# Patient Record
Sex: Male | Born: 1940 | Race: Black or African American | Hispanic: No | Marital: Married | Smoking: Never smoker
Health system: Southern US, Community
[De-identification: ages and names within clinical notes are randomized; demographics above are authoritative.]

## PROBLEM LIST (undated history)

## (undated) DIAGNOSIS — I517 Cardiomegaly: Secondary | ICD-10-CM

## (undated) DIAGNOSIS — R011 Cardiac murmur, unspecified: Secondary | ICD-10-CM

## (undated) DIAGNOSIS — K219 Gastro-esophageal reflux disease without esophagitis: Secondary | ICD-10-CM

## (undated) DIAGNOSIS — M1990 Unspecified osteoarthritis, unspecified site: Secondary | ICD-10-CM

## (undated) DIAGNOSIS — I10 Essential (primary) hypertension: Secondary | ICD-10-CM

## (undated) DIAGNOSIS — I5189 Other ill-defined heart diseases: Secondary | ICD-10-CM

## (undated) DIAGNOSIS — I340 Nonrheumatic mitral (valve) insufficiency: Secondary | ICD-10-CM

## (undated) DIAGNOSIS — I371 Nonrheumatic pulmonary valve insufficiency: Secondary | ICD-10-CM

## (undated) DIAGNOSIS — M109 Gout, unspecified: Secondary | ICD-10-CM

## (undated) DIAGNOSIS — E785 Hyperlipidemia, unspecified: Secondary | ICD-10-CM

## (undated) DIAGNOSIS — R251 Tremor, unspecified: Secondary | ICD-10-CM

## (undated) HISTORY — DX: Essential (primary) hypertension: I10

## (undated) HISTORY — DX: Hyperlipidemia, unspecified: E78.5

## (undated) HISTORY — DX: Tremor, unspecified: R25.1

## (undated) HISTORY — DX: Nonrheumatic mitral (valve) insufficiency: I34.0

## (undated) HISTORY — DX: Cardiomegaly: I51.7

## (undated) HISTORY — PX: ANAL FISSURECTOMY: SUR608

## (undated) HISTORY — DX: Gout, unspecified: M10.9

## (undated) HISTORY — DX: Unspecified osteoarthritis, unspecified site: M19.90

## (undated) HISTORY — DX: Nonrheumatic pulmonary valve insufficiency: I37.1

## (undated) HISTORY — DX: Other ill-defined heart diseases: I51.89

## (undated) HISTORY — DX: Cardiac murmur, unspecified: R01.1

---

## 2012-01-08 ENCOUNTER — Ambulatory Visit: Payer: Self-pay | Admitting: Family Medicine

## 2012-01-08 LAB — CREATININE, SERUM
Creatinine: 1 mg/dL (ref 0.60–1.30)
EGFR (African American): >60
EGFR (Non-African Amer.): >60

## 2013-07-31 ENCOUNTER — Ambulatory Visit (INDEPENDENT_AMBULATORY_CARE_PROVIDER_SITE_OTHER): Payer: Medicare Other | Admitting: Podiatry

## 2013-07-31 ENCOUNTER — Encounter: Payer: Self-pay | Admitting: Podiatry

## 2013-07-31 VITALS — BP 182/89 | HR 69 | Resp 16 | Ht 72.0 in | Wt 225.0 lb

## 2013-07-31 DIAGNOSIS — M7660 Achilles tendinitis, unspecified leg: Secondary | ICD-10-CM

## 2013-07-31 NOTE — Progress Notes (Signed)
Mr. Cansler presents today as a 72 year old very active black male for followup of his tendo Achilles tendinitis. He states that where we do we should not take him off the Voltaren gel. He states that this helps considerably the only side effects he gets is some sweating. He states that he has not tried the elliptical machine yet but he is very active at the gym. Denies any trauma. States that the Achilles is almost 100% better.  Objective: Vital signs are stable he is alert and oriented x3. Pulses remain palpable left lower extremity. No warmth and no tenderness on palpation of the tendo Achilles left heel. This is much improved.  Assessment: Well-healing Achilles tendinitis left.  Plan: Discussed etiology pathology conservative versus surgical therapies. He will continue the use of his new balance tennis shoes. He will continue the use of his Voltaren gel. He will followup with [place] on an as-needed basis.

## 2013-08-02 ENCOUNTER — Encounter: Payer: Self-pay | Admitting: Podiatry

## 2014-06-11 ENCOUNTER — Other Ambulatory Visit: Payer: Self-pay | Admitting: *Deleted

## 2014-06-11 MED ORDER — DICLOFENAC SODIUM 1 % TD GEL: 4.0000 g | Freq: Four times a day (QID) | TRANSDERMAL | Status: DC

## 2014-06-11 NOTE — Telephone Encounter (Signed)
Per dr Milinda Pointer refill voltaren gel 1% one tube with 1 refill apply to affected area 4 times daily.

## 2015-03-04 ENCOUNTER — Other Ambulatory Visit: Payer: Self-pay | Admitting: Emergency Medicine

## 2015-03-04 ENCOUNTER — Telehealth: Payer: Self-pay | Admitting: Family Medicine

## 2015-03-04 DIAGNOSIS — M5430 Sciatica, unspecified side: Secondary | ICD-10-CM

## 2015-03-04 MED ORDER — GABAPENTIN 300 MG PO CAPS: 300.0000 mg | ORAL_CAPSULE | Freq: Three times a day (TID) | ORAL | Status: DC

## 2015-03-04 NOTE — Telephone Encounter (Signed)
Ok to rf gabapentin for 3 mo. 1 rf

## 2015-03-04 NOTE — Telephone Encounter (Signed)
RX sent to mail order pharmacy with 3 refills

## 2015-03-04 NOTE — Telephone Encounter (Signed)
Look in allscript patient has not had Gabapentin in 1 year. Do you want to refill or will he need follow up appointment

## 2015-03-04 NOTE — Telephone Encounter (Signed)
Pt is requesting a refill on gabapentin 300mg . He would like it to be sent into [facility].

## 2015-04-08 ENCOUNTER — Ambulatory Visit: Payer: Self-pay | Admitting: Family Medicine

## 2015-05-13 ENCOUNTER — Other Ambulatory Visit: Payer: Self-pay | Admitting: Family Medicine

## 2015-07-17 ENCOUNTER — Other Ambulatory Visit: Payer: Self-pay | Admitting: Family Medicine

## 2015-07-26 ENCOUNTER — Other Ambulatory Visit: Payer: Self-pay | Admitting: Family Medicine

## 2015-10-25 ENCOUNTER — Other Ambulatory Visit: Payer: Self-pay | Admitting: Family Medicine

## 2015-11-08 ENCOUNTER — Other Ambulatory Visit: Payer: Self-pay | Admitting: Family Medicine

## 2015-11-08 ENCOUNTER — Telehealth: Payer: Self-pay | Admitting: Family Medicine

## 2015-11-08 NOTE — Telephone Encounter (Signed)
NEEDS GAYBAPENTIN REFILLED. TOLD HEM HE MAY HAVE TO BE SEEN FIRST SINCE HE HAS NOT BEEN HERE SINCE 03/2015. PHARM IS [facility].

## 2015-11-11 MED ORDER — GABAPENTIN 300 MG PO CAPS: 300.0000 mg | ORAL_CAPSULE | Freq: Three times a day (TID) | ORAL | Status: DC

## 2015-11-11 NOTE — Telephone Encounter (Signed)
LFT MESS TO CALL ABOUT MEDICATION AND TO [facility] AN APPT WITH MORRISEY WHEN HE RETURNS

## 2015-11-11 NOTE — Telephone Encounter (Signed)
Pt coming in on April 3 for appt.

## 2015-11-11 NOTE — Telephone Encounter (Signed)
Refill has been sent to pharmacy. Please schedule follow up appointment for when [facility] returns

## 2015-11-11 NOTE — Telephone Encounter (Signed)
We can give one month supply until he can return for follow up

## 2015-12-20 ENCOUNTER — Other Ambulatory Visit: Payer: Self-pay | Admitting: Family Medicine

## 2015-12-20 MED ORDER — GABAPENTIN 300 MG PO CAPS: 300.0000 mg | ORAL_CAPSULE | Freq: Three times a day (TID) | ORAL | Status: DC

## 2015-12-20 NOTE — Telephone Encounter (Signed)
Dr Rutherford Nail Patient: requesting refill on Gabapentin 300mg  please send to [facility] [address]. Patient has only one day left.

## 2015-12-20 NOTE — Telephone Encounter (Signed)
Refill request was sent to Dr. Krichna Sowles for approval and submission.  

## 2015-12-23 ENCOUNTER — Other Ambulatory Visit: Payer: Self-pay | Admitting: Family Medicine

## 2015-12-23 ENCOUNTER — Ambulatory Visit: Payer: Self-pay | Admitting: Family Medicine

## 2015-12-23 ENCOUNTER — Other Ambulatory Visit: Payer: Self-pay

## 2015-12-23 MED ORDER — GABAPENTIN 300 MG PO CAPS: 300.0000 mg | ORAL_CAPSULE | Freq: Three times a day (TID) | ORAL | Status: DC

## 2015-12-25 ENCOUNTER — Ambulatory Visit (INDEPENDENT_AMBULATORY_CARE_PROVIDER_SITE_OTHER): Payer: Medicare Other | Admitting: Family Medicine

## 2015-12-25 ENCOUNTER — Encounter: Payer: Self-pay | Admitting: Family Medicine

## 2015-12-25 VITALS — BP 160/78 | HR 67 | Temp 98.2°F | Resp 18 | Ht 72.0 in | Wt 238.4 lb

## 2015-12-25 DIAGNOSIS — M5441 Lumbago with sciatica, right side: Secondary | ICD-10-CM | POA: Diagnosis not present

## 2015-12-25 DIAGNOSIS — G8929 Other chronic pain: Secondary | ICD-10-CM

## 2015-12-25 DIAGNOSIS — I10 Essential (primary) hypertension: Secondary | ICD-10-CM | POA: Diagnosis not present

## 2015-12-25 MED ORDER — GABAPENTIN 300 MG PO CAPS: 300.0000 mg | ORAL_CAPSULE | Freq: Three times a day (TID) | ORAL | Status: DC

## 2015-12-25 MED ORDER — LOSARTAN POTASSIUM 100 MG PO TABS: 100.0000 mg | ORAL_TABLET | Freq: Every day | ORAL | Status: DC

## 2015-12-25 MED ORDER — AMLODIPINE BESYLATE 2.5 MG PO TABS: 2.5000 mg | ORAL_TABLET | Freq: Every day | ORAL | Status: DC

## 2015-12-25 NOTE — Progress Notes (Signed)
Name: Jesus Dillon   MRN: [id number]    DOB: 20-Jun-1941   Date:12/25/2015       Progress Note  Subjective  Chief Complaint  Chief Complaint  Patient presents with  . Medication Refill  . Back Pain    Takes Gabapentin for sciatica and it has helped.     HPI  Low Back Pain: Pt. Presents for medication refill. He is on Gabapentin for chronic right low back pain with radiation down the right leg. He was started on Gabapentin 300 mg three times daily by Dr. Rutherford Nail, which usually helps relieve the pain.  Review his records including MRI of lumbar spine from 2010 reviewed and discussed with patient in detail.    Past Medical History  Diagnosis Date  . HBP (high blood pressure)   . Gout   . Tremors of nervous system     No past surgical history on file.  No family history on file.  Social History   Social History  . Marital Status: Married    Spouse Name: N/A  . Number of Children: N/A  . Years of Education: N/A   Occupational History  . Not on file.   Social History Main Topics  . Smoking status: Never Smoker   . Smokeless tobacco: Never Used  . Alcohol Use: No  . Drug Use: No  . Sexual Activity:    Partners: Female   Other Topics Concern  . Not on file   Social History Narrative     Current outpatient prescriptions:  .  allopurinol (ZYLOPRIM) 300 MG tablet, TAKE 1 TABLET DAILY, Disp: 90 tablet, Rfl: 1 .  atorvastatin (LIPITOR) 10 MG tablet, 10 mg. TAKE ONE TABLET EVERY OTHER DAY, Disp: , Rfl:  .  diclofenac sodium (VOLTAREN) 1 % GEL, Apply 4 g topically 4 (four) times daily., Disp: 1 Tube, Rfl: 11 .  gabapentin (NEURONTIN) 300 MG capsule, Take 1 capsule (300 mg total) by mouth 3 (three) times daily., Disp: 90 capsule, Rfl: 2 .  losartan (COZAAR) 100 MG tablet, Take 1 tablet (100 mg total) by mouth daily., Disp: 90 tablet, Rfl: 0 .  propranolol (INDERAL) 10 MG tablet, TAKE 1 TABLET DAILY, Disp: 90 tablet, Rfl: 0 .  amLODipine (NORVASC) 2.5 MG tablet, Take 1  tablet (2.5 mg total) by mouth daily., Disp: 30 tablet, Rfl: 0  Allergies  Allergen Reactions  . Penicillins Rash     Review of Systems  Eyes: Negative for blurred vision and double vision.  Cardiovascular: Negative for chest pain.  Musculoskeletal: Positive for back pain and joint pain (right knee pain).  Neurological: Positive for dizziness (occasional dizziness especially on standing up too fast.). Negative for headaches.     Objective  Filed Vitals:   12/25/15 1428 12/25/15 1451  BP: 174/78 160/78  Pulse: 67   Temp: 98.2 F (36.8 C)   TempSrc: Oral   Resp: 18   Height: 6' (1.829 m)   Weight: 238 lb 6.4 oz (108.138 kg)   SpO2: 98%     Physical Exam  Constitutional: He is oriented to person, place, and time and well-developed, well-nourished, and in no distress.  Cardiovascular: Normal rate and regular rhythm.   Pulmonary/Chest: Effort normal and breath sounds normal.  Musculoskeletal:       Lumbar back: He exhibits tenderness and pain.       Back:  Neurological: He is alert and oriented to person, place, and time.  Nursing note and vitals reviewed.  Assessment & Plan  1. Essential hypertension Blood pressure is persistently elevated, will add amlodipine to his regimen. Recheck BP in one month - amLODipine (NORVASC) 2.5 MG tablet; Take 1 tablet (2.5 mg total) by mouth daily.  Dispense: 30 tablet; Refill: 0  2. Chronic right-sided low back pain with right-sided sciatica Refill for Gabapentin  to be taken 3 times daily as needed. - gabapentin (NEURONTIN) 300 MG capsule; Take 1 capsule (300 mg total) by mouth 3 (three) times daily.  Dispense: 90 capsule; Refill: 2   Sabrena Gavitt Asad A. [facility] 12/25/2015 6:12 PM

## 2016-01-22 ENCOUNTER — Other Ambulatory Visit: Payer: Self-pay | Admitting: Family Medicine

## 2016-01-26 ENCOUNTER — Other Ambulatory Visit: Payer: Self-pay | Admitting: Family Medicine

## 2016-01-27 ENCOUNTER — Ambulatory Visit (INDEPENDENT_AMBULATORY_CARE_PROVIDER_SITE_OTHER): Payer: Medicare Other | Admitting: Family Medicine

## 2016-01-27 ENCOUNTER — Other Ambulatory Visit: Payer: Self-pay | Admitting: Family Medicine

## 2016-01-27 ENCOUNTER — Encounter: Payer: Self-pay | Admitting: Family Medicine

## 2016-01-27 VITALS — BP 150/84 | HR 65 | Temp 98.7°F | Resp 18 | Ht 72.0 in | Wt 237.2 lb

## 2016-01-27 DIAGNOSIS — I10 Essential (primary) hypertension: Secondary | ICD-10-CM

## 2016-01-27 MED ORDER — AMLODIPINE BESYLATE 2.5 MG PO TABS: 2.5000 mg | ORAL_TABLET | Freq: Every day | ORAL | Status: DC

## 2016-01-27 NOTE — Progress Notes (Signed)
Name: Jesus Dillon   MRN: [id number]    DOB: 1941-06-29   Date:01/27/2016       Progress Note  Subjective  Chief Complaint  Chief Complaint  Patient presents with  . Hypertension    1 month follow up    Hypertension This is a chronic problem. The problem is unchanged. The problem is uncontrolled. Pertinent negatives include no blurred vision, chest pain, headaches, palpitations or shortness of breath. Past treatments include calcium channel blockers and angiotensin blockers (Not taking Losartan 100 mg because he thought that  AMlodipine was a replacement for Losartan instead of add-on therapy.Marland Kitchen). There is no history of kidney disease, CAD/MI or CVA.    Past Medical History  Diagnosis Date  . HBP (high blood pressure)   . Gout   . Tremors of nervous system   . Hyperlipidemia     History reviewed. No pertinent past surgical history.  History reviewed. No pertinent family history.  Social History   Social History  . Marital Status: Married    Spouse Name: N/A  . Number of Children: N/A  . Years of Education: N/A   Occupational History  . Not on file.   Social History Main Topics  . Smoking status: Never Smoker   . Smokeless tobacco: Never Used  . Alcohol Use: No  . Drug Use: No  . Sexual Activity:    Partners: Female   Other Topics Concern  . Not on file   Social History Narrative     Current outpatient prescriptions:  .  allopurinol (ZYLOPRIM) 300 MG tablet, TAKE 1 TABLET DAILY, Disp: 90 tablet, Rfl: 0 .  amLODipine (NORVASC) 2.5 MG tablet, Take 1 tablet (2.5 mg total) by mouth daily., Disp: 30 tablet, Rfl: 0 .  atorvastatin (LIPITOR) 10 MG tablet, 10 mg. TAKE ONE TABLET EVERY OTHER DAY, Disp: , Rfl:  .  diclofenac sodium (VOLTAREN) 1 % GEL, Apply 4 g topically 4 (four) times daily., Disp: 1 Tube, Rfl: 11 .  gabapentin (NEURONTIN) 300 MG capsule, Take 1 capsule (300 mg total) by mouth 3 (three) times daily., Disp: 90 capsule, Rfl: 2 .  losartan (COZAAR) 100 MG  tablet, Take 1 tablet (100 mg total) by mouth daily., Disp: 90 tablet, Rfl: 0 .  propranolol (INDERAL) 10 MG tablet, TAKE 1 TABLET DAILY, Disp: 90 tablet, Rfl: 0  Allergies  Allergen Reactions  . Penicillins Rash    Review of Systems  Eyes: Negative for blurred vision.  Respiratory: Negative for shortness of breath.   Cardiovascular: Negative for chest pain and palpitations.  Neurological: Negative for headaches.     Objective  Filed Vitals:   01/27/16 1308  BP: 150/84  Pulse: 65  Temp: 98.7 F (37.1 C)  TempSrc: Oral  Resp: 18  Height: 6' (1.829 m)  Weight: 237 lb 3.2 oz (107.593 kg)  SpO2: 97%    Physical Exam  Constitutional: He is oriented to person, place, and time and well-developed, well-nourished, and in no distress.  HENT:  Head: Normocephalic and atraumatic.  Cardiovascular: Normal rate and regular rhythm.   Pulmonary/Chest: Effort normal and breath sounds normal.  Neurological: He is alert and oriented to person, place, and time.  Nursing note and vitals reviewed.     Assessment & Plan  1. Essential hypertension Blood pressure is improved but patient had a misunderstanding where in he thought amlodipine was a replacement for losartan and consequently stopped taking losartan. Advised to restart losartan 100 mg daily, continue on amlodipine and recheck  BP in one month. Verbalized acknowledgment. - amLODipine (NORVASC) 2.5 MG tablet; Take 1 tablet (2.5 mg total) by mouth daily.  Dispense: 30 tablet; Refill: 2   Korban Shearer Asad A. [facility] 01/27/2016 1:29 PM

## 2016-02-04 ENCOUNTER — Other Ambulatory Visit: Payer: Self-pay | Admitting: Family Medicine

## 2016-02-06 ENCOUNTER — Other Ambulatory Visit: Payer: Self-pay | Admitting: Family Medicine

## 2016-02-27 ENCOUNTER — Encounter: Payer: Self-pay | Admitting: Family Medicine

## 2016-02-27 ENCOUNTER — Ambulatory Visit (INDEPENDENT_AMBULATORY_CARE_PROVIDER_SITE_OTHER): Payer: Medicare Other | Admitting: Family Medicine

## 2016-02-27 VITALS — BP 137/81 | HR 67 | Temp 98.1°F | Resp 16 | Ht 72.0 in | Wt 234.7 lb

## 2016-02-27 DIAGNOSIS — R011 Cardiac murmur, unspecified: Secondary | ICD-10-CM | POA: Diagnosis not present

## 2016-02-27 DIAGNOSIS — R739 Hyperglycemia, unspecified: Secondary | ICD-10-CM | POA: Diagnosis not present

## 2016-02-27 DIAGNOSIS — E785 Hyperlipidemia, unspecified: Secondary | ICD-10-CM

## 2016-02-27 DIAGNOSIS — I10 Essential (primary) hypertension: Secondary | ICD-10-CM | POA: Diagnosis not present

## 2016-02-27 LAB — POCT GLYCOSYLATED HEMOGLOBIN (HGB A1C): HEMOGLOBIN A1C: 6.1

## 2016-02-27 LAB — GLUCOSE, POCT (MANUAL RESULT ENTRY): POC Glucose: 84 mg/dl (ref 70–99)

## 2016-02-27 MED ORDER — LOSARTAN POTASSIUM 100 MG PO TABS: 100.0000 mg | ORAL_TABLET | Freq: Every day | ORAL | Status: DC

## 2016-02-27 MED ORDER — AMLODIPINE BESYLATE 2.5 MG PO TABS: 2.5000 mg | ORAL_TABLET | Freq: Every day | ORAL | Status: DC

## 2016-02-27 NOTE — Progress Notes (Signed)
Name: Jesus Dillon   MRN: [id number]    DOB: 06-23-1941   Date:02/27/2016       Progress Note  Subjective  Chief Complaint  Chief Complaint  Patient presents with  . Follow-up    1 mo    Hypertension This is a chronic problem. The problem is controlled. Pertinent negatives include no blurred vision, chest pain, headaches, palpitations or shortness of breath. Past treatments include angiotensin blockers and calcium channel blockers. There is no history of kidney disease, CAD/MI or CVA.  Hyperlipidemia This is a chronic problem. The problem is uncontrolled. Recent lipid tests were reviewed and are high. Pertinent negatives include no chest pain, leg pain, myalgias or shortness of breath. Current antihyperlipidemic treatment includes statins (Taking Atorvastatin qOD.).     Past Medical History  Diagnosis Date  . HBP (high blood pressure)   . Gout   . Tremors of nervous system   . Hyperlipidemia     History reviewed. No pertinent past surgical history.  History reviewed. No pertinent family history.  Social History   Social History  . Marital Status: Married    Spouse Name: N/A  . Number of Children: N/A  . Years of Education: N/A   Occupational History  . Not on file.   Social History Main Topics  . Smoking status: Never Smoker   . Smokeless tobacco: Never Used  . Alcohol Use: No  . Drug Use: No  . Sexual Activity:    Partners: Female   Other Topics Concern  . Not on file   Social History Narrative     Current outpatient prescriptions:  .  allopurinol (ZYLOPRIM) 300 MG tablet, TAKE 1 TABLET DAILY, Disp: 90 tablet, Rfl: 0 .  amLODipine (NORVASC) 2.5 MG tablet, Take 1 tablet (2.5 mg total) by mouth daily., Disp: 30 tablet, Rfl: 2 .  atorvastatin (LIPITOR) 10 MG tablet, 10 mg. TAKE ONE TABLET EVERY OTHER DAY, Disp: , Rfl:  .  gabapentin (NEURONTIN) 300 MG capsule, TAKE ONE CAPSULE BY MOUTH THREE TIMES DAILY, Disp: 270 capsule, Rfl: 2 .  losartan (COZAAR) 100 MG  tablet, TAKE 1 TABLET DAILY, Disp: 90 tablet, Rfl: 0 .  propranolol (INDERAL) 10 MG tablet, TAKE 1 TABLET DAILY, Disp: 90 tablet, Rfl: 0 .  diclofenac sodium (VOLTAREN) 1 % GEL, Apply 4 g topically 4 (four) times daily. (Patient not taking: Reported on 02/27/2016), Disp: 1 Tube, Rfl: 11  Allergies  Allergen Reactions  . Penicillins Rash     Review of Systems  Eyes: Negative for blurred vision.  Respiratory: Negative for shortness of breath.   Cardiovascular: Negative for chest pain and palpitations.  Musculoskeletal: Negative for myalgias.  Neurological: Negative for headaches.    Objective  Filed Vitals:   02/27/16 1330  BP: 137/81  Pulse: 67  Temp: 98.1 F (36.7 C)  TempSrc: Oral  Resp: 16  Height: 6' (1.829 m)  Weight: 234 lb 11.2 oz (106.459 kg)  SpO2: 99%    Physical Exam  Constitutional: He is well-developed, well-nourished, and in no distress.  HENT:  Head: Normocephalic and atraumatic.  Cardiovascular: Normal rate, regular rhythm, S1 normal and S2 normal.   Murmur heard.  Systolic murmur is present with a grade of 2/6  Systolic murmur best heard at the Aortic area.   Pulmonary/Chest: Breath sounds normal. He has no decreased breath sounds.  Abdominal: Soft. Bowel sounds are normal. There is no tenderness.  Musculoskeletal:       Right ankle: He exhibits no swelling.  Left ankle: He exhibits no swelling.  Nursing note and vitals reviewed.     Assessment & Plan  1. Essential hypertension Blood Pressure stable and responsive to and hypertensive therapy - amLODipine (NORVASC) 2.5 MG tablet; Take 1 tablet (2.5 mg total) by mouth daily.  Dispense: 90 tablet; Refill: 1 - losartan (COZAAR) 100 MG tablet; Take 1 tablet (100 mg total) by mouth daily.  Dispense: 90 tablet; Refill: 1  2. Hyperlipidemia Obtain FLP, consider medication adjustment. - Lipid Profile - Comprehensive Metabolic Panel (CMET)  3. Hyperglycemia  - POCT HgB A1C - POCT Glucose  (CBG)  4. Cardiac murmur Patient has a known history of cardiac murmur, has been evaluated in the past by a cardiologist. Reassured   [place] 02/27/2016 1:37 PM

## 2016-03-06 ENCOUNTER — Telehealth: Payer: Self-pay

## 2016-03-06 DIAGNOSIS — E785 Hyperlipidemia, unspecified: Secondary | ICD-10-CM

## 2016-03-06 MED ORDER — ATORVASTATIN CALCIUM 10 MG PO TABS: 10.0000 mg | ORAL_TABLET | ORAL | Status: DC

## 2016-03-06 NOTE — Telephone Encounter (Signed)
Pt stated that he saw you recently and forgot to ask for a refill for his atorvastatin. [facility]

## 2016-03-06 NOTE — Telephone Encounter (Signed)
Prescription for atorvastatin has been sent to pharmacy. Please confirm if patient is taking medication every other day and also recommend that he should obtain fasting lipid panel as soon as possible. Thank you

## 2016-04-03 ENCOUNTER — Telehealth: Payer: Self-pay | Admitting: Family Medicine

## 2016-04-03 DIAGNOSIS — R251 Tremor, unspecified: Secondary | ICD-10-CM | POA: Insufficient documentation

## 2016-04-03 MED ORDER — PROPRANOLOL HCL 10 MG PO TABS: 10.0000 mg | ORAL_TABLET | Freq: Every day | ORAL | Status: DC

## 2016-04-03 NOTE — Telephone Encounter (Signed)
Pt needs refill on Propranolol to be sent to [facility].

## 2016-04-03 NOTE — Telephone Encounter (Signed)
Prescription sent to patient's pharmacy.

## 2016-04-03 NOTE — Telephone Encounter (Signed)
LMOM to inform pt °

## 2016-04-20 ENCOUNTER — Other Ambulatory Visit: Payer: Self-pay | Admitting: Family Medicine

## 2016-04-20 DIAGNOSIS — M5441 Lumbago with sciatica, right side: Principal | ICD-10-CM

## 2016-04-20 DIAGNOSIS — G8929 Other chronic pain: Secondary | ICD-10-CM

## 2016-04-22 ENCOUNTER — Other Ambulatory Visit: Payer: Self-pay | Admitting: Family Medicine

## 2016-04-29 NOTE — Telephone Encounter (Signed)
Called and spoke to wife and informed her pt needs an appt. She states she will inform him

## 2016-05-05 ENCOUNTER — Telehealth: Payer: Self-pay | Admitting: Family Medicine

## 2016-05-05 MED ORDER — ALLOPURINOL 300 MG PO TABS: 300.0000 mg | ORAL_TABLET | Freq: Every day | ORAL | 0 refills | Status: DC

## 2016-05-05 NOTE — Telephone Encounter (Signed)
THE PATIENT SAID THAT HE WAS HERE TODAY (05-05-16) AND HAD LABS DONE. WAS THOSE LABS FOR THE URIC ACID LEVEL?

## 2016-05-05 NOTE — Telephone Encounter (Signed)
No those labs were for lipid panel and comprehensive metabolic panel and did not include uric acid level. He will need a separate appointment

## 2016-05-05 NOTE — Telephone Encounter (Signed)
Prescription for allopurinol is sent to patient's pharmacy. He should schedule an appointment to check uric acid level

## 2016-05-06 LAB — COMPREHENSIVE METABOLIC PANEL
A/G RATIO: 1.3 (ref 1.2–2.2)
ALK PHOS: 52 IU/L (ref 39–117)
ALT: 24 IU/L (ref 0–44)
AST: 26 IU/L (ref 0–40)
Albumin: 4.5 g/dL (ref 3.5–4.8)
BILIRUBIN TOTAL: 0.9 mg/dL (ref 0.0–1.2)
BUN/Creatinine Ratio: 13 (ref 10–24)
BUN: 13 mg/dL (ref 8–27)
CHLORIDE: 101 mmol/L (ref 96–106)
CO2: 22 mmol/L (ref 18–29)
Calcium: 9.4 mg/dL (ref 8.6–10.2)
Creatinine, Ser: 1.04 mg/dL (ref 0.76–1.27)
GFR calc non Af Amer: 71 mL/min/{1.73_m2} (ref >59)
GFR, EST AFRICAN AMERICAN: 82 mL/min/{1.73_m2} (ref >59)
Globulin, Total: 3.4 g/dL (ref 1.5–4.5)
Glucose: 101 mg/dL — ABNORMAL HIGH (ref 65–99)
POTASSIUM: 4.3 mmol/L (ref 3.5–5.2)
Sodium: 138 mmol/L (ref 134–144)
Total Protein: 7.9 g/dL (ref 6.0–8.5)

## 2016-05-06 LAB — LIPID PANEL
CHOL/HDL RATIO: 3.3 ratio (ref 0.0–5.0)
Cholesterol, Total: 170 mg/dL (ref 100–199)
HDL: 52 mg/dL (ref >39)
LDL CALC: 109 mg/dL — AB (ref 0–99)
TRIGLYCERIDES: 47 mg/dL (ref 0–149)
VLDL Cholesterol Cal: 9 mg/dL (ref 5–40)

## 2016-05-06 NOTE — Telephone Encounter (Signed)
lft message with wife on 05-05-16 and left message on machine today 05-06-16 stating that needs appt for labs for uric acid level.

## 2016-05-14 ENCOUNTER — Telehealth: Payer: Self-pay | Admitting: Family Medicine

## 2016-05-14 DIAGNOSIS — E785 Hyperlipidemia, unspecified: Secondary | ICD-10-CM

## 2016-05-14 MED ORDER — ATORVASTATIN CALCIUM 10 MG PO TABS: 10.0000 mg | ORAL_TABLET | ORAL | 0 refills | Status: DC

## 2016-05-14 NOTE — Telephone Encounter (Signed)
Prescription for atorvastatin has been sent to patient's pharmacy

## 2016-05-17 ENCOUNTER — Other Ambulatory Visit: Payer: Self-pay | Admitting: Family Medicine

## 2016-05-17 DIAGNOSIS — E785 Hyperlipidemia, unspecified: Secondary | ICD-10-CM

## 2016-05-19 ENCOUNTER — Ambulatory Visit (INDEPENDENT_AMBULATORY_CARE_PROVIDER_SITE_OTHER): Payer: Medicare Other | Admitting: Family Medicine

## 2016-05-19 ENCOUNTER — Encounter: Payer: Self-pay | Admitting: Family Medicine

## 2016-05-19 VITALS — BP 128/75 | HR 68 | Temp 98.7°F | Resp 15 | Ht 72.0 in | Wt 241.0 lb

## 2016-05-19 DIAGNOSIS — I10 Essential (primary) hypertension: Secondary | ICD-10-CM

## 2016-05-19 DIAGNOSIS — E785 Hyperlipidemia, unspecified: Secondary | ICD-10-CM

## 2016-05-19 DIAGNOSIS — M1A4720 Other secondary chronic gout, left ankle and foot, without tophus (tophi): Secondary | ICD-10-CM | POA: Diagnosis not present

## 2016-05-19 DIAGNOSIS — M1A9XX0 Chronic gout, unspecified, without tophus (tophi): Secondary | ICD-10-CM | POA: Insufficient documentation

## 2016-05-19 MED ORDER — LOSARTAN POTASSIUM 100 MG PO TABS: 100.0000 mg | ORAL_TABLET | Freq: Every day | ORAL | 1 refills | Status: DC

## 2016-05-19 MED ORDER — ATORVASTATIN CALCIUM 20 MG PO TABS: 20.0000 mg | ORAL_TABLET | Freq: Every day | ORAL | 1 refills | Status: DC

## 2016-05-19 NOTE — Progress Notes (Signed)
Name: Jesus Dillon   MRN: [id number]    DOB: 12-11-1940   Date:05/19/2016       Progress Note  Subjective  Chief Complaint  Chief Complaint  Patient presents with  . Medication Refill  . Labs Only    Check uric acid levels    Hypertension  This is a chronic problem. The problem is unchanged. The problem is controlled. Pertinent negatives include no blurred vision, chest pain, headaches, palpitations or shortness of breath. Past treatments include calcium channel blockers and angiotensin blockers. There is no history of kidney disease, CAD/MI or CVA.  Hyperlipidemia  This is a chronic problem. The problem is uncontrolled. Recent lipid tests were reviewed and are high. Pertinent negatives include no chest pain, myalgias or shortness of breath. Current antihyperlipidemic treatment includes statins. The current treatment provides moderate improvement of lipids. There are no compliance problems.       Past Medical History:  Diagnosis Date  . Gout   . HBP (high blood pressure)   . Hyperlipidemia   . Tremors of nervous system     History reviewed. No pertinent surgical history.  History reviewed. No pertinent family history.  Social History   Social History  . Marital status: Married    Spouse name: N/A  . Number of children: N/A  . Years of education: N/A   Occupational History  . Not on file.   Social History Main Topics  . Smoking status: Never Smoker  . Smokeless tobacco: Never Used  . Alcohol use No  . Drug use: No  . Sexual activity: Yes    Partners: Female   Other Topics Concern  . Not on file   Social History Narrative  . No narrative on file     Current Outpatient Prescriptions:  .  allopurinol (ZYLOPRIM) 300 MG tablet, Take 1 tablet (300 mg total) by mouth daily., Disp: 90 tablet, Rfl: 0 .  amLODipine (NORVASC) 2.5 MG tablet, Take 1 tablet (2.5 mg total) by mouth daily., Disp: 90 tablet, Rfl: 1 .  atorvastatin (LIPITOR) 10 MG tablet, Take 1 tablet (10 mg  total) by mouth every other day. TAKE ONE TABLET EVERY OTHER DAY, Disp: 45 tablet, Rfl: 0 .  atorvastatin (LIPITOR) 10 MG tablet, Take 1 tablet (10 mg total) by mouth daily at 6 PM., Disp: 90 tablet, Rfl: 0 .  diclofenac sodium (VOLTAREN) 1 % GEL, Apply 4 g topically 4 (four) times daily., Disp: 1 Tube, Rfl: 11 .  gabapentin (NEURONTIN) 300 MG capsule, TAKE ONE CAPSULE BY MOUTH THREE TIMES DAILY, Disp: 270 capsule, Rfl: 0 .  losartan (COZAAR) 100 MG tablet, Take 1 tablet (100 mg total) by mouth daily., Disp: 90 tablet, Rfl: 1 .  propranolol (INDERAL) 10 MG tablet, Take 1 tablet (10 mg total) by mouth daily., Disp: 90 tablet, Rfl: 1  Allergies  Allergen Reactions  . Penicillins Rash     Review of Systems  Eyes: Negative for blurred vision.  Respiratory: Negative for shortness of breath.   Cardiovascular: Negative for chest pain and palpitations.  Musculoskeletal: Negative for myalgias.  Neurological: Negative for headaches.     Objective  Vitals:   05/19/16 1344  BP: 128/75  Pulse: 68  Resp: 15  Temp: 98.7 F (37.1 C)  TempSrc: Oral  SpO2: 96%  Weight: 241 lb (109.3 kg)  Height: 6' (1.829 m)    Physical Exam  Constitutional: He is oriented to person, place, and time and well-developed, well-nourished, and in no distress.  HENT:  Head: Normocephalic and atraumatic.  Cardiovascular: Normal rate, regular rhythm, S1 normal, S2 normal and normal heart sounds.   No murmur heard. Pulmonary/Chest: Effort normal and breath sounds normal. He has no decreased breath sounds. He has no wheezes.  Abdominal: Soft. Bowel sounds are normal. There is no tenderness. There is no CVA tenderness.  Musculoskeletal:       Right ankle: He exhibits no swelling.       Left ankle: He exhibits no swelling.  Neurological: He is alert and oriented to person, place, and time.  Psychiatric: Mood, memory, affect and judgment normal.  Nursing note and vitals reviewed.    Assessment & Plan  1.  Hyperlipidemia Increased atorvastatin from 10 mg to 20 mg at bedtime, FLP reviewed with elevated LDL. Dyspnea with dietary compliance. - atorvastatin (LIPITOR) 20 MG tablet; Take 1 tablet (20 mg total) by mouth daily at 6 PM.  Dispense: 90 tablet; Refill: 1  2. Essential hypertension  - losartan (COZAAR) 100 MG tablet; Take 1 tablet (100 mg total) by mouth daily.  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 [facility] 05/19/2016 1:51 PM

## 2016-05-20 LAB — URIC ACID: URIC ACID, SERUM: 4.1 mg/dL (ref 4.0–8.0)

## 2016-07-01 ENCOUNTER — Ambulatory Visit (INDEPENDENT_AMBULATORY_CARE_PROVIDER_SITE_OTHER): Payer: Medicare Other

## 2016-07-01 ENCOUNTER — Ambulatory Visit (INDEPENDENT_AMBULATORY_CARE_PROVIDER_SITE_OTHER): Payer: Medicare Other | Admitting: Podiatry

## 2016-07-01 DIAGNOSIS — M7661 Achilles tendinitis, right leg: Secondary | ICD-10-CM

## 2016-07-01 NOTE — Patient Instructions (Signed)

## 2016-07-02 NOTE — Progress Notes (Signed)
Mr. Targett presents today with chief complaint of pain to his left Achilles. He states this then a proximally 6 weeks ago and he stepped incorrectly and her pop in his left heel. He states that when he is done that before usually goes away quite quickly however it has not gone away at this point. He states that mornings are particularly better anytime after he's been sitting for a while back up he experiences pain. He states the pain really does not subside that it seems to increase with time. He has been utilizing diclofenac gel which is used in the past for similar causes. He also relates that he has some pain along his right knee the medial aspect of her previous fall.  Objective: Vital signs are stable he is alert and oriented 3 have reviewed his past medical history medications allergies surgeries and social history. Pulses are strong and palpable. Neurologic sensorium is intact. Degenerative flexors are intact. Muscle strength is 5 over 5 dorsiflexion plantar flexors and inverters everters MUSCULATURE is intact. Orthopedic evaluation demonstrate all joints distal to the ankle have a full range of motion without crepitation. He does have pain on palpation from the posterior superior part of the calcaneus on palpation of the Achilles to its final insertion on the posterior inferior aspect of the calcaneus. It appears to be intact I do not feel any voids or deficiencies in the tendon itself radiographic evaluation 3 views of the left foot does demonstrate a relatively large posterior calcaneal heel spur which appears to have been avulsed as there are also small fragments of bone within the Achilles itself whether this is new or old calcification is hard to tell because it is so small. There does appear to be a fluid collection between the bone and the Achilles as well. Cutaneous evaluation today does not demonstrate any type of open wounds.  Assessment: Insertional Achilles tendinitis with probable  interstitial tear and avulsion of the Achilles and heel spur.  Plan: I encouraged him to continue to utilize the diclofenac gel. Also to utilize ice. I did place him in a Cam Walker and a night splint and I will follow-up with him in 4 weeks. If he has not improved significantly in 4 weeks' time and MRI will be indicated. This would be for surgical consideration.

## 2016-07-29 ENCOUNTER — Other Ambulatory Visit: Payer: Self-pay | Admitting: Family Medicine

## 2016-08-03 ENCOUNTER — Encounter: Payer: Self-pay | Admitting: Podiatry

## 2016-08-03 ENCOUNTER — Ambulatory Visit (INDEPENDENT_AMBULATORY_CARE_PROVIDER_SITE_OTHER): Payer: Medicare Other | Admitting: Podiatry

## 2016-08-03 DIAGNOSIS — M7661 Achilles tendinitis, right leg: Secondary | ICD-10-CM | POA: Diagnosis not present

## 2016-08-04 NOTE — Progress Notes (Signed)
He presents today for follow-up of his Achilles tendinitis right. He states that it is approximately 90% improved.  Objective: Vital signs are stable he is alert and oriented 3. He still has tenderness on palpation to the posterior aspect of the Achilles as insertion site distally. This is not warm to the touch and is minimally tender in comparison to what it was.  Assessment: Well healing Achilles tendinitis. I'm going encourage that he continue to wear the boot for another couple of weeks.  Plan: Encouraged him to wear the boot for another couple weeks I will allow him to start getting back into some water exercise.

## 2016-08-19 ENCOUNTER — Ambulatory Visit: Payer: Medicare Other | Admitting: Family Medicine

## 2016-08-27 ENCOUNTER — Ambulatory Visit (INDEPENDENT_AMBULATORY_CARE_PROVIDER_SITE_OTHER): Payer: Medicare Other | Admitting: Family Medicine

## 2016-08-27 ENCOUNTER — Encounter: Payer: Self-pay | Admitting: Family Medicine

## 2016-08-27 VITALS — BP 125/67 | HR 67 | Temp 98.7°F | Resp 16 | Ht 72.0 in | Wt 240.5 lb

## 2016-08-27 DIAGNOSIS — M5441 Lumbago with sciatica, right side: Secondary | ICD-10-CM | POA: Diagnosis not present

## 2016-08-27 DIAGNOSIS — G8929 Other chronic pain: Secondary | ICD-10-CM

## 2016-08-27 DIAGNOSIS — E785 Hyperlipidemia, unspecified: Secondary | ICD-10-CM

## 2016-08-27 DIAGNOSIS — I10 Essential (primary) hypertension: Secondary | ICD-10-CM

## 2016-08-27 DIAGNOSIS — R222 Localized swelling, mass and lump, trunk: Secondary | ICD-10-CM

## 2016-08-27 MED ORDER — GABAPENTIN 300 MG PO CAPS: 300.0000 mg | ORAL_CAPSULE | Freq: Three times a day (TID) | ORAL | 0 refills | Status: DC

## 2016-08-27 MED ORDER — AMLODIPINE BESYLATE 2.5 MG PO TABS: 2.5000 mg | ORAL_TABLET | Freq: Every day | ORAL | 1 refills | Status: DC

## 2016-08-27 NOTE — Progress Notes (Signed)
Name: Jesus Dillon   MRN: [id number]    DOB: 29-Apr-1941   Date:08/27/2016       Progress Note  Subjective  Chief Complaint  Chief Complaint  Patient presents with  . Follow-up    6 mo  . Medication Refill    Hypertension  This is a chronic problem. The problem is unchanged. The problem is controlled. Pertinent negatives include no blurred vision, chest pain, headaches, palpitations or shortness of breath. Past treatments include calcium channel blockers and angiotensin blockers. There is no history of kidney disease, CAD/MI or CVA.  Hyperlipidemia  This is a chronic problem. The problem is uncontrolled. Recent lipid tests were reviewed and are high. Pertinent negatives include no chest pain, myalgias or shortness of breath. Current antihyperlipidemic treatment includes statins. The current treatment provides moderate improvement of lipids. There are no compliance problems.    Mass on the Right Upper Back: Pt. Is concerned about a mass on his right upper back, slowly came on in the last two months and then started getting bigger. Not painful to touch. He feels otherwise well but wants to have it checked.   Past Medical History:  Diagnosis Date  . Gout   . HBP (high blood pressure)   . Hyperlipidemia   . Tremors of nervous system     History reviewed. No pertinent surgical history.  History reviewed. No pertinent family history.  Social History   Social History  . Marital status: Married    Spouse name: N/A  . Number of children: N/A  . Years of education: N/A   Occupational History  . Not on file.   Social History Main Topics  . Smoking status: Never Smoker  . Smokeless tobacco: Never Used  . Alcohol use No  . Drug use: No  . Sexual activity: Yes    Partners: Female   Other Topics Concern  . Not on file   Social History Narrative  . No narrative on file     Current Outpatient Prescriptions:  .  allopurinol (ZYLOPRIM) 300 MG tablet, TAKE 1 TABLET(300 MG) BY  MOUTH DAILY, Disp: 90 tablet, Rfl: 0 .  amLODipine (NORVASC) 2.5 MG tablet, Take 1 tablet (2.5 mg total) by mouth daily., Disp: 90 tablet, Rfl: 1 .  atorvastatin (LIPITOR) 20 MG tablet, Take 1 tablet (20 mg total) by mouth daily at 6 PM., Disp: 90 tablet, Rfl: 1 .  diclofenac sodium (VOLTAREN) 1 % GEL, Apply 4 g topically 4 (four) times daily., Disp: 1 Tube, Rfl: 11 .  gabapentin (NEURONTIN) 300 MG capsule, TAKE ONE CAPSULE BY MOUTH THREE TIMES DAILY, Disp: 270 capsule, Rfl: 0 .  losartan (COZAAR) 100 MG tablet, Take 1 tablet (100 mg total) by mouth daily., Disp: 90 tablet, Rfl: 1 .  propranolol (INDERAL) 10 MG tablet, Take 1 tablet (10 mg total) by mouth daily., Disp: 90 tablet, Rfl: 1  Allergies  Allergen Reactions  . Penicillins Rash    Review of Systems  Eyes: Negative for blurred vision.  Respiratory: Negative for shortness of breath.   Cardiovascular: Negative for chest pain and palpitations.  Musculoskeletal: Negative for myalgias.  Neurological: Negative for headaches.    Objective  Vitals:   08/27/16 1502  BP: 125/67  Pulse: 67  Resp: 16  Temp: 98.7 F (37.1 C)  TempSrc: Oral  SpO2: 98%  Weight: 240 lb 8 oz (109.1 kg)  Height: 6' (1.829 m)    Physical Exam  Constitutional: He is oriented to person, place, and time  and well-developed, well-nourished, and in no distress.  HENT:  Head: Normocephalic and atraumatic.  Cardiovascular: Normal rate, regular rhythm and normal heart sounds.   No murmur heard. Pulmonary/Chest: Effort normal and breath sounds normal. He has no wheezes.  Abdominal: Soft. Bowel sounds are normal.  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  Neurological: He is alert and oriented to person, place, and time.  Nursing note and vitals reviewed.     Assessment & Plan  1. Chronic right-sided low back pain with right-sided sciatica Stable and  responsive to gabapentin, refills provided - gabapentin (NEURONTIN) 300 MG capsule; Take 1 capsule (300 mg total) by mouth 3 (three) times daily.  Dispense: 270 capsule; Refill: 0  2. Essential hypertension  - amLODipine (NORVASC) 2.5 MG tablet; Take 1 tablet (2.5 mg total) by mouth daily.  Dispense: 90 tablet; Refill: 1  3. Hyperlipidemia, unspecified hyperlipidemia type Obtain updated FLP - Lipid Profile  4. Mass on back Explained that the mass is likely lipoma, it is decreasing in size which is reassuring. However recommended that to completely evaluate the size and other features, we will order a CT scan of the right upper back, patient wants to hold off at this time. Explained that if he starts having any concerning symptoms related to the mass, should contact us for further workup. Verbalized agreement   Syed Asad [facility] 08/27/2016 3:19 PM

## 2016-09-01 ENCOUNTER — Other Ambulatory Visit: Payer: Self-pay | Admitting: Family Medicine

## 2016-09-01 DIAGNOSIS — I10 Essential (primary) hypertension: Secondary | ICD-10-CM

## 2016-09-02 ENCOUNTER — Encounter: Payer: Self-pay | Admitting: Podiatry

## 2016-09-02 ENCOUNTER — Ambulatory Visit (INDEPENDENT_AMBULATORY_CARE_PROVIDER_SITE_OTHER): Payer: Medicare Other | Admitting: Podiatry

## 2016-09-02 DIAGNOSIS — M7661 Achilles tendinitis, right leg: Secondary | ICD-10-CM | POA: Diagnosis not present

## 2016-09-02 NOTE — Progress Notes (Signed)
Mr. Healan presents today for follow-up of his Achilles tendinitis left foot. States that he is approximately 99% improved.  Objective: Vital signs are stable he's alert and oriented 3 mild tenderness on palpation of the Achilles tendon at its insertion site posterior posterior medial aspect of the left foot.  Assessment: Pain in limb secondary to Achilles tendinitis left.  Plan: Follow up with me on an as-needed basis.

## 2016-09-07 ENCOUNTER — Telehealth: Payer: Self-pay | Admitting: Family Medicine

## 2016-09-07 NOTE — Telephone Encounter (Signed)
Spoke with patient and he will call back to schedule the annual wellness exam

## 2016-09-07 NOTE — Telephone Encounter (Signed)
Called Pt to schedule AWV with [facility] - knb °

## 2016-10-06 ENCOUNTER — Telehealth: Payer: Self-pay | Admitting: Family Medicine

## 2016-10-06 DIAGNOSIS — R251 Tremor, unspecified: Secondary | ICD-10-CM

## 2016-10-06 NOTE — Telephone Encounter (Signed)
Requesting refill on propranolol, asking that you please send to [facility]. Only have 3-4 pills left. Asking for 90 day supply

## 2016-10-07 MED ORDER — PROPRANOLOL HCL 10 MG PO TABS: 10.0000 mg | ORAL_TABLET | Freq: Every day | ORAL | 1 refills | Status: DC

## 2016-10-07 NOTE — Telephone Encounter (Signed)
Prescription sent to pharmacy.

## 2016-10-09 NOTE — Telephone Encounter (Signed)
Left voice message prescription has been sent to pharmacy

## 2016-10-27 ENCOUNTER — Other Ambulatory Visit: Payer: Self-pay | Admitting: Family Medicine

## 2016-10-29 ENCOUNTER — Other Ambulatory Visit: Payer: Self-pay | Admitting: Family Medicine

## 2016-10-29 DIAGNOSIS — E785 Hyperlipidemia, unspecified: Secondary | ICD-10-CM

## 2016-11-03 ENCOUNTER — Telehealth: Payer: Self-pay | Admitting: Family Medicine

## 2016-11-03 NOTE — Telephone Encounter (Signed)
Pt called back and is declining to get Wellness done with [facility]. Pt wants Dr Manuella Ghazi to do his Wellness.

## 2016-11-03 NOTE — Telephone Encounter (Signed)
Called Pt to schedule AWV with [facility] - knb °

## 2016-11-23 NOTE — Telephone Encounter (Signed)
Pt receive a missed call from you and would like for you to return call to discuss this further. It is okay to speak to wife Levander Campion [phone] if your not able to reach him on his cell [phone]. Try home number first.

## 2016-11-25 ENCOUNTER — Ambulatory Visit: Payer: Medicare Other | Admitting: Family Medicine

## 2016-12-07 ENCOUNTER — Ambulatory Visit (INDEPENDENT_AMBULATORY_CARE_PROVIDER_SITE_OTHER): Payer: Medicare Other | Admitting: Family Medicine

## 2016-12-07 ENCOUNTER — Encounter: Payer: Self-pay | Admitting: Family Medicine

## 2016-12-07 VITALS — BP 127/67 | HR 76 | Temp 98.4°F | Resp 16 | Ht 72.0 in | Wt 243.9 lb

## 2016-12-07 DIAGNOSIS — E7800 Pure hypercholesterolemia, unspecified: Secondary | ICD-10-CM

## 2016-12-07 DIAGNOSIS — M1A4710 Other secondary chronic gout, right ankle and foot, without tophus (tophi): Secondary | ICD-10-CM

## 2016-12-07 DIAGNOSIS — R251 Tremor, unspecified: Secondary | ICD-10-CM

## 2016-12-07 DIAGNOSIS — I10 Essential (primary) hypertension: Secondary | ICD-10-CM

## 2016-12-07 MED ORDER — LOSARTAN POTASSIUM 100 MG PO TABS: 100.0000 mg | ORAL_TABLET | Freq: Every day | ORAL | 1 refills | Status: DC

## 2016-12-07 MED ORDER — ATORVASTATIN CALCIUM 20 MG PO TABS: ORAL_TABLET | ORAL | 1 refills | Status: DC

## 2016-12-07 MED ORDER — ALLOPURINOL 300 MG PO TABS: ORAL_TABLET | ORAL | 0 refills | Status: DC

## 2016-12-07 MED ORDER — AMLODIPINE BESYLATE 2.5 MG PO TABS: 2.5000 mg | ORAL_TABLET | Freq: Every day | ORAL | 1 refills | Status: DC

## 2016-12-07 MED ORDER — PROPRANOLOL HCL 10 MG PO TABS: 10.0000 mg | ORAL_TABLET | Freq: Every day | ORAL | 1 refills | Status: DC

## 2016-12-07 NOTE — Progress Notes (Signed)
Name: Jesus Dillon   MRN: [id number]    DOB: 03/20/1941   Date:12/07/2016       Progress Note  Subjective  Chief Complaint  Chief Complaint  Patient presents with  . Follow-up    3 mo  . Medication Refill    Hypertension  This is a chronic problem. The problem is unchanged. The problem is controlled. Pertinent negatives include no blurred vision, chest pain, headaches, palpitations or shortness of breath. Past treatments include calcium channel blockers and angiotensin blockers. There is no history of kidney disease, CAD/MI or CVA.  Hyperlipidemia  This is a chronic problem. The problem is uncontrolled. Recent lipid tests were reviewed and are high. Pertinent negatives include no chest pain, myalgias or shortness of breath. Current antihyperlipidemic treatment includes statins. The current treatment provides moderate improvement of lipids. There are no compliance problems.    Gout: Patient has chronic gout, first affected the right great toe many years ago, since then he was started on Allopurinol and has had no further attacks, he is wondering if he can come off of Allopurinol.  Last uric acid levels were obtained in August 2017, was normal at 4.1 mg/dL.  Patient has benign tremor of the right hand, mainly with drawing or detailed work, no tremor with routine ADLs, he takes Propranolol 10 mg daily which helps with his symptoms.    Past Medical History:  Diagnosis Date  . Gout   . HBP (high blood pressure)   . Hyperlipidemia   . Tremors of nervous system     History reviewed. No pertinent surgical history.  History reviewed. No pertinent family history.  Social History   Social History  . Marital status: Married    Spouse name: N/A  . Number of children: N/A  . Years of education: N/A   Occupational History  . Not on file.   Social History Main Topics  . Smoking status: Never Smoker  . Smokeless tobacco: Never Used  . Alcohol use No  . Drug use: No  . Sexual activity:  Yes    Partners: Female   Other Topics Concern  . Not on file   Social History Narrative  . No narrative on file     Current Outpatient Prescriptions:  .  allopurinol (ZYLOPRIM) 300 MG tablet, TAKE 1 TABLET(300 MG) BY MOUTH DAILY, Disp: 90 tablet, Rfl: 0 .  amLODipine (NORVASC) 2.5 MG tablet, TAKE 1 TABLET DAILY, Disp: 90 tablet, Rfl: 1 .  atorvastatin (LIPITOR) 20 MG tablet, TAKE 1 TABLET DAILY AT 6 P.M., Disp: 90 tablet, Rfl: 1 .  diclofenac sodium (VOLTAREN) 1 % GEL, Apply 4 g topically 4 (four) times daily., Disp: 1 Tube, Rfl: 11 .  gabapentin (NEURONTIN) 300 MG capsule, Take 1 capsule (300 mg total) by mouth 3 (three) times daily., Disp: 270 capsule, Rfl: 0 .  losartan (COZAAR) 100 MG tablet, Take 1 tablet (100 mg total) by mouth daily., Disp: 90 tablet, Rfl: 1 .  propranolol (INDERAL) 10 MG tablet, Take 1 tablet (10 mg total) by mouth daily., Disp: 90 tablet, Rfl: 1  Allergies  Allergen Reactions  . Penicillins Rash     Review of Systems  Eyes: Negative for blurred vision.  Respiratory: Negative for shortness of breath.   Cardiovascular: Negative for chest pain and palpitations.  Musculoskeletal: Negative for myalgias.  Neurological: Negative for headaches.    Objective  Vitals:   12/07/16 0932  BP: 127/67  Pulse: 76  Resp: 16  Temp: 98.4 F (36.9  C)  TempSrc: Oral  SpO2: 98%  Weight: 243 lb 14.4 oz (110.6 kg)  Height: 6' (1.829 m)    Physical Exam  Constitutional: He is oriented to person, place, and time and well-developed, well-nourished, and in no distress.  HENT:  Head: Normocephalic and atraumatic.  Cardiovascular: Normal rate, regular rhythm, S1 normal, S2 normal and normal heart sounds.   No murmur heard. Pulmonary/Chest: Effort normal and breath sounds normal. He has no decreased breath sounds. He has no wheezes.  Abdominal: Soft. Bowel sounds are normal. There is no tenderness. There is no CVA tenderness.  Musculoskeletal:       Right ankle:  He exhibits no swelling.       Left ankle: He exhibits no swelling.  Neurological: He is alert and oriented to person, place, and time.  Psychiatric: Mood, memory, affect and judgment normal.  Nursing note and vitals reviewed.     Assessment & Plan  1. Essential hypertension Stable on present antihypertensive therapy - amLODipine (NORVASC) 2.5 MG tablet; Take 1 tablet (2.5 mg total) by mouth daily.  Dispense: 90 tablet; Refill: 1 - losartan (COZAAR) 100 MG tablet; Take 1 tablet (100 mg total) by mouth daily.  Dispense: 90 tablet; Refill: 1  2. Pure hypercholesterolemia  - atorvastatin (LIPITOR) 20 MG tablet; TAKE 1 TABLET DAILY AT 6 P.M.  Dispense: 90 tablet; Refill: 1 - Lipid panel - COMPLETE METABOLIC PANEL WITH GFR  3. 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 Tremors of nervous system  - propranolol (INDERAL) 10 MG tablet; Take 1 tablet (10 mg total) by mouth daily.  Dispense: 90 tablet; Refill: 1  Nechuma Boven Asad [facility] 12/07/2016 9:48 AM

## 2016-12-10 ENCOUNTER — Telehealth: Payer: Self-pay | Admitting: Family Medicine

## 2016-12-10 NOTE — Telephone Encounter (Signed)
Was seen the other day and states gabapentin was left off the list. Please send to [facility]. Asking that you send a emergency supply (maybe 3 days worth) to [place] that he only have 3 pills left and doubt that the ones he has at home would last until he receive the mail order

## 2016-12-11 ENCOUNTER — Other Ambulatory Visit: Payer: Self-pay | Admitting: Emergency Medicine

## 2016-12-11 DIAGNOSIS — G8929 Other chronic pain: Secondary | ICD-10-CM

## 2016-12-11 DIAGNOSIS — M5441 Lumbago with sciatica, right side: Principal | ICD-10-CM

## 2016-12-11 MED ORDER — GABAPENTIN 300 MG PO CAPS: 300.0000 mg | ORAL_CAPSULE | Freq: Three times a day (TID) | ORAL | 0 refills | Status: DC

## 2016-12-11 NOTE — Telephone Encounter (Signed)
#  15 sent to [facility] and a #90 day supply sent to [facility]

## 2017-01-20 ENCOUNTER — Encounter: Payer: Medicare Other | Admitting: Family Medicine

## 2017-02-16 ENCOUNTER — Other Ambulatory Visit: Payer: Self-pay | Admitting: Family Medicine

## 2017-02-16 ENCOUNTER — Ambulatory Visit (INDEPENDENT_AMBULATORY_CARE_PROVIDER_SITE_OTHER): Payer: Medicare Other | Admitting: Family Medicine

## 2017-02-16 ENCOUNTER — Encounter: Payer: Self-pay | Admitting: Family Medicine

## 2017-02-16 VITALS — BP 128/69 | HR 69 | Temp 98.3°F | Resp 16 | Ht 72.0 in | Wt 243.4 lb

## 2017-02-16 DIAGNOSIS — Z125 Encounter for screening for malignant neoplasm of prostate: Secondary | ICD-10-CM | POA: Diagnosis not present

## 2017-02-16 DIAGNOSIS — Z0000 Encounter for general adult medical examination without abnormal findings: Secondary | ICD-10-CM | POA: Diagnosis not present

## 2017-02-16 DIAGNOSIS — Z1211 Encounter for screening for malignant neoplasm of colon: Secondary | ICD-10-CM

## 2017-02-16 NOTE — Progress Notes (Signed)
Name: Jesus Dillon   MRN: [id number]    DOB: 1941/09/16   Date:02/16/2017       Progress Note  Subjective  Chief Complaint  Chief Complaint  Patient presents with  . Annual Exam    CPE    HPI  Pt. Presents for Complete Physical Exam.  He is due for colon cancer screening, last screening was 5 years ago, was not told when to come back.  He is due for prostate cancer screening.    Past Medical History:  Diagnosis Date  . Gout   . HBP (high blood pressure)   . Hyperlipidemia   . Tremors of nervous system     History reviewed. No pertinent surgical history.  History reviewed. No pertinent family history.  Social History   Social History  . Marital status: Married    Spouse name: N/A  . Number of children: N/A  . Years of education: N/A   Occupational History  . Not on file.   Social History Main Topics  . Smoking status: Never Smoker  . Smokeless tobacco: Never Used  . Alcohol use No  . Drug use: No  . Sexual activity: Yes    Partners: Female   Other Topics Concern  . Not on file   Social History Narrative  . No narrative on file     Current Outpatient Prescriptions:  .  allopurinol (ZYLOPRIM) 300 MG tablet, TAKE 1 TABLET(300 MG) BY MOUTH DAILY, Disp: 90 tablet, Rfl: 0 .  amLODipine (NORVASC) 2.5 MG tablet, Take 1 tablet (2.5 mg total) by mouth daily., Disp: 90 tablet, Rfl: 1 .  atorvastatin (LIPITOR) 20 MG tablet, TAKE 1 TABLET DAILY AT 6 P.M., Disp: 90 tablet, Rfl: 1 .  diclofenac sodium (VOLTAREN) 1 % GEL, Apply 4 g topically 4 (four) times daily., Disp: 1 Tube, Rfl: 11 .  gabapentin (NEURONTIN) 300 MG capsule, Take 1 capsule (300 mg total) by mouth 3 (three) times daily., Disp: 270 capsule, Rfl: 0 .  losartan (COZAAR) 100 MG tablet, Take 1 tablet (100 mg total) by mouth daily., Disp: 90 tablet, Rfl: 1 .  propranolol (INDERAL) 10 MG tablet, Take 1 tablet (10 mg total) by mouth daily., Disp: 90 tablet, Rfl: 1  Allergies  Allergen Reactions  .  Penicillins Rash     Review of Systems  Constitutional: Negative for chills, fever and malaise/fatigue.  HENT: Positive for sinus pain (sinus congestion). Negative for congestion and sore throat.   Eyes: Negative for blurred vision (scheduled for eye exam, distance vision is blurred) and double vision.  Respiratory: Negative for cough, sputum production and shortness of breath.   Cardiovascular: Negative for chest pain and leg swelling.  Gastrointestinal: Negative for abdominal pain, blood in stool, nausea and vomiting.  Genitourinary: Negative for hematuria.  Musculoskeletal: Positive for back pain (sciatica). Negative for neck pain.  Neurological: Negative for dizziness and headaches.  Psychiatric/Behavioral: Negative for depression. The patient is not nervous/anxious and does not have insomnia.       Objective  Vitals:   02/16/17 1104  BP: 128/69  Pulse: 69  Resp: 16  Temp: 98.3 F (36.8 C)  TempSrc: Oral  SpO2: 98%  Weight: 243 lb 6.4 oz (110.4 kg)  Height: 6' (1.829 m)    Physical Exam  Constitutional: He is oriented to person, place, and time and well-developed, well-nourished, and in no distress.  HENT:  Head: Normocephalic and atraumatic.  Right Ear: External ear normal.  Left Ear: External ear normal.  Eyes:  Pupils are equal, round, and reactive to light.  Cardiovascular: Normal rate and regular rhythm.   Pulmonary/Chest: Effort normal and breath sounds normal. He has no wheezes.  Genitourinary: Prostate normal. Prostate is not enlarged and not tender.  Musculoskeletal: He exhibits no edema.  Neurological: He is alert and oriented to person, place, and time.  Psychiatric: Mood, memory, affect and judgment normal.  Nursing note and vitals reviewed.     Assessment & Plan  1. Annual physical exam Obtain age-appropriate laboratory screening - TSH - VITAMIN D 25 Hydroxy (Vit-D Deficiency, Fractures) - CBC with Differential/Platelet  2. Screening for  prostate cancer  - PSA  3. Screening for colon cancer - Cologuard   Nareg Breighner Asad [facility] 02/16/2017 11:18 AM

## 2017-02-17 LAB — CBC WITH DIFFERENTIAL/PLATELET
BASOS: 1 %
Basophils Absolute: 0 10*3/uL (ref 0.0–0.2)
EOS (ABSOLUTE): 0.1 10*3/uL (ref 0.0–0.4)
EOS: 2 %
HEMATOCRIT: 42.2 % (ref 37.5–51.0)
Hemoglobin: 14.5 g/dL (ref 13.0–17.7)
Immature Grans (Abs): 0 10*3/uL (ref 0.0–0.1)
Immature Granulocytes: 0 %
LYMPHS ABS: 1.8 10*3/uL (ref 0.7–3.1)
Lymphs: 55 %
MCH: 29.1 pg (ref 26.6–33.0)
MCHC: 34.4 g/dL (ref 31.5–35.7)
MCV: 85 fL (ref 79–97)
MONOS ABS: 0.2 10*3/uL (ref 0.1–0.9)
Monocytes: 7 %
Neutrophils Absolute: 1.1 10*3/uL — ABNORMAL LOW (ref 1.4–7.0)
Neutrophils: 35 %
Platelets: 145 10*3/uL — ABNORMAL LOW (ref 150–379)
RBC: 4.99 x10E6/uL (ref 4.14–5.80)
RDW: 15.3 % (ref 12.3–15.4)
WBC: 3.3 10*3/uL — AB (ref 3.4–10.8)

## 2017-02-17 LAB — VITAMIN D 25 HYDROXY (VIT D DEFICIENCY, FRACTURES): Vit D, 25-Hydroxy: 9.5 ng/mL — ABNORMAL LOW (ref 30.0–100.0)

## 2017-02-17 LAB — TSH: TSH: 3.57 u[IU]/mL (ref 0.450–4.500)

## 2017-02-17 LAB — PSA: Prostate Specific Ag, Serum: 1.9 ng/mL (ref 0.0–4.0)

## 2017-02-19 ENCOUNTER — Other Ambulatory Visit: Payer: Self-pay | Admitting: Family Medicine

## 2017-02-19 ENCOUNTER — Telehealth: Payer: Self-pay

## 2017-02-19 MED ORDER — VITAMIN D (ERGOCALCIFEROL) 1.25 MG (50000 UNIT) PO CAPS: 50000.0000 [IU] | ORAL_CAPSULE | ORAL | 0 refills | Status: DC

## 2017-02-19 NOTE — Telephone Encounter (Signed)
Patient has been notified of lab results and a prescription for vitamin D3 50,000 units take 1 capsule once a week x12 weeks has been sent to [facility] per Dr. Manuella Ghazi, patient has been notified and verbalized understanding

## 2017-02-23 ENCOUNTER — Other Ambulatory Visit: Payer: Self-pay | Admitting: Family Medicine

## 2017-02-23 DIAGNOSIS — G8929 Other chronic pain: Secondary | ICD-10-CM

## 2017-02-23 DIAGNOSIS — M5441 Lumbago with sciatica, right side: Principal | ICD-10-CM

## 2017-04-04 ENCOUNTER — Other Ambulatory Visit: Payer: Self-pay | Admitting: Family Medicine

## 2017-04-04 DIAGNOSIS — M1A4710 Other secondary chronic gout, right ankle and foot, without tophus (tophi): Secondary | ICD-10-CM

## 2017-05-19 ENCOUNTER — Ambulatory Visit: Payer: Medicare Other | Admitting: Family Medicine

## 2017-06-08 ENCOUNTER — Ambulatory Visit: Payer: Medicare Other | Admitting: Family Medicine

## 2017-06-11 ENCOUNTER — Other Ambulatory Visit: Payer: Self-pay | Admitting: Family Medicine

## 2017-06-11 DIAGNOSIS — R251 Tremor, unspecified: Secondary | ICD-10-CM

## 2017-06-15 ENCOUNTER — Other Ambulatory Visit: Payer: Self-pay | Admitting: Family Medicine

## 2017-06-15 DIAGNOSIS — I10 Essential (primary) hypertension: Secondary | ICD-10-CM

## 2017-06-15 MED ORDER — LOSARTAN POTASSIUM 100 MG PO TABS: 100.0000 mg | ORAL_TABLET | Freq: Every day | ORAL | 1 refills | Status: DC

## 2017-06-15 NOTE — Telephone Encounter (Signed)
Medication has been refilled and sent to [facility] 

## 2017-06-18 ENCOUNTER — Ambulatory Visit (INDEPENDENT_AMBULATORY_CARE_PROVIDER_SITE_OTHER): Payer: Medicare Other | Admitting: Family Medicine

## 2017-06-18 ENCOUNTER — Encounter: Payer: Self-pay | Admitting: Family Medicine

## 2017-06-18 VITALS — BP 182/80 | HR 69 | Temp 98.4°F | Resp 16 | Ht 72.0 in | Wt 241.6 lb

## 2017-06-18 DIAGNOSIS — Z23 Encounter for immunization: Secondary | ICD-10-CM

## 2017-06-18 DIAGNOSIS — E7800 Pure hypercholesterolemia, unspecified: Secondary | ICD-10-CM

## 2017-06-18 DIAGNOSIS — R251 Tremor, unspecified: Secondary | ICD-10-CM

## 2017-06-18 DIAGNOSIS — I10 Essential (primary) hypertension: Secondary | ICD-10-CM

## 2017-06-18 DIAGNOSIS — M1A4710 Other secondary chronic gout, right ankle and foot, without tophus (tophi): Secondary | ICD-10-CM

## 2017-06-18 MED ORDER — LOSARTAN POTASSIUM 100 MG PO TABS: 100.0000 mg | ORAL_TABLET | Freq: Every day | ORAL | 1 refills | Status: DC

## 2017-06-18 MED ORDER — ATORVASTATIN CALCIUM 20 MG PO TABS: ORAL_TABLET | ORAL | 1 refills | Status: DC

## 2017-06-18 MED ORDER — AMLODIPINE BESYLATE 5 MG PO TABS: 5.0000 mg | ORAL_TABLET | Freq: Every day | ORAL | 0 refills | Status: DC

## 2017-06-18 MED ORDER — PROPRANOLOL HCL 10 MG PO TABS: 10.0000 mg | ORAL_TABLET | Freq: Every day | ORAL | 1 refills | Status: DC

## 2017-06-18 MED ORDER — ALLOPURINOL 300 MG PO TABS: 300.0000 mg | ORAL_TABLET | Freq: Every day | ORAL | 0 refills | Status: DC

## 2017-06-18 NOTE — Progress Notes (Signed)
Name: Jesus Dillon   MRN: [id number]    DOB: [birth year]   Date:06/18/2017       Progress Note  Subjective  Chief Complaint  Chief Complaint  Patient presents with  . Medication Refill    3 month F/U  . Hypertension    Denies any symptoms  . Hyperlipidemia    Hypertension  This is a chronic problem. The problem is unchanged. The problem is controlled. Pertinent negatives include no blurred vision, chest pain, headaches, palpitations or shortness of breath. Past treatments include calcium channel blockers and angiotensin blockers. There is no history of kidney disease, CAD/MI or CVA.  Hyperlipidemia  This is a chronic problem. The problem is uncontrolled. Recent lipid tests were reviewed and are high. Pertinent negatives include no chest pain, myalgias or shortness of breath. Current antihyperlipidemic treatment includes statins.   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  Gout: Patient has chronic gout, first affected the right great toe many years ago, since then he was started on Allopurinol and has had no further attacks, last uric acid levels were obtained in August 2017, was normal at 4.1 mg/dL.    Past Medical History:  Diagnosis Date  . Gout   . HBP (high blood pressure)   . Hyperlipidemia   . Tremors of nervous system     No past surgical history on file.  No family history on file.  Social History   Social History  . Marital status: Married    Spouse name: N/A  . Number of children: N/A  . Years of education: N/A   Occupational History  . Not on file.   Social History Main Topics  . Smoking status: Never Smoker  . Smokeless tobacco: Never Used  . Alcohol use No  . Drug use: No  . Sexual activity: Yes    Partners: Female   Other Topics Concern  . Not on file   Social History Narrative  . No  narrative on file     Current Outpatient Prescriptions:  .  allopurinol (ZYLOPRIM) 300 MG tablet, TAKE 1 TABLET DAILY, Disp: 90 tablet, Rfl: 0 .  amLODipine (NORVASC) 2.5 MG tablet, Take 1 tablet (2.5 mg total) by mouth daily., Disp: 90 tablet, Rfl: 1 .  atorvastatin (LIPITOR) 20 MG tablet, TAKE 1 TABLET DAILY AT 6 P.M., Disp: 90 tablet, Rfl: 1 .  diclofenac sodium (VOLTAREN) 1 % GEL, Apply 4 g topically 4 (four) times daily., Disp: 1 Tube, Rfl: 11 .  gabapentin (NEURONTIN) 300 MG capsule, TAKE 1 CAPSULE THREE TIMES A DAY, Disp: 270 capsule, Rfl: 0 .  losartan (COZAAR) 100 MG tablet, Take 1 tablet (100 mg total) by mouth daily., Disp: 90 tablet, Rfl: 1 .  propranolol (INDERAL) 10 MG tablet, Take 1 tablet (10 mg total) by mouth daily., Disp: 90 tablet, Rfl: 1 .  Vitamin D, Ergocalciferol, (DRISDOL) 50000 units CAPS capsule, TAKE 1 CAPSULE BY MOUTH 1 TIME A WEEK FOR 12 WEEKS, Disp: 13 capsule, Rfl: 0  Allergies  Allergen Reactions  . Penicillins Rash     Review of Systems  Eyes: Negative for blurred vision.  Respiratory: Negative for shortness of breath.   Cardiovascular: Negative for chest pain and palpitations.  Musculoskeletal: Negative for myalgias.  Neurological: Negative for headaches.    Objective  Vitals:   06/18/17 1003  BP: Marland Kitchen)  162/94  Pulse: 69  Resp: 16  Temp: 98.4 F (36.9 C)  TempSrc: Oral  SpO2: 97%  Weight: 241 lb 9.6 oz (109.6 kg)  Height: 6' (1.829 m)    Physical Exam  Constitutional: He is oriented to person, place, and time and well-developed, well-nourished, and in no distress.  HENT:  Head: Normocephalic and atraumatic.  Cardiovascular: Normal rate, regular rhythm, S1 normal, S2 normal and normal heart sounds.   No murmur heard. Pulmonary/Chest: Effort normal and breath sounds normal. He has no decreased breath sounds. He has no wheezes.  Abdominal: Soft. Bowel sounds are normal. There is no tenderness. There is no CVA tenderness.   Musculoskeletal:       Right ankle: He exhibits no swelling.       Left ankle: He exhibits no swelling.  Neurological: He is alert and oriented to person, place, and time.  Psychiatric: Mood, memory, affect and judgment normal.  Nursing note and vitals reviewed.    Assessment & Plan  1. Needs flu shot  - Flu vaccine HIGH DOSE PF (Fluzone High dose)  2. Essential hypertension Elevated blood pressure on repeat check, we'll increase amlodipine to 5 mg, reassess in 3 months - amLODipine (NORVASC) 5 MG tablet; Take 1 tablet (5 mg total) by mouth daily.  Dispense: 90 tablet; Refill: 0 - losartan (COZAAR) 100 MG tablet; Take 1 tablet (100 mg total) by mouth daily.  Dispense: 90 tablet; Refill: 1  3. 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  Dispense: 90 tablet; Refill: 0 - Uric acid  4. Pure hypercholesterolemia  - atorvastatin (LIPITOR) 20 MG tablet; TAKE 1 TABLET DAILY AT 6 P.M.  Dispense: 90 tablet; Refill: 1  5. Tremor of right hand  - propranolol (INDERAL) 10 MG tablet; Take 1 tablet (10 mg total) by mouth daily.  Dispense: 90 tablet; Refill: 1   Jesus Dillon [facility] 06/18/2017 10:15 AM

## 2017-06-22 ENCOUNTER — Telehealth: Payer: Self-pay | Admitting: Family Medicine

## 2017-06-22 ENCOUNTER — Other Ambulatory Visit: Payer: Self-pay | Admitting: Family Medicine

## 2017-06-22 DIAGNOSIS — G8929 Other chronic pain: Secondary | ICD-10-CM

## 2017-06-22 DIAGNOSIS — M5441 Lumbago with sciatica, right side: Principal | ICD-10-CM

## 2017-06-22 NOTE — Telephone Encounter (Signed)
PT NEEDS REFILLS ON ALL HIS MEDICATIONS FOR A 90 DAY SUPPLY. PT SAID THAT HE WAS HERE ON AUG. 28 2018 AND NONE OF HIS MEDS WERE SENT IN.  HE WILL SOON BE RUNNING OUT. PLEASE SEND IN REFILL ON ALL MEDS TO [facility].

## 2017-06-23 NOTE — Telephone Encounter (Signed)
Patient was seen on September 28th and following medications were sent to his pharmacy; Amlodipine 5 mg Losartan 100 mg  allopurinol 300 mg  Atorvastatin 20 mg Propranolol10 mg  he should check with his pharmacy and if they needs to be sent, please do so.

## 2017-06-24 ENCOUNTER — Other Ambulatory Visit: Payer: Self-pay

## 2017-06-24 NOTE — Telephone Encounter (Signed)
Please send to your nurse .

## 2017-06-24 NOTE — Telephone Encounter (Signed)
Pt pt's wife he was missing rx for gabapentin. RX sent in.

## 2017-06-24 NOTE — Telephone Encounter (Signed)
Encounter created in error

## 2017-08-24 ENCOUNTER — Other Ambulatory Visit: Payer: Self-pay | Admitting: Family Medicine

## 2017-08-24 DIAGNOSIS — I10 Essential (primary) hypertension: Secondary | ICD-10-CM

## 2017-09-01 ENCOUNTER — Other Ambulatory Visit: Payer: Self-pay | Admitting: Family Medicine

## 2017-09-01 DIAGNOSIS — G8929 Other chronic pain: Secondary | ICD-10-CM

## 2017-09-01 DIAGNOSIS — M5441 Lumbago with sciatica, right side: Principal | ICD-10-CM

## 2017-09-09 ENCOUNTER — Other Ambulatory Visit: Payer: Self-pay | Admitting: Family Medicine

## 2017-09-09 DIAGNOSIS — M1A4710 Other secondary chronic gout, right ankle and foot, without tophus (tophi): Secondary | ICD-10-CM

## 2017-09-17 ENCOUNTER — Other Ambulatory Visit: Payer: Self-pay

## 2017-09-17 ENCOUNTER — Ambulatory Visit: Payer: Medicare Other

## 2017-09-17 ENCOUNTER — Ambulatory Visit: Payer: Medicare Other | Admitting: Family Medicine

## 2017-09-17 DIAGNOSIS — I10 Essential (primary) hypertension: Secondary | ICD-10-CM

## 2017-09-17 MED ORDER — AMLODIPINE BESYLATE 5 MG PO TABS: 5.0000 mg | ORAL_TABLET | Freq: Every day | ORAL | 0 refills | Status: DC

## 2017-09-20 ENCOUNTER — Ambulatory Visit: Payer: Medicare Other | Admitting: Family Medicine

## 2017-10-04 ENCOUNTER — Ambulatory Visit: Payer: Medicare Other | Admitting: Family Medicine

## 2017-10-11 ENCOUNTER — Ambulatory Visit: Payer: Medicare Other | Admitting: Family Medicine

## 2017-10-14 ENCOUNTER — Ambulatory Visit: Payer: Medicare Other | Admitting: Family Medicine

## 2017-10-21 ENCOUNTER — Other Ambulatory Visit: Payer: Self-pay | Admitting: Family Medicine

## 2017-10-21 ENCOUNTER — Ambulatory Visit: Payer: Medicare Other | Admitting: Family Medicine

## 2017-10-21 ENCOUNTER — Encounter: Payer: Self-pay | Admitting: Family Medicine

## 2017-10-21 VITALS — BP 146/62 | HR 72 | Temp 98.2°F | Resp 14 | Wt 244.3 lb

## 2017-10-21 DIAGNOSIS — Z1211 Encounter for screening for malignant neoplasm of colon: Secondary | ICD-10-CM | POA: Diagnosis not present

## 2017-10-21 DIAGNOSIS — R0789 Other chest pain: Secondary | ICD-10-CM

## 2017-10-21 DIAGNOSIS — I10 Essential (primary) hypertension: Secondary | ICD-10-CM

## 2017-10-21 DIAGNOSIS — E559 Vitamin D deficiency, unspecified: Secondary | ICD-10-CM

## 2017-10-21 DIAGNOSIS — R251 Tremor, unspecified: Secondary | ICD-10-CM | POA: Diagnosis not present

## 2017-10-21 DIAGNOSIS — M1A4710 Other secondary chronic gout, right ankle and foot, without tophus (tophi): Secondary | ICD-10-CM | POA: Diagnosis not present

## 2017-10-21 DIAGNOSIS — E7800 Pure hypercholesterolemia, unspecified: Secondary | ICD-10-CM | POA: Diagnosis not present

## 2017-10-21 MED ORDER — PROPRANOLOL HCL 10 MG PO TABS: 10.0000 mg | ORAL_TABLET | Freq: Every day | ORAL | 1 refills | Status: DC

## 2017-10-21 MED ORDER — ATORVASTATIN CALCIUM 20 MG PO TABS: ORAL_TABLET | ORAL | 1 refills | Status: DC

## 2017-10-21 MED ORDER — LOSARTAN POTASSIUM 100 MG PO TABS: 100.0000 mg | ORAL_TABLET | Freq: Every day | ORAL | 1 refills | Status: DC

## 2017-10-21 NOTE — Progress Notes (Signed)
Name: Jesus Dillon   MRN: [id number]    DOB: 1941/08/22   Date:10/21/2017       Progress Note  Subjective  Chief Complaint  Chief Complaint  Patient presents with  . Gout  . Hypertension    Pt checks his Bp but not daily  . Hyperlipidemia  . Medication Refill    Hypertension  This is a chronic problem. The problem is unchanged. The problem is controlled. Associated symptoms include chest pain (occasional chest tightness at times, no typical anginal symptoms.) and headaches. Pertinent negatives include no blurred vision, palpitations or shortness of breath. Past treatments include calcium channel blockers and angiotensin blockers. There is no history of kidney disease, CAD/MI or CVA.  Hyperlipidemia  This is a chronic problem. The problem is uncontrolled. Recent lipid tests were reviewed and are high. Exacerbating diseases include obesity. Associated symptoms include chest pain (occasional chest tightness at times, no typical anginal symptoms.). Pertinent negatives include no leg pain, myalgias or shortness of breath. Current antihyperlipidemic treatment includes statins. The current treatment provides moderate improvement of lipids. There are no compliance problems.      Past Medical History:  Diagnosis Date  . Gout   . HBP (high blood pressure)   . Hyperlipidemia   . Tremors of nervous system     No past surgical history on file.  Family History  Problem Relation Age of Onset  . Hypertension Mother   . Dementia Mother   . Diabetes Brother   . Hypertension Brother   . Blindness Maternal Grandmother     Social History   Socioeconomic History  . Marital status: Married    Spouse name: Not on file  . Number of children: Not on file  . Years of education: Not on file  . Highest education level: Not on file  Social Needs  . Financial resource strain: Not on file  . Food insecurity - worry: Not on file  . Food insecurity - inability: Not on file  . Transportation needs -  medical: Not on file  . Transportation needs - non-medical: Not on file  Occupational History  . Not on file  Tobacco Use  . Smoking status: Never Smoker  . Smokeless tobacco: Never Used  Substance and Sexual Activity  . Alcohol use: No    Alcohol/week: 0.0 oz  . Drug use: No  . Sexual activity: Yes    Partners: Female  Other Topics Concern  . Not on file  Social History Narrative  . Not on file     Current Outpatient Medications:  .  allopurinol (ZYLOPRIM) 300 MG tablet, TAKE 1 TABLET DAILY, Disp: 90 tablet, Rfl: 0 .  amLODipine (NORVASC) 5 MG tablet, Take 1 tablet (5 mg total) by mouth daily., Disp: 90 tablet, Rfl: 0 .  atorvastatin (LIPITOR) 20 MG tablet, TAKE 1 TABLET DAILY AT 6 P.M., Disp: 90 tablet, Rfl: 1 .  gabapentin (NEURONTIN) 300 MG capsule, TAKE 1 CAPSULE THREE TIMES A DAY, Disp: 270 capsule, Rfl: 0 .  losartan (COZAAR) 100 MG tablet, Take 1 tablet (100 mg total) by mouth daily., Disp: 90 tablet, Rfl: 1 .  propranolol (INDERAL) 10 MG tablet, Take 1 tablet (10 mg total) by mouth daily., Disp: 90 tablet, Rfl: 1 .  diclofenac sodium (VOLTAREN) 1 % GEL, Apply 4 g topically 4 (four) times daily. (Patient not taking: Reported on 10/21/2017), Disp: 1 Tube, Rfl: 11 .  Vitamin D, Ergocalciferol, (DRISDOL) 50000 units CAPS capsule, TAKE 1 CAPSULE BY MOUTH 1  TIME A WEEK FOR 12 WEEKS (Patient not taking: Reported on 10/21/2017), Disp: 13 capsule, Rfl: 0  Allergies  Allergen Reactions  . Penicillins Rash     Review of Systems  Eyes: Negative for blurred vision.  Respiratory: Negative for shortness of breath.   Cardiovascular: Positive for chest pain (occasional chest tightness at times, no typical anginal symptoms.). Negative for palpitations.  Musculoskeletal: Negative for myalgias.  Neurological: Positive for headaches.    Objective  Vitals:   10/21/17 1536  BP: (!) 146/62  Pulse: 72  Resp: 14  Temp: 98.2 F (36.8 C)  TempSrc: Oral  SpO2: 99%  Weight: 244 lb 4.8  oz (110.8 kg)    Physical Exam  Constitutional: He is oriented to person, place, and time and well-developed, well-nourished, and in no distress.  HENT:  Head: Normocephalic and atraumatic.  Cardiovascular: Normal rate, regular rhythm, S1 normal and S2 normal.  Murmur heard.  Systolic murmur is present with a grade of 2/6. Pulmonary/Chest: Effort normal and breath sounds normal. He has no decreased breath sounds. He has no wheezes.  Abdominal: Soft. Bowel sounds are normal. There is no tenderness. There is no CVA tenderness.  Musculoskeletal:       Right ankle: He exhibits no swelling.       Left ankle: He exhibits no swelling.  Neurological: He is alert and oriented to person, place, and time.  Psychiatric: Mood, memory, affect and judgment normal.  Nursing note and vitals reviewed.    Assessment & Plan  1. Essential hypertension BP stable on present antihypertensive treatment - losartan (COZAAR) 100 MG tablet; Take 1 tablet (100 mg total) by mouth daily.  Dispense: 90 tablet; Refill: 1  2. Tremor of right hand  - propranolol (INDERAL) 10 MG tablet; Take 1 tablet (10 mg total) by mouth daily.  Dispense: 90 tablet; Refill: 1  3. Pure hypercholesterolemia  - Lipid panel - COMPLETE METABOLIC PANEL WITH GFR - atorvastatin (LIPITOR) 20 MG tablet; TAKE 1 TABLET DAILY AT 6 P.M.  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 Feeling of chest tightness Will refer to cardiology for possible stress test - Ambulatory referral to Cardiology  6. Vitamin D deficiency  - VITAMIN D 25 Hydroxy (Vit-D Deficiency, Fractures)  7. Screening for colon cancer Pt. could not obtain Cologuard because of history of blood in stool from an anal fissure, will order referral to gastroenterology for screening colonoscopy - Ambulatory referral to Gastroenterology   Dossie Der [facility] 10/21/2017 3:55 PM

## 2017-10-22 LAB — COMPREHENSIVE METABOLIC PANEL
A/G RATIO: 1.5 (ref 1.2–2.2)
ALT: 26 IU/L (ref 0–44)
AST: 28 IU/L (ref 0–40)
Albumin: 4.7 g/dL (ref 3.5–4.8)
Alkaline Phosphatase: 61 IU/L (ref 39–117)
BUN/Creatinine Ratio: 11 (ref 10–24)
BUN: 11 mg/dL (ref 8–27)
Bilirubin Total: 0.7 mg/dL (ref 0.0–1.2)
CALCIUM: 9.3 mg/dL (ref 8.6–10.2)
CO2: 22 mmol/L (ref 20–29)
Chloride: 104 mmol/L (ref 96–106)
Creatinine, Ser: 1.02 mg/dL (ref 0.76–1.27)
GFR, EST AFRICAN AMERICAN: 83 mL/min/{1.73_m2} (ref >59)
GFR, EST NON AFRICAN AMERICAN: 72 mL/min/{1.73_m2} (ref >59)
GLOBULIN, TOTAL: 3.2 g/dL (ref 1.5–4.5)
Glucose: 96 mg/dL (ref 65–99)
POTASSIUM: 4.6 mmol/L (ref 3.5–5.2)
SODIUM: 141 mmol/L (ref 134–144)
TOTAL PROTEIN: 7.9 g/dL (ref 6.0–8.5)

## 2017-10-22 LAB — LIPID PANEL W/O CHOL/HDL RATIO
Cholesterol, Total: 154 mg/dL (ref 100–199)
HDL: 46 mg/dL (ref >39)
LDL Calculated: 97 mg/dL (ref 0–99)
Triglycerides: 53 mg/dL (ref 0–149)
VLDL Cholesterol Cal: 11 mg/dL (ref 5–40)

## 2017-10-22 LAB — URIC ACID: URIC ACID: 3.9 mg/dL (ref 3.7–8.6)

## 2017-10-22 LAB — SPECIMEN STATUS REPORT

## 2017-10-29 ENCOUNTER — Other Ambulatory Visit: Payer: Self-pay

## 2017-10-29 ENCOUNTER — Telehealth: Payer: Self-pay

## 2017-10-29 DIAGNOSIS — Z1211 Encounter for screening for malignant neoplasm of colon: Secondary | ICD-10-CM

## 2017-10-29 NOTE — Telephone Encounter (Signed)
Gastroenterology Pre-Procedure Review  Request Date: 11/22/17 Requesting Physician: Dr. Marius Ditch  PATIENT REVIEW QUESTIONS: The patient responded to the following health history questions as indicated:    1. Are you having any GI issues? no 2. Do you have a personal history of Polyps? no 3. Do you have a family history of Colon Cancer or Polyps? no 4. Diabetes Mellitus? no 5. Joint replacements in the past 12 months?no 6. Major health problems in the past 3 months?no 7. Any artificial heart valves, MVP, or defibrillator?no    MEDICATIONS & ALLERGIES:    Patient reports the following regarding taking any anticoagulation/antiplatelet therapy:   Plavix, Coumadin, Eliquis, Xarelto, Lovenox, Pradaxa, Brilinta, or Effient? no Aspirin? no  Patient confirms/reports the following medications:  Current Outpatient Medications  Medication Sig Dispense Refill  . allopurinol (ZYLOPRIM) 300 MG tablet TAKE 1 TABLET DAILY 90 tablet 0  . amLODipine (NORVASC) 5 MG tablet Take 1 tablet (5 mg total) by mouth daily. 90 tablet 0  . atorvastatin (LIPITOR) 20 MG tablet TAKE 1 TABLET DAILY AT 6 P.M. 90 tablet 1  . diclofenac sodium (VOLTAREN) 1 % GEL Apply 4 g topically 4 (four) times daily. (Patient not taking: Reported on 10/21/2017) 1 Tube 11  . gabapentin (NEURONTIN) 300 MG capsule TAKE 1 CAPSULE THREE TIMES A DAY 270 capsule 0  . losartan (COZAAR) 100 MG tablet Take 1 tablet (100 mg total) by mouth daily. 90 tablet 1  . propranolol (INDERAL) 10 MG tablet Take 1 tablet (10 mg total) by mouth daily. 90 tablet 1  . Vitamin D, Ergocalciferol, (DRISDOL) 50000 units CAPS capsule TAKE 1 CAPSULE BY MOUTH 1 TIME A WEEK FOR 12 WEEKS (Patient not taking: Reported on 10/21/2017) 13 capsule 0   No current facility-administered medications for this visit.     Patient confirms/reports the following allergies:  Allergies  Allergen Reactions  . Penicillins Rash    No orders of the defined types were placed in this  encounter.   AUTHORIZATION INFORMATION Primary Insurance: 1D#: Group #:  Secondary Insurance: 1D#: Group #:  SCHEDULE INFORMATION: Date: 11/22/17 Time: Location: [facility]

## 2017-11-18 ENCOUNTER — Telehealth: Payer: Self-pay | Admitting: Gastroenterology

## 2017-11-18 NOTE — Telephone Encounter (Signed)
Pt left vm to reschedule his procedure for 11-22-17 please call pt

## 2017-11-19 ENCOUNTER — Encounter: Payer: Self-pay | Admitting: Emergency Medicine

## 2017-11-19 NOTE — Telephone Encounter (Signed)
Patients wife confirmed pt has canceled his colonoscopy and will call back at a later date to schedule.

## 2017-11-22 ENCOUNTER — Ambulatory Visit: Admission: RE | Admit: 2017-11-22 | Payer: Medicare Other | Source: Ambulatory Visit | Admitting: Gastroenterology

## 2017-11-22 ENCOUNTER — Encounter: Admission: RE | Payer: Self-pay | Source: Ambulatory Visit

## 2017-11-22 SURGERY — COLONOSCOPY WITH PROPOFOL
Anesthesia: General

## 2017-11-29 ENCOUNTER — Other Ambulatory Visit: Payer: Self-pay

## 2017-11-29 DIAGNOSIS — I10 Essential (primary) hypertension: Secondary | ICD-10-CM

## 2017-11-29 MED ORDER — AMLODIPINE BESYLATE 5 MG PO TABS: 5.0000 mg | ORAL_TABLET | Freq: Every day | ORAL | 0 refills | Status: DC

## 2017-11-29 NOTE — Telephone Encounter (Signed)
Rx approved

## 2017-12-01 ENCOUNTER — Other Ambulatory Visit: Payer: Self-pay

## 2017-12-01 ENCOUNTER — Ambulatory Visit: Payer: Medicare Other | Admitting: Family Medicine

## 2017-12-01 DIAGNOSIS — M5441 Lumbago with sciatica, right side: Secondary | ICD-10-CM

## 2017-12-01 DIAGNOSIS — G8929 Other chronic pain: Secondary | ICD-10-CM

## 2017-12-01 DIAGNOSIS — M1A4710 Other secondary chronic gout, right ankle and foot, without tophus (tophi): Secondary | ICD-10-CM

## 2017-12-01 MED ORDER — GABAPENTIN 300 MG PO CAPS: 300.0000 mg | ORAL_CAPSULE | Freq: Three times a day (TID) | ORAL | 0 refills | Status: DC

## 2017-12-01 MED ORDER — ALLOPURINOL 300 MG PO TABS: 300.0000 mg | ORAL_TABLET | Freq: Every day | ORAL | 0 refills | Status: DC

## 2017-12-07 ENCOUNTER — Ambulatory Visit: Payer: Self-pay | Admitting: Cardiovascular Disease

## 2017-12-20 ENCOUNTER — Ambulatory Visit (INDEPENDENT_AMBULATORY_CARE_PROVIDER_SITE_OTHER): Payer: Medicare Other | Admitting: Family Medicine

## 2017-12-20 ENCOUNTER — Encounter: Payer: Self-pay | Admitting: Physician Assistant

## 2017-12-20 ENCOUNTER — Encounter: Payer: Self-pay | Admitting: Family Medicine

## 2017-12-20 VITALS — BP 140/64 | HR 74 | Temp 97.9°F | Resp 14 | Ht 71.5 in | Wt 246.7 lb

## 2017-12-20 DIAGNOSIS — M48061 Spinal stenosis, lumbar region without neurogenic claudication: Secondary | ICD-10-CM | POA: Insufficient documentation

## 2017-12-20 DIAGNOSIS — R9431 Abnormal electrocardiogram [ECG] [EKG]: Secondary | ICD-10-CM | POA: Diagnosis not present

## 2017-12-20 DIAGNOSIS — E7800 Pure hypercholesterolemia, unspecified: Secondary | ICD-10-CM | POA: Diagnosis not present

## 2017-12-20 DIAGNOSIS — M47816 Spondylosis without myelopathy or radiculopathy, lumbar region: Secondary | ICD-10-CM

## 2017-12-20 DIAGNOSIS — R079 Chest pain, unspecified: Secondary | ICD-10-CM | POA: Diagnosis not present

## 2017-12-20 DIAGNOSIS — M48062 Spinal stenosis, lumbar region with neurogenic claudication: Secondary | ICD-10-CM

## 2017-12-20 DIAGNOSIS — I10 Essential (primary) hypertension: Secondary | ICD-10-CM | POA: Diagnosis not present

## 2017-12-20 DIAGNOSIS — R251 Tremor, unspecified: Secondary | ICD-10-CM | POA: Diagnosis not present

## 2017-12-20 MED ORDER — PROPRANOLOL HCL 10 MG PO TABS: 10.0000 mg | ORAL_TABLET | Freq: Three times a day (TID) | ORAL | 1 refills | Status: DC

## 2017-12-20 MED ORDER — ATORVASTATIN CALCIUM 20 MG PO TABS: 20.0000 mg | ORAL_TABLET | Freq: Every day | ORAL | 1 refills | Status: DC

## 2017-12-20 MED ORDER — TELMISARTAN 80 MG PO TABS: 80.0000 mg | ORAL_TABLET | Freq: Every day | ORAL | 0 refills | Status: DC

## 2017-12-20 MED ORDER — ALLOPURINOL 100 MG PO TABS: 200.0000 mg | ORAL_TABLET | Freq: Every day | ORAL | 1 refills | Status: DC

## 2017-12-20 MED ORDER — ASPIRIN EC 81 MG PO TBEC: 81.0000 mg | DELAYED_RELEASE_TABLET | Freq: Every day | ORAL | Status: DC

## 2017-12-20 NOTE — Assessment & Plan Note (Signed)
Reviewed copy from 2010

## 2017-12-20 NOTE — Progress Notes (Signed)
Discussed EKG with PCP. No prior for comparison in Epic or [facility] Every[facility]. Patient previously scheduled for stress test by different PCP, never completed. Patient has had intermittent chest pain, possibly after short acting propranolol wanes as he was taking short-acting once daily. Has history of obesity, HTN, and HLD. EKG at PCP office today showed sinus bradycardia, 58 bpm, 1st degree AV block, anterolateral TWI. Patient is completely asymptomatic at PCP office. He has been titrated to tid dosing of his propranolol and given strict ER precautions. He will be seen in our office this week for further evaluation and has been advised to go to the ED if needed.

## 2017-12-20 NOTE — Progress Notes (Signed)
BP 140/64   Pulse 74   Temp 97.9 F (36.6 C) (Oral)   Resp 14   Ht 5' 11.5" (1.816 m)   Wt 246 lb 11.2 oz (111.9 kg)   SpO2 99%   BMI 33.93 kg/m    Subjective:    [name], male    DOB: Feb 10, 1941, 77 y.o.   MRN: [id number]  HPI: Jesus Dillon is a 77 y.o. male  Chief Complaint  Patient presents with  . Follow-up  . Referral    cologuard questions    HPI Patient is new to me; previous provider left our practice  He would like to start with his medicine Losartan has been recalled Inherited likely, as mother had HTN Wife cooks, does like the taste of salt on certain foods; will try the DASH guidelines Feels chest tightness on occasion and that's why doctor was going to do the stress test; always had that issue, when talking and has to calm himself down; he did not actually have the stress test; closed everything down and decided to come and start fresh; he talked to the lady at the heart doctor, treadmill stress test; used to run a lot, cannot run because of arthritis, knee meniscus He has not had en EKG; can ride on his elliptical for an hour and be fine; it might just hit him when walking thrugh the store He was trying to open a garage door a few weeks ago, has a tightness on the left distal biceps; can bring that on  Back issues; reviewed 2010 lumbar imaging; to be copied and scanned; lumbar spine MRI 02/08/2009, diagnoses include 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 Jerilynn Mages put him on gabapentin; mornings would just stop him and he'd just to wait and stand; once he took the gabapentin, it helped  High cholesterol; on statin; no muscle aches; tries to limit fatty meats; trying to do better; has elliptical at home; has always taken exercise  for granted; puts weight on and has to work harder to keep it down Lab Results  Component Value Date   CHOL 154 10/21/2017   HDL 46 10/21/2017   LDLCALC 97 10/21/2017   TRIG 53 10/21/2017   CHOLHDL 3.3 05/05/2016   Vitamin D deficiency; took the 50k vit D capsules; then took 1000 iu OTC   He could hear his pulse in his ears; doesn't bother him, going on for a month  Obesity; gained 10-15 pounds over last couple of years; not able to exercise, used to run a lot and can't do the things he used to do; does eat some processed foods  Gout; he is on allopurinol for this; staying on it for a while; no flares; last flare was 2005 after airplane flight  Benign essential tremor; on 10 mg propranolol daily, on that for years; takes it in the morning  Depression screen [facility] 2/9 12/20/2017 10/21/2017 06/18/2017 02/16/2017 12/07/2016  Decreased Interest 0 0 0 0 0  Down, Depressed, Hopeless 0 0 0 0 0  PHQ - 2 Score 0 0 0 0 0    Relevant past medical, surgical, family and social history reviewed Past Medical History:  Diagnosis Date  . Gout   . HBP (high blood pressure)   . Heart murmur   . Hyperlipidemia   .  Tremors of nervous system    History reviewed. No pertinent surgical history. Family History  Problem Relation Age of Onset  . Hypertension Mother   . Dementia Mother   . Angina Mother   . Diabetes Brother   . Hypertension Brother   . Blindness Maternal Grandmother   nothing known about father  Social History   Tobacco Use  . Smoking status: Never Smoker  . Smokeless tobacco: Never Used  Substance Use Topics  . Alcohol use: No    Alcohol/week: 0.0 oz  . Drug use: No    Interim medical history since last visit reviewed. Allergies and medications reviewed  Review of Systems Per HPI unless specifically indicated above     Objective:    BP 140/64   Pulse 74   Temp 97.9 F (36.6 C) (Oral)   Resp 14   Ht 5' 11.5" (1.816 m)   Wt 246 lb 11.2 oz (111.9 kg)   SpO2 99%    BMI 33.93 kg/m   Wt Readings from Last 3 Encounters:  12/22/17 245 lb 8 oz (111.4 kg)  12/20/17 246 lb 11.2 oz (111.9 kg)  10/21/17 244 lb 4.8 oz (110.8 kg)    Physical Exam  Constitutional: He appears well-developed and well-nourished. No distress.  HENT:  Head: Normocephalic and atraumatic.  Eyes: EOM are normal. No scleral icterus.  Neck: No thyromegaly present.  Cardiovascular: Normal rate and regular rhythm.  Pulmonary/Chest: Effort normal and breath sounds normal.  Abdominal: Soft. Bowel sounds are normal. He exhibits no distension.  Musculoskeletal: He exhibits no edema.  Neurological: Coordination normal.  Skin: Skin is warm and dry. No pallor.  Psychiatric: He has a normal mood and affect. His behavior is normal. Judgment and thought content normal.    Results for orders placed or performed in visit on 10/21/17  Comprehensive metabolic panel  Result Value Ref Range   Glucose 96 65 - 99 mg/dL   BUN 11 8 - 27 mg/dL   Creatinine, Ser 1.02 0.76 - 1.27 mg/dL   GFR calc non Af Amer 72 >59 mL/min/1.73   GFR calc Af Amer 83 >59 mL/min/1.73   BUN/Creatinine Ratio 11 10 - 24   Sodium 141 134 - 144 mmol/L   Potassium 4.6 3.5 - 5.2 mmol/L   Chloride 104 96 - 106 mmol/L   CO2 22 20 - 29 mmol/L   Calcium 9.3 8.6 - 10.2 mg/dL   Total Protein 7.9 6.0 - 8.5 g/dL   Albumin 4.7 3.5 - 4.8 g/dL   Globulin, Total 3.2 1.5 - 4.5 g/dL   Albumin/Globulin Ratio 1.5 1.2 - 2.2   Bilirubin Total 0.7 0.0 - 1.2 mg/dL   Alkaline Phosphatase 61 39 - 117 IU/L   AST 28 0 - 40 IU/L   ALT 26 0 - 44 IU/L  Lipid Panel w/o Chol/HDL Ratio  Result Value Ref Range   Cholesterol, Total 154 100 - 199 mg/dL   Triglycerides 53 0 - 149 mg/dL   HDL 46 >39 mg/dL   VLDL Cholesterol Cal 11 5 - 40 mg/dL   LDL Calculated 97 0 - 99 mg/dL  Uric acid  Result Value Ref Range   Uric Acid 3.9 3.7 - 8.6 mg/dL  Specimen status report  Result Value Ref Range   specimen status report Comment       Assessment &  Plan:   Problem List Items Addressed This Visit      Cardiovascular and Mediastinum   HBP (high blood pressure)  Inherited; try DASH guidelines; switch ARB and return 7-10 days later      Relevant Medications   telmisartan (MICARDIS) 80 MG tablet   aspirin EC 81 MG tablet   propranolol (INDERAL) 10 MG tablet     Musculoskeletal and Integument   Facet arthritis of lumbar region    Reviewed copy from 2010      Relevant Medications   aspirin EC 81 MG tablet   allopurinol (ZYLOPRIM) 100 MG tablet     Other   Hyperlipidemia   Relevant Medications   telmisartan (MICARDIS) 80 MG tablet   aspirin EC 81 MG tablet   propranolol (INDERAL) 10 MG tablet   Lumbar canal stenosis    Imaging done in 2010      Abnormal EKG    Refer to cardiologist      Relevant Orders   Ambulatory referral to Cardiology    Other Visit Diagnoses    Chest pain, unspecified type    -  Primary   discussed risk of MI, reasons to call 911; increase beta-blocker frequency, in case he is having rebound as it wears off; high priority referral to cards   Relevant Orders   EKG 12-Lead (Completed)   Ambulatory referral to Cardiology   Tremor of right hand       likely benign essential tremor; adjust inderal   Relevant Medications   propranolol (INDERAL) 10 MG tablet       Follow up plan: Return in about 2 weeks (around 01/03/2018) for blood pressure recheck with CMA.  An after-visit summary was printed and given to the patient at [facility].  Please see the patient instructions which may contain other information and recommendations beyond what is mentioned above in the assessment and plan.  Meds ordered this encounter  Medications  . telmisartan (MICARDIS) 80 MG tablet    Sig: Take 1 tablet (80 mg total) by mouth daily. (this replaces losartan); for blood pressure    Dispense:  90 tablet    Refill:  0  . aspirin EC 81 MG tablet    Sig: Take 1 tablet (81 mg total) by mouth daily. No NSAIDs for at  least one hour  . allopurinol (ZYLOPRIM) 100 MG tablet    Sig: Take 2 tablets (200 mg total) by mouth daily.    Dispense:  180 tablet    Refill:  1    We're decreasing the dose  . DISCONTD: atorvastatin (LIPITOR) 20 MG tablet    Sig: Take 1 tablet (20 mg total) by mouth at bedtime. TAKE 1 TABLET DAILY AT 6 P.M.    Dispense:  90 tablet    Refill:  1  . propranolol (INDERAL) 10 MG tablet    Sig: Take 1 tablet (10 mg total) by mouth 3 (three) times daily.    Dispense:  270 tablet    Refill:  1    Orders Placed This Encounter  Procedures  . Ambulatory referral to Cardiology  . EKG 12-Lead

## 2017-12-20 NOTE — Assessment & Plan Note (Signed)
Inherited; try DASH guidelines; switch ARB and return 7-10 days later

## 2017-12-20 NOTE — Assessment & Plan Note (Signed)
Imaging done in 2010

## 2017-12-20 NOTE — Assessment & Plan Note (Signed)
Refer to cardiologist. °

## 2017-12-20 NOTE — Patient Instructions (Addendum)
When the new medicine Micardis arrives, use that in place of losartan Return 7-10 days later here at the office to see [facility] for blood pressure check and labs (non-fasting) Decrease the allopurinol from 300 mg daiy to 200 mg daily Take the inderal (propranolol) three times a day or about every 8 hours  Try to follow the DASH guidelines (DASH stands for Dietary Approaches to Stop Hypertension). Try to limit the sodium in your diet to no more than 1,500mg  of sodium per day. Certainly try to not exceed 2,000 mg per day at the very most. Do not add salt when cooking or at the table.  Check the sodium amount on labels when shopping, and choose items lower in sodium when given a choice. Avoid or limit foods that already contain a lot of sodium. Eat a diet rich in fruits and vegetables and whole grains, and try to lose weight if overweight or obese  Start the baby coated aspirin; take one today, then start every morning Take at least one hour prior to any aleve or ibuprofen or other NSAID   DASH Eating Plan DASH stands for "Dietary Approaches to Stop Hypertension." The DASH eating plan is a healthy eating plan that has been shown to reduce high blood pressure (hypertension). It may also reduce your risk for type 2 diabetes, heart disease, and stroke. The DASH eating plan may also help with weight loss. What are tips for following this plan? General guidelines  Avoid eating more than 2,300 mg (milligrams) of salt (sodium) a day. If you have hypertension, you may need to reduce your sodium intake to 1,500 mg a day.  Limit alcohol intake to no more than 1 drink a day for nonpregnant women and 2 drinks a day for men. One drink equals 12 oz of beer, 5 oz of wine, or 1 oz of hard liquor.  Work with your health care provider to maintain a healthy body weight or to lose weight. Ask what an ideal weight is for you.  Get at least 30 minutes of exercise that causes your heart to beat faster (aerobic exercise)  most days of the week. Activities may include walking, swimming, or biking.  Work with your health care provider or diet and nutrition specialist (dietitian) to adjust your eating plan to your individual calorie needs. Reading food labels  Check food labels for the amount of sodium per serving. Choose foods with less than 5 percent of the Daily Value of sodium. Generally, foods with less than 300 mg of sodium per serving fit into this eating plan.  To find whole grains, look for the word "whole" as the first word in the ingredient list. Shopping  Buy products labeled as "low-sodium" or "no salt added."  Buy fresh foods. Avoid canned foods and premade or frozen meals. Cooking  Avoid adding salt when cooking. Use salt-free seasonings or herbs instead of table salt or sea salt. Check with your health care provider or pharmacist before using salt substitutes.  Do not fry foods. Cook foods using healthy methods such as baking, boiling, grilling, and broiling instead.  Cook with heart-healthy oils, such as olive, canola, soybean, or sunflower oil. Meal planning   Eat a balanced diet that includes: ? 5 or more servings of fruits and vegetables each day. At each meal, try to fill half of your plate with fruits and vegetables. ? Up to 6-8 servings of whole grains each day. ? Less than 6 oz of lean meat, poultry, or fish  each day. A 3-oz serving of meat is about the same size as a deck of cards. One egg equals 1 oz. ? 2 servings of low-fat dairy each day. ? A serving of nuts, seeds, or beans 5 times each week. ? Heart-healthy fats. Healthy fats called Omega-3 fatty acids are found in foods such as flaxseeds and coldwater fish, like sardines, salmon, and mackerel.  Limit how much you eat of the following: ? Canned or prepackaged foods. ? Food that is high in trans fat, such as fried foods. ? Food that is high in saturated fat, such as fatty meat. ? Sweets, desserts, sugary drinks, and other  foods with added sugar. ? Full-fat dairy products.  Do not salt foods before eating.  Try to eat at least 2 vegetarian meals each week.  Eat more home-cooked food and less restaurant, buffet, and fast food.  When eating at a restaurant, ask that your food be prepared with less salt or no salt, if possible. What foods are recommended? The items listed may not be a complete list. Talk with your dietitian about what dietary choices are best for you. Grains Whole-grain or whole-wheat bread. Whole-grain or whole-wheat pasta. Brown rice. [place]. Bulgur. Whole-grain and low-sodium cereals. Pita bread. Low-fat, low-sodium crackers. Whole-wheat flour tortillas. Vegetables Fresh or frozen vegetables (raw, steamed, roasted, or grilled). Low-sodium or reduced-sodium tomato and vegetable juice. Low-sodium or reduced-sodium tomato sauce and tomato paste. Low-sodium or reduced-sodium canned vegetables. Fruits All fresh, dried, or frozen fruit. Canned fruit in natural juice (without added sugar). Meat and other protein foods Skinless chicken or [place]. Ground chicken or [place]. Pork with fat trimmed off. Fish and seafood. Egg whites. Dried beans, peas, or lentils. Unsalted nuts, nut butters, and seeds. Unsalted canned beans. Lean cuts of beef with fat trimmed off. Low-sodium, lean deli meat. Dairy Low-fat (1%) or fat-free (skim) milk. Fat-free, low-fat, or reduced-fat cheeses. Nonfat, low-sodium ricotta or cottage cheese. Low-fat or nonfat yogurt. Low-fat, low-sodium cheese. Fats and oils Soft margarine without trans fats. Vegetable oil. Low-fat, reduced-fat, or light mayonnaise and salad dressings (reduced-sodium). Canola, safflower, olive, soybean, and sunflower oils. Avocado. Seasoning and other foods Herbs. Spices. Seasoning mixes without salt. Unsalted popcorn and pretzels. Fat-free sweets. What foods are not recommended? The items listed may not be a complete list. Talk with your dietitian  about what dietary choices are best for you. Grains Baked goods made with fat, such as croissants, muffins, or some breads. Dry pasta or rice meal packs. Vegetables Creamed or fried vegetables. Vegetables in a cheese sauce. Regular canned vegetables (not low-sodium or reduced-sodium). Regular canned tomato sauce and paste (not low-sodium or reduced-sodium). Regular tomato and vegetable juice (not low-sodium or reduced-sodium). Angie Fava. Olives. Fruits Canned fruit in a light or heavy syrup. Fried fruit. Fruit in cream or butter sauce. Meat and other protein foods Fatty cuts of meat. Ribs. Fried meat. Berniece Salines. Sausage. Bologna and other processed lunch meats. Salami. Fatback. Hotdogs. Bratwurst. Salted nuts and seeds. Canned beans with added salt. Canned or smoked fish. Whole eggs or egg yolks. Chicken or [place] with skin. Dairy Whole or 2% milk, cream, and half-and-half. Whole or full-fat cream cheese. Whole-fat or sweetened yogurt. Full-fat cheese. Nondairy creamers. Whipped toppings. Processed cheese and cheese spreads. Fats and oils Butter. Stick margarine. Lard. Shortening. Ghee. Bacon fat. Tropical oils, such as coconut, palm kernel, or palm oil. Seasoning and other foods Salted popcorn and pretzels. Onion salt, garlic salt, seasoned salt, table salt, and sea salt. Worcestershire  sauce. Tartar sauce. Barbecue sauce. Teriyaki sauce. Soy sauce, including reduced-sodium. Steak sauce. Canned and packaged gravies. Fish sauce. Oyster sauce. Cocktail sauce. Horseradish that you find on the shelf. Ketchup. Mustard. Meat flavorings and tenderizers. Bouillon cubes. Hot sauce and Tabasco sauce. Premade or packaged marinades. Premade or packaged taco seasonings. Relishes. Regular salad dressings. Where to find more information:  National Heart, Lung, and [place]: https://wilson-eaton.com/  American Heart Association: www.heart.org Summary  The DASH eating plan is a healthy eating plan that has been shown  to reduce high blood pressure (hypertension). It may also reduce your risk for type 2 diabetes, heart disease, and stroke.  With the DASH eating plan, you should limit salt (sodium) intake to 2,300 mg a day. If you have hypertension, you may need to reduce your sodium intake to 1,500 mg a day.  When on the DASH eating plan, aim to eat more fresh fruits and vegetables, whole grains, lean proteins, low-fat dairy, and heart-healthy fats.  Work with your health care provider or diet and nutrition specialist (dietitian) to adjust your eating plan to your individual calorie needs. This information is not intended to replace advice given to you by your health care provider. Make sure you discuss any questions you have with your health care provider. Document Released: 08/27/2011 Document Revised: 08/31/2016 Document Reviewed: 08/31/2016 Elsevier Interactive Patient Education  2018 [employer].  Coronary Artery Disease, Male Coronary artery disease (CAD) is a condition in which the arteries that lead to the heart (coronary arteries) become narrow or blocked. The narrowing or blockage can lead to decreased blood flow to the heart. Prolonged reduced blood flow can cause a heart attack (myocardial infarction or MI). This condition may also be called coronary heart disease. Because CAD is the leading cause of death in men, it is important to understand what causes this condition and how it is treated. What are the causes? CAD is most often caused by atherosclerosis. This is the buildup of fat and cholesterol (plaque) on the inside of the arteries. Over time, the plaque may narrow or block the artery, reducing blood flow to the heart. Plaque can also become weak and break off within a coronary artery and cause a sudden blockage. Other less common causes of CAD include:  An embolism or blood clot in a coronary artery.  A tearing of the artery (spontaneous coronary artery dissection).  An  aneurysm.  Inflammation (vasculitis) in the artery wall.  What increases the risk? The following factors may make you more likely to develop this condition:  Age. Men over age 66 are at a greater risk of CAD.  Family history of CAD.  Gender. Men often develop CAD earlier in life than women.  High blood pressure (hypertension).  Diabetes.  High cholesterol levels.  Tobacco use.  Excessive alcohol use.  Lack of exercise.  A diet high in saturated and trans fats, such as fried food and processed meat.  Other possible risk factors include:  High stress levels.  Depression.  Obesity.  Sleep apnea.  What are the signs or symptoms? Many people do not have any symptoms during the early stages of CAD. As the condition progresses, symptoms may include:  Chest pain (angina). The pain can: ? Feel like a crushing or squeezing, or a tightness, pressure, fullness, or heaviness in the chest. ? Last more than a few minutes or can stop and recur. The pain tends to get worse with exercise or stress and to fade with rest.  Pain in the arms, neck, jaw, or back.  Unexplained heartburn or indigestion.  Shortness of breath.  Nausea or vomiting.  Sudden light-headedness.  Sudden cold sweats.  Fluttering or fast heartbeat (palpitations).  How is this diagnosed? This condition is diagnosed based on:  Your family and medical history.  A physical exam.  Tests, including: ? A test to check the electrical signals in your heart (electrocardiogram). ? Exercise stress test. This looks for signs of blockage when the heart is stressed with exercise, such as running on a treadmill. ? Pharmacologic stress test. This test looks for signs of blockage when the heart is being stressed with a medicine. ? Blood tests. ? Coronary angiogram. This is a procedure to look at the coronary arteries to see if there is any blockage. During this test, a dye is injected into your arteries so they  appear on an X-ray. ? A test that uses sound waves to take a picture of your heart (echocardiogram). ? Chest X-ray.  How is this treated? This condition may be treated by:  Healthy lifestyle changes to reduce risk factors.  Medicines such as: ? Antiplatelet medicines and blood-thinning medicines, such as aspirin. These help to prevent blood clots. ? Nitroglycerin. ? Blood pressure medicines. ? Cholesterol-lowering medicine.  Coronary angioplasty and stenting. During this procedure, a thin, flexible tube is inserted through a blood vessel and into a blocked artery. A balloon or similar device on the end of the tube is inflated to open up the artery. In some cases, a small, mesh tube (stent) is inserted into the artery to keep it open.  Coronary artery bypass surgery. During this surgery, veins or arteries from other parts of the body are used to create a bypass around the blockage and allow blood to reach your heart.  Follow these instructions at home: Medicines  Take over-the-counter and prescription medicines only as told by your health care provider.  Do not take the following medicines unless your health care provider approves: ? NSAIDs, such as ibuprofen, naproxen, or celecoxib. ? Vitamin supplements that contain vitamin A, vitamin E, or both. Lifestyle  Follow an exercise program approved by your health care provider. Aim for 150 minutes of moderate exercise or 75 minutes of vigorous exercise each week.  Maintain a healthy weight or lose weight as approved by your health care provider.  Rest when you are tired.  Learn to manage stress or try to limit your stress. Ask your health care provider for suggestions if you need help.  Get screened for depression and seek treatment, if needed.  Do not use any products that contain nicotine or tobacco, such as cigarettes and e-cigarettes. If you need help quitting, ask your health care provider.  Do not use illegal drugs. Eating  and drinking  Follow a heart-healthy diet. A dietitian can help educate you about healthy food options and changes. In general, eat plenty of fruits and vegetables, lean meats, and whole grains.  Avoid foods high in: ? Sugar. ? Salt (sodium). ? Saturated fat, such as processed or fatty meat. ? Trans fat, such as fried foods.  Use healthy cooking methods such as roasting, grilling, broiling, baking, poaching, steaming, or stir-frying.  If you drink alcohol, and your health care provider approves, limit your alcohol intake to no more than 2 drinks per day. One drink equals 12 ounces of beer, 5 ounces of wine, or 1 ounces of hard liquor. General instructions  Manage any other health conditions, such as hypertension  and diabetes. These conditions affect your heart.  Your health care provider may ask you to monitor your blood pressure. Ideally, your blood pressure should be below 130/80.  Keep all follow-up visits as told by your health care provider. This is important. Get help right away if:  You have pain in your chest, neck, arm, jaw, stomach, or back that: ? Lasts more than a few minutes. ? Is recurring. ? Is not relieved by taking medicine under your tongue (sublingualnitroglycerin).  You have too much (profuse) sweating without cause.  You have unexplained: ? Heartburn or indigestion. ? Shortness of breath or difficulty breathing. ? Fluttering or fast heartbeat (palpitations). ? Nausea or vomiting. ? Fatigue. ? Feelings of nervousness or anxiety. ? Weakness. ? Diarrhea.  You have sudden light-headedness or dizziness.  You faint.  You feel like hurting yourself or think about taking your own life. These symptoms may represent a serious problem that is an emergency. Do not wait to see if the symptoms will go away. Get medical help right away. Call your local emergency services (911 in the [place] Do not drive yourself to the hospital. Summary  Coronary artery disease  (CAD) is a process in which the arteries that lead to the heart (coronary arteries) become narrow or blocked. The narrowing or blockage can lead to a heart attack.  Many people do not have any symptoms during the early stages of CAD. This is called "silent CAD."  CAD can be treated with lifestyle changes, medicines, surgery, or a combination of these treatments. This information is not intended to replace advice given to you by your health care provider. Make sure you discuss any questions you have with your health care provider. Document Released: 04/04/2014 Document Revised: 08/28/2016 Document Reviewed: 08/28/2016 Elsevier Interactive Patient Education  [employer].

## 2017-12-22 ENCOUNTER — Encounter: Payer: Self-pay | Admitting: Cardiovascular Disease

## 2017-12-22 ENCOUNTER — Other Ambulatory Visit: Payer: Self-pay | Admitting: Family Medicine

## 2017-12-22 ENCOUNTER — Ambulatory Visit (INDEPENDENT_AMBULATORY_CARE_PROVIDER_SITE_OTHER): Payer: Medicare Other | Admitting: Cardiovascular Disease

## 2017-12-22 VITALS — BP 141/80 | HR 55 | Ht 72.0 in | Wt 245.5 lb

## 2017-12-22 DIAGNOSIS — E7800 Pure hypercholesterolemia, unspecified: Secondary | ICD-10-CM | POA: Diagnosis not present

## 2017-12-22 DIAGNOSIS — R9431 Abnormal electrocardiogram [ECG] [EKG]: Secondary | ICD-10-CM | POA: Diagnosis not present

## 2017-12-22 DIAGNOSIS — R011 Cardiac murmur, unspecified: Secondary | ICD-10-CM | POA: Diagnosis not present

## 2017-12-22 DIAGNOSIS — I10 Essential (primary) hypertension: Secondary | ICD-10-CM | POA: Diagnosis not present

## 2017-12-22 DIAGNOSIS — R079 Chest pain, unspecified: Secondary | ICD-10-CM

## 2017-12-22 DIAGNOSIS — R0602 Shortness of breath: Secondary | ICD-10-CM | POA: Diagnosis not present

## 2017-12-22 MED ORDER — ATORVASTATIN CALCIUM 20 MG PO TABS: 20.0000 mg | ORAL_TABLET | Freq: Every day | ORAL | 1 refills | Status: DC

## 2017-12-22 NOTE — Progress Notes (Signed)
New Rx sent.

## 2017-12-22 NOTE — Progress Notes (Signed)
Cardiology Office Note  Date:  12/22/2017   ID:  Kandee Keen, DOB [birth year], MRN [id number]  PCP:  Arnetha Courser, MD   Chief Complaint  Patient presents with  . other    ABN EKG. Meds reviewed verbally with pt.    HPI:  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 Sanda Klein for consultation of his Abn EKG T wave Abn anterolateral area and chest pain symptoms with shortness of breath  Reports having chest tightness on heavy exertion for months, possibly a year He has noticed this when shopping, rushing to the back of the store has to stop to wait for tightness in breathing to improve Risk factors include hyperlipidemia Does not check his blood pressure at home on a regular basis  Uses elictical, low-grade exercise, has some shortness of breath but not much chest pain Does not do exercise religiously, weight has been trending up, would like to start exercise program  Initial blood pressure on arrival to our office today in the 180s Repeat by myself 409 systolic  EKG personally reviewed by myself on todays visit Shows normal sinus rhythm with rate 55 bpm T wave abnormality V3 through V6 1 and aVL  Previous EKG from primary care last month reviewed also showing T wave abnormality anterolateral leads   PMH:   has a past medical history of Gout, HBP (high blood pressure), Heart murmur, Hyperlipidemia, and Tremors of nervous system.  PSH:   History reviewed. No pertinent surgical history.  Current Outpatient Medications  Medication Sig Dispense Refill  . allopurinol (ZYLOPRIM) 100 MG tablet Take 2 tablets (200 mg total) by mouth daily. 180 tablet 1  . amLODipine (NORVASC) 5 MG tablet Take 1 tablet (5 mg total) by mouth daily. 90 tablet 0  . aspirin EC 81 MG tablet Take 1 tablet (81 mg total) by mouth daily.  No NSAIDs for at least one hour    . atorvastatin (LIPITOR) 20 MG tablet Take 1 tablet (20 mg total) by mouth at bedtime. TAKE 1 TABLET DAILY AT 6 P.M. 90 tablet 1  . cholecalciferol (VITAMIN D) 1000 units tablet Take 1,000 Units by mouth daily.    . diclofenac sodium (VOLTAREN) 1 % GEL Apply 4 g topically 4 (four) times daily. (Patient taking differently: Apply 4 g topically daily as needed. ) 1 Tube 11  . gabapentin (NEURONTIN) 300 MG capsule Take 1 capsule (300 mg total) by mouth 3 (three) times daily. 270 capsule 0  . propranolol (INDERAL) 10 MG tablet Take 1 tablet (10 mg total) by mouth 3 (three) times daily. 270 tablet 1  . telmisartan (MICARDIS) 80 MG tablet Take 1 tablet (80 mg total) by mouth daily. (this replaces losartan); for blood pressure 90 tablet 0   No current facility-administered medications for this visit.      Allergies:   Penicillins   Social History:  The patient  reports that he has never smoked. He has never used smokeless tobacco. He reports that he does not drink alcohol or use drugs.   Family History:   family history includes Angina in his mother; Blindness in his maternal grandmother; Dementia in his mother; Diabetes in his brother; Hypertension in his brother and mother.    Review of Systems: Review of Systems  Constitutional: Negative.   Respiratory: Positive for shortness of breath.   Cardiovascular: Positive  for chest pain.  Gastrointestinal: Negative.   Musculoskeletal: Negative.   Neurological: Negative.   Psychiatric/Behavioral: Negative.   All other systems reviewed and are negative.    PHYSICAL EXAM: VS:  BP (!) 141/80 (BP Location: Right Arm, Patient Position: Sitting, Cuff Size: Large)   Pulse (!) 55   Ht 6' (1.829 m)   Wt 245 lb 8 oz (111.4 kg)   BMI 33.30 kg/m  , BMI Body mass index is 33.3 kg/m. GEN: Well nourished, well developed, in no acute distress  HEENT: normal  Neck: no JVD, carotid bruits, or masses Cardiac: RRR; no  murmurs, rubs, or gallops,no edema  Respiratory:  clear to auscultation bilaterally, normal work of breathing GI: soft, nontender, nondistended, + BS MS: no deformity or atrophy  Skin: warm and dry, no rash Neuro:  Strength and sensation are intact Psych: euthymic mood, full affect    Recent Labs: 02/16/2017: Hemoglobin 14.5; Platelets 145; TSH 3.570 10/21/2017: ALT 26; BUN 11; Creatinine, Ser 1.02; Potassium 4.6; Sodium 141    Lipid Panel Lab Results  Component Value Date   CHOL 154 10/21/2017   HDL 46 10/21/2017   LDLCALC 97 10/21/2017   TRIG 53 10/21/2017      Wt Readings from Last 3 Encounters:  12/22/17 245 lb 8 oz (111.4 kg)  12/20/17 246 lb 11.2 oz (111.9 kg)  10/21/17 244 lb 4.8 oz (110.8 kg)       ASSESSMENT AND PLAN:  Chest pain with moderate risk for cardiac etiology - Plan: EKG 12-Lead, NM Myocar Multi W/Spect W/Wall Motion / EF Typical and atypical features Unable to exclude ischemia, symptoms presenting with heavy exertion Markedly abnormal EKG with T wave abnormality concerning for anterolateral ischemia Discussed with him in detail, various types of testing discussed We will schedule him for treadmill Myoview  Abnormal EKG - Plan: EKG 12-Lead, NM Myocar Multi W/Spect W/Wall Motion / EF Findings on EKG discussed with him, we will try to obtain prior EKGs When he saw Dr. Lucita Lora Stress test as above  Cardiac murmur - Plan: EKG 12-Lead, NM Myocar Multi W/Spect W/Wall Motion / EF Sounds like aortic valve sclerosis without significant stenosis, will monitor for now  Pure hypercholesterolemia LDL slightly high but currently with no known diagnosis of coronary disease On a statin  Essential hypertension Markedly elevated on arrival but improved on recheck by myself If blood pressure does run high we could increase amlodipine He will monitor at home  Shortness of breath - Plan: EKG 12-Lead, NM Myocar Multi W/Spect W/Wall Motion / EF Shortness of  breath symptoms on heavy exertion Stress test as above, unable to exclude ischemia If stress test is normal would work on weight loss, regular exercise program  Disposition:   F/U as needed   Total encounter time more than 60 minutes  Greater than 50% was spent in counseling and coordination of care with the patient    Orders Placed This Encounter  Procedures  . NM Myocar Multi W/Spect W/Wall Motion / EF  . EKG 12-Lead     Signed, Esmond Plants, M.D., Ph.D. 12/22/2017  [place], [place]

## 2017-12-22 NOTE — Patient Instructions (Addendum)
Medication Instructions:   No medication changes made  Labwork:  No new labs needed  Testing/Procedures: Jesus Dillon  Your caregiver has ordered a Stress Test with nuclear imaging. The purpose of this test is to evaluate the blood supply to your heart muscle. This procedure is referred to as a "Non-Invasive Stress Test." This is because other than having an IV started in your vein, nothing is inserted or "invades" your body. Cardiac stress tests are done to find areas of poor blood flow to the heart by determining the extent of coronary artery disease (CAD).   Please note: these test may take anywhere between 2-4 hours to complete  PLEASE REPORT TO [place] AT THE FIRST DESK WILL DIRECT YOU WHERE TO GO  Date of Procedure:_____________________________________  Arrival Time for Procedure:______________________________  Instructions regarding medication:    _XX___:  Hold Amlodipine and Propranolol  night before procedure and morning of procedure   PLEASE NOTIFY THE OFFICE AT LEAST 24 HOURS IN ADVANCE IF YOU ARE UNABLE TO KEEP YOUR APPOINTMENT.  [phone] AND  PLEASE NOTIFY NUCLEAR MEDICINE AT [facility] AT LEAST 24 HOURS IN ADVANCE IF YOU ARE UNABLE TO KEEP YOUR APPOINTMENT. [phone]  How to prepare for your Myoview test:  1. Do not eat or drink after midnight 2. No caffeine for 24 hours prior to test 3. No smoking 24 hours prior to test. 4. Your medication may be taken with water.  If your doctor stopped a medication because of this test, do not take that medication. 5. Ladies, please do not wear dresses.  Skirts or pants are appropriate. Please wear a short sleeve shirt. 6. No perfume, cologne or lotion. 7. Wear comfortable walking shoes. No heels!   Please hold the amlodipine and propranolol the night before and morning of the test  We will schedule a treadmill myoview for abn EKG, chest pain  Follow-Up: It was a pleasure seeing you  in the office today. Please call us if you have new issues that need to be addressed before your next appt.  [phone]  Your physician wants you to follow-up in: 12 months as needed.  You will receive a reminder letter in the mail two months in advance. If you don't receive a letter, please call our office to schedule the follow-up appointment.  If you need a refill on your cardiac medications before your next appointment, please call your pharmacy.  For educational health videos Log in to : [email or web address] Or : [email or web address], password : [email or web address]

## 2017-12-23 ENCOUNTER — Encounter: Payer: Self-pay | Admitting: Family Medicine

## 2018-01-03 ENCOUNTER — Ambulatory Visit: Payer: Medicare Other

## 2018-01-03 VITALS — BP 136/80 | HR 63

## 2018-01-03 DIAGNOSIS — Z5181 Encounter for therapeutic drug level monitoring: Secondary | ICD-10-CM

## 2018-01-03 DIAGNOSIS — I10 Essential (primary) hypertension: Secondary | ICD-10-CM

## 2018-01-03 LAB — BASIC METABOLIC PANEL WITH GFR
BUN: 17 mg/dL (ref 7–25)
CHLORIDE: 105 mmol/L (ref 98–110)
CO2: 27 mmol/L (ref 20–32)
Calcium: 9.2 mg/dL (ref 8.6–10.3)
Creat: 1.11 mg/dL (ref 0.70–1.18)
GFR, EST AFRICAN AMERICAN: 75 mL/min/{1.73_m2} (ref >=60)
GFR, Est Non African American: 65 mL/min/{1.73_m2} (ref >=60)
Glucose, Bld: 90 mg/dL (ref 65–139)
POTASSIUM: 4.1 mmol/L (ref 3.5–5.3)
Sodium: 138 mmol/L (ref 135–146)

## 2018-01-04 ENCOUNTER — Other Ambulatory Visit: Payer: Self-pay | Admitting: Family Medicine

## 2018-01-04 MED ORDER — TELMISARTAN 80 MG PO TABS
80.0000 mg | ORAL_TABLET | Freq: Every day | ORAL | 3 refills | Status: DC
Start: 2018-01-04 — End: 2019-02-20

## 2018-01-04 NOTE — Progress Notes (Signed)
Refill ARB; normal BMP

## 2018-01-07 ENCOUNTER — Telehealth: Payer: Self-pay | Admitting: Family Medicine

## 2018-01-07 ENCOUNTER — Ambulatory Visit: Payer: Self-pay | Admitting: *Deleted

## 2018-01-07 NOTE — Telephone Encounter (Signed)
Copied from [place] [phone]. Topic: General - Other >> Jan 07, 2018  9:28 AM Yvette Rack wrote: Reason for CRM: pt calling about labs

## 2018-01-07 NOTE — Telephone Encounter (Signed)
Patient was notified in triage call.

## 2018-01-07 NOTE — Telephone Encounter (Signed)
Already left voicemail through lab call.  Duplicate messages

## 2018-01-07 NOTE — Telephone Encounter (Signed)
   Reason for Disposition . Caller has NON-URGENT medication question about med that PCP prescribed and triager unable to answer question  Answer Assessment - Initial Assessment Questions 1. SYMPTOMS: "Do you have any symptoms?"     See result note that was sent to the office regarding him being changed to [place].    His labs were fine but Dr. Sanda Klein called in [place] for him for the next year and he is not on Benicar.   Is this a change? 2. SEVERITY: If symptoms are present, ask "Are they mild, moderate or severe?"     I sent the result note to the [place] office for Dr. Sanda Klein to address.  Protocols used: MEDICATION QUESTION CALL-A-AH

## 2018-01-07 NOTE — Telephone Encounter (Signed)
No, I'm so sorry I meant to say "Micardis" He should stay on Micardis

## 2018-01-12 ENCOUNTER — Encounter
Admission: RE | Admit: 2018-01-12 | Discharge: 2018-01-12 | Disposition: A | Discharge status: Home / Self Care | Payer: Medicare Other | Source: Ambulatory Visit | Attending: Cardiovascular Disease | Admitting: Cardiovascular Disease

## 2018-01-12 DIAGNOSIS — R9431 Abnormal electrocardiogram [ECG] [EKG]: Secondary | ICD-10-CM | POA: Insufficient documentation

## 2018-01-12 DIAGNOSIS — R079 Chest pain, unspecified: Secondary | ICD-10-CM | POA: Diagnosis not present

## 2018-01-12 DIAGNOSIS — R0602 Shortness of breath: Secondary | ICD-10-CM | POA: Diagnosis not present

## 2018-01-12 DIAGNOSIS — R011 Cardiac murmur, unspecified: Secondary | ICD-10-CM | POA: Diagnosis not present

## 2018-01-12 LAB — NM MYOCAR MULTI W/SPECT W/WALL MOTION / EF
CHL CUP NUCLEAR SDS: 0
CHL CUP NUCLEAR SSS: 0
CHL CUP RESTING HR STRESS: 58 {beats}/min
CSEPEDS: 39 s
CSEPPHR: 129 {beats}/min
Estimated workload: 8.2 METS
Exercise duration (min): 7 min
LV dias vol: 76 mL (ref 62–150)
LVSYSVOL: 20 mL
NUC STRESS TID: 0.97
Percent HR: 88 %
SRS: 5

## 2018-01-12 MED ORDER — TECHNETIUM TC 99M TETROFOSMIN IV KIT
13.9600 | PACK | Freq: Once | INTRAVENOUS | Status: AC | PRN
  Administered 2018-01-12: 13.96 via INTRAVENOUS

## 2018-01-12 MED ORDER — TECHNETIUM TC 99M TETROFOSMIN IV KIT
31.1640 | PACK | Freq: Once | INTRAVENOUS | Status: AC | PRN
  Administered 2018-01-12: 31.164 via INTRAVENOUS

## 2018-01-13 ENCOUNTER — Telehealth: Payer: Self-pay | Admitting: Cardiovascular Disease

## 2018-01-13 ENCOUNTER — Ambulatory Visit: Payer: Self-pay

## 2018-01-13 NOTE — Telephone Encounter (Signed)
Patient returning call Will be leaving for work, try cell [phone]

## 2018-01-13 NOTE — Telephone Encounter (Signed)
Pt calling with severe scrotal swelling that began a month and a half ago. Pt states he first noted the swelling after trying to lift up a garage door. Pt states that since then the scrotal swelling has been increasing in size. Pt denies pain. Pt states that he has never been told he has had a hernia. Denies any other sx.   Appt made with Dr. Sanda Klein tomorrow.  Reason for Disposition . [1] Scrotum swelling AND [2] no pain  Answer Assessment - Initial Assessment Questions 1. SCROTAL SWELLING: "What does the scrotum look like?" "How swollen is it?" (mild, moderate severe; compare to other side)     severe 2. LOCATION: "Where is the swelling located?"     Left scrotum 3. ONSET: "When did the swelling start?"     Month and a half ago 4. PATTERN: "Does it come and go, or has it been constant since it started?"     Gradually increased in size and getting bigger 5. SCROTAL PAIN: "Is there any pain?" If so, ask: "How bad is it?"  (Scale 1-10; or mild, moderate, severe)     no 6. HERNIA: "Has a doctor ever told you that you have a hernia?"     no 7. OTHER SYMPTOMS: "Do you have any other symptoms?" (e.g., fever, abdominal pain, vomiting, difficulty passing urine)     no  Protocols used: SCROTUM SWELLING-A-AH

## 2018-01-13 NOTE — Telephone Encounter (Signed)
Patient called and verbalized understanding of stress test results and follow up.

## 2018-01-14 ENCOUNTER — Encounter: Payer: Self-pay | Admitting: Family Medicine

## 2018-01-14 ENCOUNTER — Ambulatory Visit: Payer: Medicare Other | Admitting: Family Medicine

## 2018-01-14 VITALS — BP 160/82 | HR 65 | Temp 98.5°F | Resp 14 | Ht 72.0 in | Wt 240.2 lb

## 2018-01-14 DIAGNOSIS — N5089 Other specified disorders of the male genital organs: Secondary | ICD-10-CM

## 2018-01-14 DIAGNOSIS — I10 Essential (primary) hypertension: Secondary | ICD-10-CM

## 2018-01-14 MED ORDER — AMLODIPINE BESYLATE 10 MG PO TABS: 10.0000 mg | ORAL_TABLET | Freq: Every day | ORAL | 3 refills | Status: DC

## 2018-01-14 NOTE — Telephone Encounter (Signed)
I left a message for the patient to call. 

## 2018-01-14 NOTE — Patient Instructions (Addendum)
Let's have you see the urologist Try to follow the DASH guidelines (DASH stands for Dietary Approaches to Stop Hypertension). Try to limit the sodium in your diet to no more than 1,500mg  of sodium per day. Certainly try to not exceed 2,000 mg per day at the very most. Do not add salt when cooking or at the table.  Check the sodium amount on labels when shopping, and choose items lower in sodium when given a choice. Avoid or limit foods that already contain a lot of sodium. Eat a diet rich in fruits and vegetables and whole grains, and try to lose weight if overweight or obese Let's increase your amlodipine to 10 mg daily to help control your blood pressure   DASH Eating Plan DASH stands for "Dietary Approaches to Stop Hypertension." The DASH eating plan is a healthy eating plan that has been shown to reduce high blood pressure (hypertension). It may also reduce your risk for type 2 diabetes, heart disease, and stroke. The DASH eating plan may also help with weight loss. What are tips for following this plan? General guidelines  Avoid eating more than 2,300 mg (milligrams) of salt (sodium) a day. If you have hypertension, you may need to reduce your sodium intake to 1,500 mg a day.  Limit alcohol intake to no more than 1 drink a day for nonpregnant women and 2 drinks a day for men. One drink equals 12 oz of beer, 5 oz of wine, or 1 oz of hard liquor.  Work with your health care provider to maintain a healthy body weight or to lose weight. Ask what an ideal weight is for you.  Get at least 30 minutes of exercise that causes your heart to beat faster (aerobic exercise) most days of the week. Activities may include walking, swimming, or biking.  Work with your health care provider or diet and nutrition specialist (dietitian) to adjust your eating plan to your individual calorie needs. Reading food labels  Check food labels for the amount of sodium per serving. Choose foods with less than 5 percent  of the Daily Value of sodium. Generally, foods with less than 300 mg of sodium per serving fit into this eating plan.  To find whole grains, look for the word "whole" as the first word in the ingredient list. Shopping  Buy products labeled as "low-sodium" or "no salt added."  Buy fresh foods. Avoid canned foods and premade or frozen meals. Cooking  Avoid adding salt when cooking. Use salt-free seasonings or herbs instead of table salt or sea salt. Check with your health care provider or pharmacist before using salt substitutes.  Do not fry foods. Cook foods using healthy methods such as baking, boiling, grilling, and broiling instead.  Cook with heart-healthy oils, such as olive, canola, soybean, or sunflower oil. Meal planning   Eat a balanced diet that includes: ? 5 or more servings of fruits and vegetables each day. At each meal, try to fill half of your plate with fruits and vegetables. ? Up to 6-8 servings of whole grains each day. ? Less than 6 oz of lean meat, poultry, or fish each day. A 3-oz serving of meat is about the same size as a deck of cards. One egg equals 1 oz. ? 2 servings of low-fat dairy each day. ? A serving of nuts, seeds, or beans 5 times each week. ? Heart-healthy fats. Healthy fats called Omega-3 fatty acids are found in foods such as flaxseeds and coldwater fish, like  sardines, salmon, and mackerel.  Limit how much you eat of the following: ? Canned or prepackaged foods. ? Food that is high in trans fat, such as fried foods. ? Food that is high in saturated fat, such as fatty meat. ? Sweets, desserts, sugary drinks, and other foods with added sugar. ? Full-fat dairy products.  Do not salt foods before eating.  Try to eat at least 2 vegetarian meals each week.  Eat more home-cooked food and less restaurant, buffet, and fast food.  When eating at a restaurant, ask that your food be prepared with less salt or no salt, if possible. What foods are  recommended? The items listed may not be a complete list. Talk with your dietitian about what dietary choices are best for you. Grains Whole-grain or whole-wheat bread. Whole-grain or whole-wheat pasta. Brown rice. [place]row. Bulgur. Whole-grain and low-sodium cereals. Pita bread. Low-fat, low-sodium crackers. Whole-wheat flour tortillas. Vegetables Fresh or frozen vegetables (raw, steamed, roasted, or grilled). Low-sodium or reduced-sodium tomato and vegetable juice. Low-sodium or reduced-sodium tomato sauce and tomato paste. Low-sodium or reduced-sodium canned vegetables. Fruits All fresh, dried, or frozen fruit. Canned fruit in natural juice (without added sugar). Meat and other protein foods Skinless chicken or [place]. Ground chicken or [place]. Pork with fat trimmed off. Fish and seafood. Egg whites. Dried beans, peas, or lentils. Unsalted nuts, nut butters, and seeds. Unsalted canned beans. Lean cuts of beef with fat trimmed off. Low-sodium, lean deli meat. Dairy Low-fat (1%) or fat-free (skim) milk. Fat-free, low-fat, or reduced-fat cheeses. Nonfat, low-sodium ricotta or cottage cheese. Low-fat or nonfat yogurt. Low-fat, low-sodium cheese. Fats and oils Soft margarine without trans fats. Vegetable oil. Low-fat, reduced-fat, or light mayonnaise and salad dressings (reduced-sodium). Canola, safflower, olive, soybean, and sunflower oils. Avocado. Seasoning and other foods Herbs. Spices. Seasoning mixes without salt. Unsalted popcorn and pretzels. Fat-free sweets. What foods are not recommended? The items listed may not be a complete list. Talk with your dietitian about what dietary choices are best for you. Grains Baked goods made with fat, such as croissants, muffins, or some breads. Dry pasta or rice meal packs. Vegetables Creamed or fried vegetables. Vegetables in a cheese sauce. Regular canned vegetables (not low-sodium or reduced-sodium). Regular canned tomato sauce and paste (not  low-sodium or reduced-sodium). Regular tomato and vegetable juice (not low-sodium or reduced-sodium). Angie Fava. Olives. Fruits Canned fruit in a light or heavy syrup. Fried fruit. Fruit in cream or butter sauce. Meat and other protein foods Fatty cuts of meat. Ribs. Fried meat. Berniece Salines. Sausage. Bologna and other processed lunch meats. Salami. Fatback. Hotdogs. Bratwurst. Salted nuts and seeds. Canned beans with added salt. Canned or smoked fish. Whole eggs or egg yolks. Chicken or [place] with skin. Dairy Whole or 2% milk, cream, and half-and-half. Whole or full-fat cream cheese. Whole-fat or sweetened yogurt. Full-fat cheese. Nondairy creamers. Whipped toppings. Processed cheese and cheese spreads. Fats and oils Butter. Stick margarine. Lard. Shortening. Ghee. Bacon fat. Tropical oils, such as coconut, palm kernel, or palm oil. Seasoning and other foods Salted popcorn and pretzels. Onion salt, garlic salt, seasoned salt, table salt, and sea salt. Worcestershire sauce. Tartar sauce. Barbecue sauce. Teriyaki sauce. Soy sauce, including reduced-sodium. Steak sauce. Canned and packaged gravies. Fish sauce. Oyster sauce. Cocktail sauce. Horseradish that you find on the shelf. Ketchup. Mustard. Meat flavorings and tenderizers. Bouillon cubes. Hot sauce and Tabasco sauce. Premade or packaged marinades. Premade or packaged taco seasonings. Relishes. Regular salad dressings. Where to find more information:  National Heart, Lung, and Blood Institute: https://wilson-eaton.com/  American Heart Association: www.heart.org Summary  The DASH eating plan is a healthy eating plan that has been shown to reduce high blood pressure (hypertension). It may also reduce your risk for type 2 diabetes, heart disease, and stroke.  With the DASH eating plan, you should limit salt (sodium) intake to 2,300 mg a day. If you have hypertension, you may need to reduce your sodium intake to 1,500 mg a day.  When on the DASH eating plan,  aim to eat more fresh fruits and vegetables, whole grains, lean proteins, low-fat dairy, and heart-healthy fats.  Work with your health care provider or diet and nutrition specialist (dietitian) to adjust your eating plan to your individual calorie needs. This information is not intended to replace advice given to you by your health care provider. Make sure you discuss any questions you have with your health care provider. Document Released: 08/27/2011 Document Revised: 08/31/2016 Document Reviewed: 08/31/2016 Elsevier Interactive Patient Education  [employer].

## 2018-01-14 NOTE — Progress Notes (Signed)
BP (!) 160/82   Pulse 65   Temp 98.5 F (36.9 C) (Oral)   Resp 14   Ht 6' (1.829 m)   Wt 240 lb 3.2 oz (109 kg)   SpO2 93%   BMI 32.58 kg/m    Subjective:    [name], male    DOB: [birth year], 78 y.o.   MRN: [id number]  HPI: Jesus Dillon is a 77 y.o. male  Chief Complaint  Patient presents with  . Groin Swelling    left , denies any pain.  But has recently lifted garage door heavy    HPI Patient is here for an acute visit Has had swelling on the left side of scrotum for a while; no pain with that; fills through the day Then about 30 days ago, was lifting a garage door and it was heavy No problems with urination; no blood in the urine No hx of hernia in the family BP a little elevated at check-in; recheck was higher (but after genital exam); patient has been taking 5 mg amlodipine He saw the cardiologist, had stress test, everything checked out okay he says  Depression screen [facility] 2/9 01/14/2018 12/20/2017 10/21/2017 06/18/2017 02/16/2017  Decreased Interest 0 0 0 0 0  Down, Depressed, Hopeless 0 0 0 0 0  PHQ - 2 Score 0 0 0 0 0    Relevant past medical, surgical, family and social history reviewed Past Medical History:  Diagnosis Date  . Gout   . HBP (high blood pressure)   . Heart murmur   . Hyperlipidemia   . Tremors of nervous system    History reviewed. No pertinent surgical history. Family History  Problem Relation Age of Onset  . Hypertension Mother   . Dementia Mother   . Angina Mother   . Diabetes Brother   . Hypertension Brother   . Blindness Maternal Grandmother    Social History   Tobacco Use  . Smoking status: Never Smoker  . Smokeless tobacco: Never Used  Substance Use Topics  . Alcohol use: No    Alcohol/week: 0.0 oz  . Drug use: No    Interim medical history since last visit reviewed. Allergies and medications reviewed  Review of Systems Per HPI unless specifically indicated above     Objective:    BP (!) 160/82    Pulse 65   Temp 98.5 F (36.9 C) (Oral)   Resp 14   Ht 6' (1.829 m)   Wt 240 lb 3.2 oz (109 kg)   SpO2 93%   BMI 32.58 kg/m   Wt Readings from Last 3 Encounters:  01/14/18 240 lb 3.2 oz (109 kg)  12/22/17 245 lb 8 oz (111.4 kg)  12/20/17 246 lb 11.2 oz (111.9 kg)    Physical Exam  Constitutional: He appears well-developed and well-nourished. No distress.  Eyes: No scleral icterus.  Cardiovascular: Regular rhythm.  Pulmonary/Chest: Effort normal.  Genitourinary:     Genitourinary Comments: Firm swelling/enlargement of the left side scrotum with mass effect pushing the phallus to the right; nontender  Neurological: He is alert.  Skin: No pallor.  Psychiatric: He has a normal mood and affect.    Results for orders placed or performed during the hospital encounter of 01/12/18  NM Myocar Multi W/Spect W/Wall Motion / EF  Result Value Ref Range   Rest HR 58 bpm   Rest BP 149/72 mmHg   Exercise duration (sec) 39 sec   Percent HR 88 %  Exercise duration (min) 7 min   Estimated workload 8.2 METS   Peak HR 129 bpm   Peak BP 206/78 mmHg   SSS 0    SRS 5    SDS 0    TID 0.97    LV sys vol 20 mL   LV dias vol 76 62 - 150 mL      Assessment & Plan:   Problem List Items Addressed This Visit      Cardiovascular and Mediastinum   HBP (high blood pressure)    Increase amlodipine from 5 mg to 10 mg; patient to monitor BP and contact me if not consistently under 140 mmHg on top      Relevant Medications   amLODipine (NORVASC) 10 MG tablet    Other Visit Diagnoses    Scrotal swelling    -  Primary   order [place] and refer to urologist   Relevant Orders   Ambulatory referral to Urology   US SCROTUM W/DOPPLER       Follow up plan: Return if symptoms worsen or fail to improve.  An after-visit summary was printed and given to the patient at [place].  Please see the patient instructions which may contain other information and recommendations beyond what is mentioned  above in the assessment and plan.  Meds ordered this encounter  Medications  . amLODipine (NORVASC) 10 MG tablet    Sig: Take 1 tablet (10 mg total) by mouth daily.    Dispense:  90 tablet    Refill:  3    Orders Placed This Encounter  Procedures  . US SCROTUM W/DOPPLER  . Ambulatory referral to Urology

## 2018-01-14 NOTE — Assessment & Plan Note (Signed)
Increase amlodipine from 5 mg to 10 mg; patient to monitor BP and contact me if not consistently under 140 mmHg on top

## 2018-01-14 NOTE — Telephone Encounter (Signed)
Patient wife calling  Would like to make sure that Dr. Sanda Klein will have results of stress test Please call to clarify

## 2018-01-17 NOTE — Telephone Encounter (Signed)
Spoke with patients wife per release form and advised that Dr. Sanda Klein has stress test results and we routed them over to her as well. She verbalized understanding with no further questions at this time.

## 2018-01-17 NOTE — Telephone Encounter (Signed)
Left voicemail message to call back  

## 2018-01-17 NOTE — Telephone Encounter (Signed)
Patient wife returning call Routed stress test results to PCP per patient request

## 2018-01-19 ENCOUNTER — Ambulatory Visit
Admission: RE | Admit: 2018-01-19 | Discharge: 2018-01-19 | Disposition: A | Discharge status: Home / Self Care | Payer: Medicare Other | Source: Ambulatory Visit | Attending: Family Medicine | Admitting: Family Medicine

## 2018-01-19 DIAGNOSIS — N5089 Other specified disorders of the male genital organs: Secondary | ICD-10-CM

## 2018-01-19 DIAGNOSIS — N433 Hydrocele, unspecified: Secondary | ICD-10-CM | POA: Insufficient documentation

## 2018-01-25 ENCOUNTER — Ambulatory Visit: Payer: Medicare Other | Admitting: Family Medicine

## 2018-02-10 ENCOUNTER — Telehealth: Payer: Self-pay

## 2018-02-10 ENCOUNTER — Ambulatory Visit: Payer: Medicare Other

## 2018-02-10 NOTE — Telephone Encounter (Signed)
Per Leodis Sias: situation has been resolved and patient has been scheduled to see Dr. Bernardo Heater tomorrow at 1pm.

## 2018-02-10 NOTE — Telephone Encounter (Signed)
Sharyn Lull,  Do you know anything about this?  Copied from [place] [phone]. Topic: Referral - Request >> Feb 10, 2018  9:46 AM Jesus Dillon, NT wrote: Reason for CRM: Patient calling and is requesting a new Urology referral. [facility] called last night to cancel his appointment for today. States he would like another urologist as soon as possible due to the condition getting worse. States that he is comfortable, it is just getting bigger. States that the urologist could be in [place] if needed. Patient would also like a call back from Dr Sanda Klein. CB#: [phone]

## 2018-02-10 NOTE — Telephone Encounter (Signed)
Yes, can you call me please  [phone]  Sharyn Lull

## 2018-02-11 ENCOUNTER — Ambulatory Visit (INDEPENDENT_AMBULATORY_CARE_PROVIDER_SITE_OTHER): Payer: Medicare Other | Admitting: Urology

## 2018-02-11 ENCOUNTER — Encounter: Payer: Self-pay | Admitting: Urology

## 2018-02-11 VITALS — BP 194/82 | HR 67 | Ht 72.0 in | Wt 244.0 lb

## 2018-02-11 DIAGNOSIS — N430 Encysted hydrocele: Secondary | ICD-10-CM

## 2018-02-11 LAB — URINALYSIS, COMPLETE
BILIRUBIN UA: NEGATIVE
GLUCOSE, UA: NEGATIVE
KETONES UA: NEGATIVE
Leukocytes, UA: NEGATIVE
NITRITE UA: NEGATIVE
Protein, UA: NEGATIVE
RBC UA: NEGATIVE
Specific Gravity, UA: 1.015 (ref 1.005–1.030)
UUROB: 1 mg/dL (ref 0.2–1.0)
pH, UA: 7 (ref 5.0–7.5)

## 2018-02-11 NOTE — Progress Notes (Signed)
02/11/2018 1:19 PM   Jesus Dillon [birth year] [id number]  Referring provider: Arnetha Courser, MD [address] [place], [place] [address]  Chief Complaint  Patient presents with  . Other    scrotal swelling    HPI: Jesus Dillon is a 77 year old male seen at the request of Dr. Sanda Klein for evaluation of left hemiscrotal swelling.  He states his left hemiscrotum has always been slightly larger than his right however in late March 2019 he was lifting a heavy garage door and the next day noted increased swelling of his left hemiscrotum.  The size has been stable over the past 6 weeks.  A scrotal ultrasound was performed On 01/19/2018 which showed normal-appearing testes bilaterally.  There was a large left hydrocele and a moderate right hydrocele noted.  He has mild discomfort.  It is worse with changes in position and tighter clothing.  He has no voiding complaints.  A PSA performed in 2018 was 1.9.  PMH: Past Medical History:  Diagnosis Date  . Arthritis   . Gout   . HBP (high blood pressure)   . Heart murmur   . Hyperlipidemia   . Tremors of nervous system     Surgical History: Past Surgical History:  Procedure Laterality Date  . ANAL FISSURECTOMY      Home Medications:  Allergies as of 02/11/2018      Reactions   Penicillins Rash      Medication List        Accurate as of 02/11/18  1:19 PM. Always use your most recent med list.          allopurinol 100 MG tablet Commonly known as:  ZYLOPRIM Take 2 tablets (200 mg total) by mouth daily.   amLODipine 10 MG tablet Commonly known as:  NORVASC Take 1 tablet (10 mg total) by mouth daily.   aspirin EC 81 MG tablet Take 1 tablet (81 mg total) by mouth daily. No NSAIDs for at least one hour   atorvastatin 20 MG tablet Commonly known as:  LIPITOR Take 1 tablet (20 mg total) by mouth at bedtime.   cholecalciferol 1000 units tablet Commonly known as:  VITAMIN D Take 1,000 Units by mouth daily.     diclofenac sodium 1 % Gel Commonly known as:  VOLTAREN Apply 4 g topically 4 (four) times daily.   gabapentin 300 MG capsule Commonly known as:  NEURONTIN Take 1 capsule (300 mg total) by mouth 3 (three) times daily.   propranolol 10 MG tablet Commonly known as:  INDERAL Take 1 tablet (10 mg total) by mouth 3 (three) times daily.   telmisartan 80 MG tablet Commonly known as:  MICARDIS Take 1 tablet (80 mg total) by mouth daily. (this replaces losartan); for blood pressure       Allergies:  Allergies  Allergen Reactions  . Penicillins Rash    Family History: Family History  Problem Relation Age of Onset  . Hypertension Mother   . Dementia Mother   . Angina Mother   . Diabetes Brother   . Hypertension Brother   . Blindness Maternal Grandmother     Social History:  reports that he has never smoked. He has never used smokeless tobacco. He reports that he does not drink alcohol or use drugs.  ROS: UROLOGY Frequent Urination?: Yes Hard to postpone urination?: Yes Burning/pain with urination?: No Get up at night to urinate?: Yes Leakage of urine?: No Urine stream starts and stops?: No Trouble starting stream?:  No Do you have to strain to urinate?: No Blood in urine?: No Urinary tract infection?: No Sexually transmitted disease?: No Injury to kidneys or bladder?: No Painful intercourse?: No Weak stream?: No Erection problems?: No Penile pain?: No  Gastrointestinal Nausea?: No Vomiting?: No Indigestion/heartburn?: No Diarrhea?: No Constipation?: No  Constitutional Fever: No Night sweats?: No Weight loss?: No Fatigue?: No  Skin Skin rash/lesions?: No Itching?: No  Eyes Blurred vision?: No Double vision?: No  Ears/Nose/Throat Sore throat?: No Sinus problems?: No  Hematologic/Lymphatic Swollen glands?: No Easy bruising?: No  Cardiovascular Leg swelling?: No Chest pain?: No  Respiratory Cough?: No Shortness of breath?:  No  Endocrine Excessive thirst?: No  Musculoskeletal Back pain?: No Joint pain?: No  Neurological Headaches?: No Dizziness?: No  Psychologic Depression?: No Anxiety?: No  Physical Exam: BP (!) 194/82   Pulse 67   Ht 6' (1.829 m)   Wt 244 lb (110.7 kg)   BMI 33.09 kg/m   Constitutional:  Alert and oriented, No acute distress. HEENT: [place] AT, moist mucus membranes.  Trachea midline, no masses. Cardiovascular: No clubbing, cyanosis, or edema.  RRR Respiratory: Normal respiratory effort, no increased work of breathing.  Clear GI: Abdomen is soft, nontender, nondistended, no abdominal masses GU: No CVA tenderness.  Penis is without lesions.  Right testis is descended and palpably normal.  No significant right hydrocele on exam.  Moderate left hydrocele present.  The left testis is not palpable.  There is no evidence of an inguinal hernia. Lymph: No cervical or inguinal lymphadenopathy. Skin: No rashes, bruises or suspicious lesions. Neurologic: Grossly intact, no focal deficits, moving all 4 extremities. Psychiatric: Normal mood and affect.   Pertinent Imaging: Scrotal ultrasound was reviewed.   Assessment & Plan:   77 year old male with a moderate left hydrocele.  He has mild symptoms however states it is bothersome enough that he desires treatment.  Management options were discussed including observation, aspiration with or without sclerotherapy and hydrocelectomy.  The pros and cons of each treatment were discussed.  He was informed there is a high recurrence rate with aspiration.  He is leaning toward scheduling hydrocelectomy however would like to discuss this with his wife.  The procedure was discussed in detail including potential risks of bleeding/hematoma, infection/abscess, recurrence, chronic scrotal pain and testicular atrophy.  The postoperative care and follow-up was also reviewed.   Abbie Sons, [place] [address],  [place], [place] [address] [phone]

## 2018-02-11 NOTE — H&P (View-Only) (Signed)
02/11/2018 1:19 PM   Jesus Dillon 03/19/1941 [id number]  Referring provider: Arnetha Courser, MD [address] [place], [place] [address]  Chief Complaint  Patient presents with  . Other    scrotal swelling    HPI: Jesus Dillon is a 77 year old male seen at the request of Dr. Sanda Klein for evaluation of left hemiscrotal swelling.  He states his left hemiscrotum has always been slightly larger than his right however in late March 2019 he was lifting a heavy garage door and the next day noted increased swelling of his left hemiscrotum.  The size has been stable over the past 6 weeks.  A scrotal ultrasound was performed On 01/19/2018 which showed normal-appearing testes bilaterally.  There was a large left hydrocele and a moderate right hydrocele noted.  He has mild discomfort.  It is worse with changes in position and tighter clothing.  He has no voiding complaints.  A PSA performed in 2018 was 1.9.  PMH: Past Medical History:  Diagnosis Date  . Arthritis   . Gout   . HBP (high blood pressure)   . Heart murmur   . Hyperlipidemia   . Tremors of nervous system     Surgical History: Past Surgical History:  Procedure Laterality Date  . ANAL FISSURECTOMY      Home Medications:  Allergies as of 02/11/2018      Reactions   Penicillins Rash      Medication List        Accurate as of 02/11/18  1:19 PM. Always use your most recent med list.          allopurinol 100 MG tablet Commonly known as:  ZYLOPRIM Take 2 tablets (200 mg total) by mouth daily.   amLODipine 10 MG tablet Commonly known as:  NORVASC Take 1 tablet (10 mg total) by mouth daily.   aspirin EC 81 MG tablet Take 1 tablet (81 mg total) by mouth daily. No NSAIDs for at least one hour   atorvastatin 20 MG tablet Commonly known as:  LIPITOR Take 1 tablet (20 mg total) by mouth at bedtime.   cholecalciferol 1000 units tablet Commonly known as:  VITAMIN D Take 1,000 Units by mouth daily.     diclofenac sodium 1 % Gel Commonly known as:  VOLTAREN Apply 4 g topically 4 (four) times daily.   gabapentin 300 MG capsule Commonly known as:  NEURONTIN Take 1 capsule (300 mg total) by mouth 3 (three) times daily.   propranolol 10 MG tablet Commonly known as:  INDERAL Take 1 tablet (10 mg total) by mouth 3 (three) times daily.   telmisartan 80 MG tablet Commonly known as:  MICARDIS Take 1 tablet (80 mg total) by mouth daily. (this replaces losartan); for blood pressure       Allergies:  Allergies  Allergen Reactions  . Penicillins Rash    Family History: Family History  Problem Relation Age of Onset  . Hypertension Mother   . Dementia Mother   . Angina Mother   . Diabetes Brother   . Hypertension Brother   . Blindness Maternal Grandmother     Social History:  reports that he has never smoked. He has never used smokeless tobacco. He reports that he does not drink alcohol or use drugs.  ROS: UROLOGY Frequent Urination?: Yes Hard to postpone urination?: Yes Burning/pain with urination?: No Get up at night to urinate?: Yes Leakage of urine?: No Urine stream starts and stops?: No Trouble starting stream?:  No Do you have to strain to urinate?: No Blood in urine?: No Urinary tract infection?: No Sexually transmitted disease?: No Injury to kidneys or bladder?: No Painful intercourse?: No Weak stream?: No Erection problems?: No Penile pain?: No  Gastrointestinal Nausea?: No Vomiting?: No Indigestion/heartburn?: No Diarrhea?: No Constipation?: No  Constitutional Fever: No Night sweats?: No Weight loss?: No Fatigue?: No  Skin Skin rash/lesions?: No Itching?: No  Eyes Blurred vision?: No Double vision?: No  Ears/Nose/Throat Sore throat?: No Sinus problems?: No  Hematologic/Lymphatic Swollen glands?: No Easy bruising?: No  Cardiovascular Leg swelling?: No Chest pain?: No  Respiratory Cough?: No Shortness of breath?:  No  Endocrine Excessive thirst?: No  Musculoskeletal Back pain?: No Joint pain?: No  Neurological Headaches?: No Dizziness?: No  Psychologic Depression?: No Anxiety?: No  Physical Exam: BP (!) 194/82   Pulse 67   Ht 6' (1.829 m)   Wt 244 lb (110.7 kg)   BMI 33.09 kg/m   Constitutional:  Alert and oriented, No acute distress. HEENT: [place] AT, moist mucus membranes.  Trachea midline, no masses. Cardiovascular: No clubbing, cyanosis, or edema.  RRR Respiratory: Normal respiratory effort, no increased work of breathing.  Clear GI: Abdomen is soft, nontender, nondistended, no abdominal masses GU: No CVA tenderness.  Penis is without lesions.  Right testis is descended and palpably normal.  No significant right hydrocele on exam.  Moderate left hydrocele present.  The left testis is not palpable.  There is no evidence of an inguinal hernia. Lymph: No cervical or inguinal lymphadenopathy. Skin: No rashes, bruises or suspicious lesions. Neurologic: Grossly intact, no focal deficits, moving all 4 extremities. Psychiatric: Normal mood and affect.   Pertinent Imaging: Scrotal ultrasound was reviewed.   Assessment & Plan:   77 year old male with a moderate left hydrocele.  He has mild symptoms however states it is bothersome enough that he desires treatment.  Management options were discussed including observation, aspiration with or without sclerotherapy and hydrocelectomy.  The pros and cons of each treatment were discussed.  He was informed there is a high recurrence rate with aspiration.  He is leaning toward scheduling hydrocelectomy however would like to discuss this with his wife.  The procedure was discussed in detail including potential risks of bleeding/hematoma, infection/abscess, recurrence, chronic scrotal pain and testicular atrophy.  The postoperative care and follow-up was also reviewed.   Abbie Sons, [place] [address]  [place], [place] [address] [phone]

## 2018-02-15 ENCOUNTER — Telehealth: Payer: Self-pay | Admitting: Radiology

## 2018-02-15 NOTE — Telephone Encounter (Signed)
Pt would like to proceed with left hydrocelectomy. Will need orders please. Order sheet left on your desk.

## 2018-02-16 ENCOUNTER — Telehealth: Payer: Self-pay | Admitting: Cardiovascular Disease

## 2018-02-16 NOTE — Telephone Encounter (Signed)
° °  [facility] Pre-operative Risk Assessment    Request for surgical clearance:  1. What type of surgery is being performed? Left hydrocelectomy   2. When is this surgery scheduled? 03/01/2018   3. What type of clearance is required (medical clearance vs. Pharmacy clearance to hold med vs. Both)? both  4. Are there any medications that need to be held prior to surgery and how long? Hold asa 81 mg - 7 days prior to surgery   5. Practice name and name of physician performing surgery?  [facility] - Dr. John Giovanni   6. What is your office phone number [phone]    7.   What is your office fax number [phone]  8.   Anesthesia type (None, local, MAC, general) ?    Jesus Dillon 02/16/2018, 1:52 PM  _________________________________________________________________   (provider comments below)

## 2018-02-17 ENCOUNTER — Other Ambulatory Visit: Payer: Self-pay | Admitting: Radiology

## 2018-02-17 DIAGNOSIS — N430 Encysted hydrocele: Secondary | ICD-10-CM

## 2018-02-20 NOTE — Telephone Encounter (Signed)
Acceptable risk for procedure Ok to stop asa 7 days prior of needed per urology

## 2018-02-21 NOTE — Telephone Encounter (Signed)
Clearance routed through Epic to number listed.

## 2018-02-24 ENCOUNTER — Other Ambulatory Visit: Payer: Self-pay

## 2018-02-24 ENCOUNTER — Encounter
Admission: RE | Admit: 2018-02-24 | Discharge: 2018-02-24 | Disposition: A | Discharge status: Home / Self Care | Payer: Medicare Other | Source: Ambulatory Visit | Attending: Urology | Admitting: Urology

## 2018-02-24 HISTORY — DX: Gastro-esophageal reflux disease without esophagitis: K21.9

## 2018-02-24 NOTE — Patient Instructions (Signed)
Your procedure is scheduled on: Tues 6/11 Report to Day Surgery. To find out your arrival time please call [phone] between 1PM - 3PM on Mon6/10.  Remember: Instructions that are not followed completely may result in serious medical risk, up to and including death, or upon the discretion of your surgeon and anesthesiologist your surgery may need to be rescheduled.     _X__ 1. Do not eat food after midnight the night before your procedure.                 No gum chewing or hard candies. You may drink clear liquids up to 2 hours                 before you are scheduled to arrive for your surgery- DO not drink clear                 liquids within 2 hours of the start of your surgery.                 Clear Liquids include:  water, apple juice without pulp, clear carbohydrate                 drink such as Clearfast of Gartorade, Black Coffee or Tea (Do not add                 anything to coffee or tea).  __X__2.  On the morning of surgery brush your teeth with toothpaste and water, you  may rinse your mouth with mouthwash if you wish.  Do not swallow any  toothpaste of mouthwash.     _X__ 3.  No Alcohol for 24 hours before or after surgery.   ___ 4.  Do Not Smoke or use e-cigarettes For 24 Hours Prior to Your Surgery.                 Do not use any chewable tobacco products for at least 6 hours prior to                 surgery.  ____  5.  Bring all medications with you on the day of surgery if instructed.   __x__  6.  Notify your doctor if there is any change in your medical condition      (cold, fever, infections).     Do not wear jewelry, make-up, hairpins, clips or nail polish. Do not wear lotions, powders, or perfumes. You may wear deodorant. Do not shave 48 hours prior to surgery. Men may shave face and neck. Do not bring valuables to the hospital.    [facility] is not responsible for any belongings or valuables.  Contacts, dentures or bridgework may  not be worn into surgery. Leave your suitcase in the car. After surgery it may be brought to your room. For patients admitted to the hospital, discharge time is determined by your treatment team.   Patients discharged the day of surgery will not be allowed to drive home.   Please read over the following fact sheets that you were given:    _x___ Take these medicines the morning of surgery with A SIP OF WATER:    1.allopurinol (ZYLOPRIM) 100 MG tablet   2. amLODipine (NORVASC) 10 MG tablet  3. gabapentin (NEURONTIN) 300 MG capsule  4.propranolol (INDERAL) 10 MG tablet  5.  6.  ____ Fleet Enema (as directed)   ____ Use CHG Soap as directed  ____ Use inhalers on the day of  surgery  ____ Stop metformin 2 days prior to surgery    ____ Take 1/2 of usual insulin dose the night before surgery. No insulin the morning          of surgery.   _x___ Stopped  aspirin already  ____ Stop Anti-inflammatories on    ____ Stop supplements until after surgery.    ____ Bring C-Pap to the hospital.

## 2018-02-28 MED ORDER — CEFAZOLIN SODIUM-DEXTROSE 2-4 GM/100ML-% IV SOLN
2.0000 g | INTRAVENOUS | Status: AC
  Administered 2018-03-01: 2 g via INTRAVENOUS

## 2018-03-01 ENCOUNTER — Other Ambulatory Visit: Payer: Self-pay

## 2018-03-01 ENCOUNTER — Encounter
Admission: RE | Disposition: A | Discharge status: Home / Self Care | Payer: Self-pay | Source: Ambulatory Visit | Attending: Urology

## 2018-03-01 ENCOUNTER — Ambulatory Visit
Admission: RE | Admit: 2018-03-01 | Discharge: 2018-03-01 | Disposition: A | Discharge status: Home / Self Care | Payer: Medicare Other | Source: Ambulatory Visit | Attending: Urology | Admitting: Urology

## 2018-03-01 ENCOUNTER — Ambulatory Visit: Payer: Medicare Other | Admitting: Anesthesiology

## 2018-03-01 ENCOUNTER — Encounter: Payer: Self-pay | Admitting: *Deleted

## 2018-03-01 DIAGNOSIS — I10 Essential (primary) hypertension: Secondary | ICD-10-CM | POA: Diagnosis not present

## 2018-03-01 DIAGNOSIS — M109 Gout, unspecified: Secondary | ICD-10-CM | POA: Insufficient documentation

## 2018-03-01 DIAGNOSIS — N433 Hydrocele, unspecified: Secondary | ICD-10-CM

## 2018-03-01 DIAGNOSIS — Z7982 Long term (current) use of aspirin: Secondary | ICD-10-CM | POA: Diagnosis not present

## 2018-03-01 DIAGNOSIS — N430 Encysted hydrocele: Secondary | ICD-10-CM

## 2018-03-01 DIAGNOSIS — E785 Hyperlipidemia, unspecified: Secondary | ICD-10-CM | POA: Insufficient documentation

## 2018-03-01 DIAGNOSIS — Z79899 Other long term (current) drug therapy: Secondary | ICD-10-CM | POA: Diagnosis not present

## 2018-03-01 DIAGNOSIS — R251 Tremor, unspecified: Secondary | ICD-10-CM | POA: Diagnosis not present

## 2018-03-01 DIAGNOSIS — M1990 Unspecified osteoarthritis, unspecified site: Secondary | ICD-10-CM | POA: Insufficient documentation

## 2018-03-01 HISTORY — PX: HYDROCELE EXCISION: SHX482

## 2018-03-01 SURGERY — HYDROCELECTOMY
Anesthesia: General | Laterality: Left | Wound class: Clean Contaminated

## 2018-03-01 MED ORDER — ONDANSETRON HCL 4 MG/2ML IJ SOLN: 4.0000 mg | Freq: Once | INTRAMUSCULAR | Status: DC | PRN

## 2018-03-01 MED ORDER — ONDANSETRON HCL 4 MG/2ML IJ SOLN
INTRAMUSCULAR | Status: DC | PRN
  Administered 2018-03-01: 4 mg via INTRAVENOUS

## 2018-03-01 MED ORDER — BACITRACIN ZINC 500 UNIT/GM EX OINT
TOPICAL_OINTMENT | CUTANEOUS | Status: AC
  Filled 2018-03-01: qty 28.35

## 2018-03-01 MED ORDER — PROPOFOL 10 MG/ML IV BOLUS
INTRAVENOUS | Status: DC | PRN
  Administered 2018-03-01: 30 mg via INTRAVENOUS
  Administered 2018-03-01: 160 mg via INTRAVENOUS

## 2018-03-01 MED ORDER — BACITRACIN ZINC 500 UNIT/GM EX OINT
TOPICAL_OINTMENT | CUTANEOUS | Status: DC | PRN
  Administered 2018-03-01: 1 via TOPICAL

## 2018-03-01 MED ORDER — CEFAZOLIN SODIUM-DEXTROSE 2-4 GM/100ML-% IV SOLN
INTRAVENOUS | Status: AC
  Filled 2018-03-01: qty 100

## 2018-03-01 MED ORDER — SULFAMETHOXAZOLE-TRIMETHOPRIM 800-160 MG PO TABS: 1.0000 | ORAL_TABLET | Freq: Two times a day (BID) | ORAL | 0 refills | Status: DC

## 2018-03-01 MED ORDER — ACETAMINOPHEN 10 MG/ML IV SOLN
INTRAVENOUS | Status: DC | PRN
  Administered 2018-03-01: 1000 mg via INTRAVENOUS

## 2018-03-01 MED ORDER — PROPOFOL 10 MG/ML IV BOLUS
INTRAVENOUS | Status: AC
  Filled 2018-03-01: qty 20

## 2018-03-01 MED ORDER — BUPIVACAINE HCL (PF) 0.5 % IJ SOLN
INTRAMUSCULAR | Status: AC
  Filled 2018-03-01: qty 30

## 2018-03-01 MED ORDER — HYDROCODONE-ACETAMINOPHEN 5-325 MG PO TABS: 1.0000 | ORAL_TABLET | ORAL | 0 refills | Status: DC | PRN

## 2018-03-01 MED ORDER — LACTATED RINGERS IV SOLN
INTRAVENOUS | Status: DC
  Administered 2018-03-01 (×2): via INTRAVENOUS

## 2018-03-01 MED ORDER — MIDAZOLAM HCL 2 MG/2ML IJ SOLN
INTRAMUSCULAR | Status: DC | PRN
  Administered 2018-03-01: 2 mg via INTRAVENOUS

## 2018-03-01 MED ORDER — SEVOFLURANE IN SOLN
RESPIRATORY_TRACT | Status: AC
  Filled 2018-03-01: qty 250

## 2018-03-01 MED ORDER — MIDAZOLAM HCL 2 MG/2ML IJ SOLN
INTRAMUSCULAR | Status: AC
  Filled 2018-03-01: qty 2

## 2018-03-01 MED ORDER — LIDOCAINE HCL (CARDIAC) PF 100 MG/5ML IV SOSY
PREFILLED_SYRINGE | INTRAVENOUS | Status: DC | PRN
  Administered 2018-03-01: 40 mg via INTRAVENOUS

## 2018-03-01 MED ORDER — FENTANYL CITRATE (PF) 100 MCG/2ML IJ SOLN
INTRAMUSCULAR | Status: DC | PRN
  Administered 2018-03-01: 50 ug via INTRAVENOUS

## 2018-03-01 MED ORDER — DEXAMETHASONE SODIUM PHOSPHATE 10 MG/ML IJ SOLN
INTRAMUSCULAR | Status: DC | PRN
  Administered 2018-03-01: 10 mg via INTRAVENOUS

## 2018-03-01 MED ORDER — FENTANYL CITRATE (PF) 100 MCG/2ML IJ SOLN: 25.0000 ug | INTRAMUSCULAR | Status: DC | PRN

## 2018-03-01 MED ORDER — FENTANYL CITRATE (PF) 100 MCG/2ML IJ SOLN
INTRAMUSCULAR | Status: AC
  Filled 2018-03-01: qty 2

## 2018-03-01 MED ORDER — LIDOCAINE HCL (PF) 1 % IJ SOLN
INTRAMUSCULAR | Status: AC
Start: 2018-03-01 — End: ?
  Filled 2018-03-01: qty 30

## 2018-03-01 MED ORDER — EPHEDRINE SULFATE 50 MG/ML IJ SOLN
INTRAMUSCULAR | Status: DC | PRN
  Administered 2018-03-01 (×2): 10 mg via INTRAVENOUS

## 2018-03-01 MED ORDER — FAMOTIDINE 20 MG PO TABS
ORAL_TABLET | ORAL | Status: AC
  Administered 2018-03-01: 20 mg via ORAL
  Filled 2018-03-01: qty 1

## 2018-03-01 MED ORDER — BUPIVACAINE HCL 0.5 % IJ SOLN
INTRAMUSCULAR | Status: DC | PRN
  Administered 2018-03-01: 7 mL

## 2018-03-01 MED ORDER — FAMOTIDINE 20 MG PO TABS
20.0000 mg | ORAL_TABLET | Freq: Once | ORAL | Status: AC
  Administered 2018-03-01: 20 mg via ORAL

## 2018-03-01 SURGICAL SUPPLY — 37 items
BLADE CLIPPER SURG (BLADE) ×3 IMPLANT
BLADE SURG 15 STRL LF DISP TIS (BLADE) ×1 IMPLANT
BLADE SURG 15 STRL SS (BLADE) ×2
CANISTER SUCT 1200ML W/VALVE (MISCELLANEOUS) ×3 IMPLANT
CHLORAPREP W/TINT 26ML (MISCELLANEOUS) ×3 IMPLANT
DERMABOND ADVANCED (GAUZE/BANDAGES/DRESSINGS)
DERMABOND ADVANCED .7 DNX12 (GAUZE/BANDAGES/DRESSINGS) IMPLANT
DRAIN PENROSE 1/4X12 LTX (DRAIN) ×3 IMPLANT
DRAPE LAPAROTOMY 77X122 PED (DRAPES) ×3 IMPLANT
DRSG GAUZE FLUFF 36X18 (GAUZE/BANDAGES/DRESSINGS) ×3 IMPLANT
ELECT REM PT RETURN 9FT ADLT (ELECTROSURGICAL) ×3
ELECTRODE REM PT RTRN 9FT ADLT (ELECTROSURGICAL) ×1 IMPLANT
GAUZE SPONGE 4X4 12PLY STRL (GAUZE/BANDAGES/DRESSINGS) IMPLANT
GLOVE BIO SURGEON STRL SZ8 (GLOVE) ×3 IMPLANT
GOWN STRL REUS W/ TWL LRG LVL3 (GOWN DISPOSABLE) ×1 IMPLANT
GOWN STRL REUS W/TWL LRG LVL3 (GOWN DISPOSABLE) ×2
GOWN STRL REUS W/TWL XL LVL4 (GOWN DISPOSABLE) ×3 IMPLANT
KIT TURNOVER KIT A (KITS) ×3 IMPLANT
LABEL OR SOLS (LABEL) ×3 IMPLANT
NEEDLE HYPO 22GX1.5 SAFETY (NEEDLE) ×3 IMPLANT
NEEDLE HYPO 25X1 1.5 SAFETY (NEEDLE) ×3 IMPLANT
NS IRRIG 500ML POUR BTL (IV SOLUTION) ×3 IMPLANT
PACK BASIN MINOR ARMC (MISCELLANEOUS) ×3 IMPLANT
SUPPORETR ATHLETIC LG (MISCELLANEOUS) ×1 IMPLANT
SUPPORTER ATHLETIC LG (MISCELLANEOUS) ×3
SUT CHROMIC 3 0 PS 2 (SUTURE) IMPLANT
SUT CHROMIC 3 0 SH 27 (SUTURE) ×9 IMPLANT
SUT ETHILON 3-0 FS-10 30 BLK (SUTURE) ×3
SUT ETHILON NAB PS2 4-0 18IN (SUTURE) ×3 IMPLANT
SUT MNCRL 3-0 UNDYED SH (SUTURE) IMPLANT
SUT MONOCRYL 3-0 UNDYED (SUTURE)
SUT VIC AB 3-0 SH 27 (SUTURE) ×2
SUT VIC AB 3-0 SH 27X BRD (SUTURE) ×1 IMPLANT
SUT VIC AB 4-0 SH 27 (SUTURE) ×2
SUT VIC AB 4-0 SH 27XANBCTRL (SUTURE) ×1 IMPLANT
SUTURE EHLN 3-0 FS-10 30 BLK (SUTURE) ×1 IMPLANT
SYR 10ML LL (SYRINGE) ×3 IMPLANT

## 2018-03-01 NOTE — Anesthesia Postprocedure Evaluation (Signed)
Anesthesia Post Note  Patient: Jesus Dillon  Procedure(s) Performed: HYDROCELECTOMY ADULT (Left )  Patient location during evaluation: PACU Anesthesia Type: General Level of consciousness: awake and alert and oriented Pain management: pain level controlled Vital Signs Assessment: post-procedure vital signs reviewed and stable Respiratory status: spontaneous breathing Cardiovascular status: blood pressure returned to baseline Anesthetic complications: no     Last Vitals:  Vitals:   03/01/18 0927 03/01/18 0935  BP: 138/72   Pulse: (!) 52 (!) 54  Resp: 11 12  Temp:  (!) 35.6 C  SpO2: 99% 100%    Last Pain:  Vitals:   03/01/18 0935  TempSrc: Temporal  PainSc: 0-No pain                 Elianis Fischbach

## 2018-03-01 NOTE — Interval H&P Note (Signed)
History and Physical Interval Note:  03/01/2018 7:32 AM  Jesus Dillon  has presented today for surgery, with the diagnosis of Left hydrocele  The various methods of treatment have been discussed with the patient and family. After consideration of risks, benefits and other options for treatment, the patient has consented to  Procedure(s): HYDROCELECTOMY ADULT (Left) as a surgical intervention .  The patient's history has been reviewed, patient examined, no change in status, stable for surgery.  I have reviewed the patient's chart and labs.  Questions were answered to the patient's satisfaction.     [employer]

## 2018-03-01 NOTE — Transfer of Care (Signed)
Immediate Anesthesia Transfer of Care Note  Patient: Jesus Dillon  Procedure(s) Performed: HYDROCELECTOMY ADULT (Left )  Patient Location: PACU  Anesthesia Type:General  Level of Consciousness: sedated  Airway & Oxygen Therapy: Patient Spontanous Breathing and Patient connected to face mask oxygen  Post-op Assessment: Report given to RN and Post -op Vital signs reviewed and stable  Post vital signs: Reviewed and stable  Last Vitals:  Vitals Value Taken Time  BP 122/76 03/01/2018  8:47 AM  Temp    Pulse 55 03/01/2018  8:49 AM  Resp 12 03/01/2018  8:49 AM  SpO2 100 % 03/01/2018  8:49 AM  Vitals shown include unvalidated device data.  Last Pain:  Vitals:   03/01/18 0607  TempSrc: Oral  PainSc: 0-No pain         Complications: No apparent anesthesia complications

## 2018-03-01 NOTE — Anesthesia Preprocedure Evaluation (Addendum)
Anesthesia Evaluation  Patient identified by MRN, date of birth, ID band Patient awake    Reviewed: Allergy & Precautions, NPO status , Patient's Chart, lab work & pertinent test results, reviewed documented beta blocker date and time   Airway Mallampati: III       Dental   Pulmonary shortness of breath and with exertion,    Pulmonary exam normal        Cardiovascular hypertension, Pt. on medications and Pt. on home beta blockers Normal cardiovascular exam+ Valvular Problems/Murmurs      Neuro/Psych  Neuromuscular disease negative psych ROS   GI/Hepatic Neg liver ROS, GERD  ,  Endo/Other  negative endocrine ROS  Renal/GU negative Renal ROS  negative genitourinary   Musculoskeletal  (+) Arthritis , Osteoarthritis,    Abdominal Normal abdominal exam  (+)   Peds negative pediatric ROS (+)  Hematology negative hematology ROS (+)   Anesthesia Other Findings   Reproductive/Obstetrics                            Anesthesia Physical Anesthesia Plan  ASA: II  Anesthesia Plan: General   Post-op Pain Management:    Induction: Intravenous  PONV Risk Score and Plan:   Airway Management Planned: LMA and Oral ETT  Additional Equipment:   Intra-op Plan:   Post-operative Plan: Extubation in OR  Informed Consent: I have reviewed the patients History and Physical, chart, labs and discussed the procedure including the risks, benefits and alternatives for the proposed anesthesia with the patient or authorized representative who has indicated his/her understanding and acceptance.   Dental advisory given  Plan Discussed with: CRNA and Surgeon  Anesthesia Plan Comments:         Anesthesia Quick Evaluation

## 2018-03-01 NOTE — Op Note (Signed)
Preoperative diagnosis:  1. Left hydrocele  Postoperative diagnosis:  1. Left hydrocele  Procedure: 1. Left hydrocelectomy  Surgeon: Abbie Sons, MD  Anesthesia: General  Complications: None  Intraoperative findings:  Moderate left hydrocele; 150 mL aspirated  EBL: Minimal  Specimens: None  Indication: Jesus Dillon is a 76 y.o. patient with a bothersome left hydrocele who has requested hydrocelectomy.  After reviewing the management options for treatment, he elected to proceed with the above surgical procedure(s). We have discussed the potential benefits and risks of the procedure, side effects of the proposed treatment, the likelihood of the patient achieving the goals of the procedure, and any potential problems that might occur during the procedure or recuperation. Informed consent has been obtained.  Description of procedure:  The patient was taken to the operating room and general anesthesia was induced.  The patient was placed in the dorsal lithotomy position, prepped and draped in the usual sterile fashion, and preoperative antibiotics were administered. A preoperative time-out was performed.   A transverse, midline left hemiscrotal incision was made and carried through the dartos fascia with cautery down to the level of the hydrocele sac.  The hydrocele sac was then separated from the scrotal wall with blunt dissection and delivered into the operative field.  The sac was incised anteriorly and 150 mL of straw-colored fluid was aspirated.  The hydrocele sac was opened further with cautery.  The sac was then excised with cautery leaving a 1 cm rim around the testis and cord structures.  The sac edges were then whipstitched with a running 3-0 chromic suture.  The scrotal wall was examined and hemostasis was adequate.  The testis was delivered back into the left hemiscrotum in its anatomic position.  1/4 inch Penrose drain was placed through a separate stab incision in the  dependent portion of the left hemiscrotum and secured with 0 nylon.  The skin was anesthetized with half percent plain Sensorcaine.  The dartos was closed with a running 3-0 Vicryl suture and the skin was closed with a running horizontal mattress suture of 3-0 chromic.  A dressing of bacitracin ointment, fluffs and scrotal support was applied.  After anesthetic reversal he was transported to PACU in stable condition.    Abbie Sons, M.D.

## 2018-03-01 NOTE — Anesthesia Post-op Follow-up Note (Signed)
Anesthesia QCDR form completed.        

## 2018-03-01 NOTE — Anesthesia Procedure Notes (Signed)
Procedure Name: LMA Insertion Date/Time: 03/01/2018 7:45 AM Performed by: Allean Found, CRNA Pre-anesthesia Checklist: Patient identified, Emergency Drugs available, Suction available, Patient being monitored and Timeout performed Patient Re-evaluated:Patient Re-evaluated prior to induction Oxygen Delivery Method: Circle system utilized Preoxygenation: Pre-oxygenation with 100% oxygen Induction Type: IV induction Ventilation: Mask ventilation without difficulty LMA: LMA inserted LMA Size: 5.0 Number of attempts: 1 Tube secured with: Tape Dental Injury: Teeth and Oropharynx as per pre-operative assessment

## 2018-03-02 ENCOUNTER — Other Ambulatory Visit: Payer: Self-pay | Admitting: Family Medicine

## 2018-03-02 DIAGNOSIS — M5441 Lumbago with sciatica, right side: Principal | ICD-10-CM

## 2018-03-02 DIAGNOSIS — G8929 Other chronic pain: Secondary | ICD-10-CM

## 2018-03-02 LAB — SURGICAL PATHOLOGY

## 2018-03-03 ENCOUNTER — Ambulatory Visit: Payer: Medicare Other

## 2018-03-03 DIAGNOSIS — N430 Encysted hydrocele: Secondary | ICD-10-CM

## 2018-03-03 NOTE — Progress Notes (Signed)
Pt present in office today for nurse visit. Sutures and drain removed with ease. Guaze were placed over incision and pt was instructed to f/u as scheduled. No complications noted during this visit.

## 2018-04-01 ENCOUNTER — Encounter: Payer: Self-pay | Admitting: Urology

## 2018-04-01 ENCOUNTER — Ambulatory Visit (INDEPENDENT_AMBULATORY_CARE_PROVIDER_SITE_OTHER): Payer: Medicare Other | Admitting: Urology

## 2018-04-01 VITALS — BP 148/76 | HR 62 | Ht 72.0 in | Wt 247.4 lb

## 2018-04-01 DIAGNOSIS — Z87438 Personal history of other diseases of male genital organs: Secondary | ICD-10-CM

## 2018-04-01 DIAGNOSIS — Z9889 Other specified postprocedural states: Secondary | ICD-10-CM

## 2018-04-01 NOTE — Progress Notes (Signed)
04/01/2018 3:35 PM   Jesus Dillon [birth year] [id number]  Referring provider: Arnetha Courser, MD [address] [place], [place] [address]  Chief Complaint  Patient presents with  . Post-op Follow-up    HPI: 77 year old male presents for postop follow-up.  He is status post left hydrocelectomy on 03/01/2018.  His scrotal drain was removed 2 days postop.  He has noted persistent moderate swelling but has no pain or discomfort.  He had no postoperative problems.  Pathology of the hydrocele sac showed benign tissue with inflammatory changes.   PMH: Past Medical History:  Diagnosis Date  . Arthritis   . GERD (gastroesophageal reflux disease)   . Gout   . HBP (high blood pressure)   . Heart murmur   . Hyperlipidemia   . Tremors of nervous system     Surgical History: Past Surgical History:  Procedure Laterality Date  . ANAL FISSURECTOMY    . HYDROCELE EXCISION Left 03/01/2018   Procedure: HYDROCELECTOMY ADULT;  Surgeon: Abbie Sons, MD;  Location: [facility] ORS;  Service: Urology;  Laterality: Left;    Home Medications:  Allergies as of 04/01/2018      Reactions   Penicillins Rash   Has patient had a PCN reaction causing immediate rash, facial/tongue/throat swelling, SOB or lightheadedness with hypotension: no Has patient had a PCN reaction causing severe rash involving mucus membranes or skin necrosis: no Has patient had a PCN reaction that required hospitalization: no Has patient had a PCN reaction occurring within the last 10 years: no If all of the above answers are "NO", then may proceed with Cephalosporin use.      Medication List        Accurate as of 04/01/18  3:35 PM. Always use your most recent med list.          allopurinol 100 MG tablet Commonly known as:  ZYLOPRIM Take 2 tablets (200 mg total) by mouth daily.   amLODipine 10 MG tablet Commonly known as:  NORVASC Take 1 tablet (10 mg total) by mouth daily.   aspirin EC 81 MG  tablet Take 1 tablet (81 mg total) by mouth daily. No NSAIDs for at least one hour   atorvastatin 20 MG tablet Commonly known as:  LIPITOR Take 1 tablet (20 mg total) by mouth at bedtime.   cholecalciferol 1000 units tablet Commonly known as:  VITAMIN D Take 1,000 Units by mouth daily.   diclofenac sodium 1 % Gel Commonly known as:  VOLTAREN Apply 4 g topically 4 (four) times daily.   gabapentin 300 MG capsule Commonly known as:  NEURONTIN TAKE 1 CAPSULE THREE TIMES A DAY   propranolol 10 MG tablet Commonly known as:  INDERAL Take 1 tablet (10 mg total) by mouth 3 (three) times daily.   telmisartan 80 MG tablet Commonly known as:  MICARDIS Take 1 tablet (80 mg total) by mouth daily. (this replaces losartan); for blood pressure       Allergies:  Allergies  Allergen Reactions  . Penicillins Rash    Has patient had a PCN reaction causing immediate rash, facial/tongue/throat swelling, SOB or lightheadedness with hypotension: no Has patient had a PCN reaction causing severe rash involving mucus membranes or skin necrosis: no Has patient had a PCN reaction that required hospitalization: no Has patient had a PCN reaction occurring within the last 10 years: no If all of the above answers are "NO", then may proceed with Cephalosporin use.     Family History:  Family History  Problem Relation Age of Onset  . Hypertension Mother   . Dementia Mother   . Angina Mother   . Diabetes Brother   . Hypertension Brother   . Blindness Maternal Grandmother     Social History:  reports that he has never smoked. He has never used smokeless tobacco. He reports that he does not drink alcohol or use drugs.  ROS: UROLOGY Frequent Urination?: No Hard to postpone urination?: No Burning/pain with urination?: No Get up at night to urinate?: No Leakage of urine?: No Urine stream starts and stops?: No Trouble starting stream?: No Do you have to strain to urinate?: No Blood in urine?:  No Urinary tract infection?: No Sexually transmitted disease?: No Injury to kidneys or bladder?: No Painful intercourse?: No Weak stream?: No Erection problems?: No Penile pain?: No  Gastrointestinal Nausea?: No Vomiting?: No Indigestion/heartburn?: No Diarrhea?: No Constipation?: No  Constitutional Fever: No Night sweats?: No Weight loss?: No Fatigue?: No  Skin Skin rash/lesions?: No Itching?: No  Eyes Blurred vision?: No Double vision?: No  Ears/Nose/Throat Sore throat?: No Sinus problems?: No  Hematologic/Lymphatic Swollen glands?: No Easy bruising?: No  Cardiovascular Leg swelling?: No Chest pain?: No  Respiratory Cough?: No Shortness of breath?: No  Endocrine Excessive thirst?: No  Musculoskeletal Back pain?: No Joint pain?: No  Neurological Headaches?: No Dizziness?: No  Psychologic Depression?: No Anxiety?: No  Physical Exam: BP (!) 148/76 (BP Location: Left Arm, Patient Position: Sitting, Cuff Size: Large)   Pulse 62   Ht 6' (1.829 m)   Wt 247 lb 6.4 oz (112.2 kg)   BMI 33.55 kg/m   Constitutional:  Alert and oriented, No acute distress. HEENT: [place] AT, moist mucus membranes.  Trachea midline, no masses. Cardiovascular: No clubbing, cyanosis, or edema. Respiratory: Normal respiratory effort, no increased work of breathing. GI: Abdomen is soft, nontender, nondistended, no abdominal masses GU: No CVA tenderness.  Penis without lesions.  Right testis descended normal.  There is moderate left hemiscrotal swelling.  There is no skin erythema or thickening.  The incision has healed. Lymph: No cervical or inguinal lymphadenopathy. Skin: No rashes, bruises or suspicious lesions. Neurologic: Grossly intact, no focal deficits, moving all 4 extremities. Psychiatric: Normal mood and affect.   Assessment & Plan:   Doing well status post left hydrocelectomy.  He has more swelling than I would like to see and this may be fluid density.  He is  asymptomatic.  I have asked him to observe the swelling for the next month and if it has not significantly decreased by that time to call back and will schedule a scrotal sonogram.  If he does continue to improve we will have him follow-up in 6 months.   Return in about 4 months (around 08/02/2018) for Recheck.  Abbie Sons, [place] [address], [place], [place] [address] [phone]

## 2018-04-21 ENCOUNTER — Telehealth: Payer: Self-pay

## 2018-04-21 NOTE — Telephone Encounter (Signed)
Overdue for AWV. Called pt to sched appt w/ [facility]. LVM requesting returned call. 

## 2018-04-25 ENCOUNTER — Ambulatory Visit: Payer: Medicare Other | Admitting: Family Medicine

## 2018-05-09 ENCOUNTER — Ambulatory Visit: Payer: Medicare Other | Admitting: Family Medicine

## 2018-05-26 ENCOUNTER — Other Ambulatory Visit: Payer: Self-pay | Admitting: Family Medicine

## 2018-06-06 ENCOUNTER — Ambulatory Visit: Payer: Medicare Other | Admitting: Family Medicine

## 2018-06-06 ENCOUNTER — Encounter: Payer: Self-pay | Admitting: Family Medicine

## 2018-06-06 VITALS — BP 136/78 | HR 60 | Temp 98.2°F | Ht 72.0 in | Wt 244.9 lb

## 2018-06-06 DIAGNOSIS — G8929 Other chronic pain: Secondary | ICD-10-CM

## 2018-06-06 DIAGNOSIS — R600 Localized edema: Secondary | ICD-10-CM | POA: Diagnosis not present

## 2018-06-06 DIAGNOSIS — I10 Essential (primary) hypertension: Secondary | ICD-10-CM | POA: Diagnosis not present

## 2018-06-06 DIAGNOSIS — E559 Vitamin D deficiency, unspecified: Secondary | ICD-10-CM

## 2018-06-06 DIAGNOSIS — M25511 Pain in right shoulder: Secondary | ICD-10-CM | POA: Diagnosis not present

## 2018-06-06 DIAGNOSIS — Z1211 Encounter for screening for malignant neoplasm of colon: Secondary | ICD-10-CM

## 2018-06-06 DIAGNOSIS — R739 Hyperglycemia, unspecified: Secondary | ICD-10-CM

## 2018-06-06 DIAGNOSIS — Z5181 Encounter for therapeutic drug level monitoring: Secondary | ICD-10-CM | POA: Insufficient documentation

## 2018-06-06 DIAGNOSIS — M1A4710 Other secondary chronic gout, right ankle and foot, without tophus (tophi): Secondary | ICD-10-CM

## 2018-06-06 DIAGNOSIS — E7800 Pure hypercholesterolemia, unspecified: Secondary | ICD-10-CM | POA: Diagnosis not present

## 2018-06-06 DIAGNOSIS — Z23 Encounter for immunization: Secondary | ICD-10-CM | POA: Diagnosis not present

## 2018-06-06 NOTE — Progress Notes (Signed)
BP 136/78   Pulse 60   Temp 98.2 F (36.8 C) (Oral)   Ht 6' (1.829 m)   Wt 244 lb 14.4 oz (111.1 kg)   SpO2 99%   BMI 33.21 kg/m    Subjective:    [name], male    DOB: Oct 06, 1940, 77 y.o.   MRN: [id number]  HPI: Jesus Dillon is a 77 y.o. male  Chief Complaint  Patient presents with  . Follow-up  . Edema    bilateral ankles with sorness  . Shoulder Pain    right onset 1.5 months  . Hydrocele    had surgery with stoioff and still there  . cologuard    please discuss this verses colonoscopy patient states had fissure in past?    HPI  Here for f/u  Had hydrocoele repair, Dr. Bernardo Heater on June 11th; went back to see him for f/u; still not as good as expected; going back in November for recheck; he would like this checked; he thinks next time they would just use [place] and drain off some fluid; no pain, no blood in the urine  Years ago, he had a tear in the rectum; it was repaired; he used to have little tracks of blood; none recently; Dr. Manuella Ghazi ordered the Cologuard  He is having right shoulder pain; he threw a lot in high school; when he rolls over he has pain; he remembers it snapped in high school; going on for years, but really started to hurt over a month ago; turned over in bed one night and really felt it; can have trouble reaching behind him; trouble raising arm up to the side; he is Right-handed  He is having swelling in both legs; started 2 months ago; no PND, no orthopnea  High cholesterol; on statin;  Lab Results  Component Value Date   CHOL 137 06/06/2018   HDL 44 06/06/2018   LDLCALC 78 06/06/2018   TRIG 72 06/06/2018   CHOLHDL 3.1 06/06/2018   HTN; on amlo; on DASH guidelines  Obesity; has been trying; drinks water, but "I need to do more"  High sugar; no blurred vision; no dry mouth; brother has diabetes; mother did not  Depression screen [facility] 2/9 06/09/2018 06/06/2018 01/14/2018 12/20/2017 10/21/2017  Decreased Interest 0 0 0 0 0  Down,  Depressed, Hopeless 0 0 0 0 0  PHQ - 2 Score 0 0 0 0 0  Altered sleeping 0 1 - - -  Tired, decreased energy 0 1 - - -  Change in appetite 0 0 - - -  Feeling bad or failure about yourself  0 0 - - -  Trouble concentrating 0 0 - - -  Moving slowly or fidgety/restless 0 0 - - -  Suicidal thoughts 0 0 - - -  PHQ-9 Score 0 2 - - -  Difficult doing work/chores Not difficult at all Not difficult at all - - -    Relevant past medical, surgical, family and social history reviewed Past Medical History:  Diagnosis Date  . Arthritis   . GERD (gastroesophageal reflux disease)   . Gout   . HBP (high blood pressure)   . Heart murmur   . Hyperlipidemia   . Tremors of nervous system    Past Surgical History:  Procedure Laterality Date  . ANAL FISSURECTOMY    . HYDROCELE EXCISION Left 03/01/2018   Procedure: HYDROCELECTOMY ADULT;  Surgeon: Abbie Sons, MD;  Location: [facility] ORS;  Service:  Urology;  Laterality: Left;   Family History  Problem Relation Age of Onset  . Hypertension Mother   . Dementia Mother   . Angina Mother   . Diabetes Brother   . Hypertension Brother   . Blindness Maternal Grandmother    Social History   Tobacco Use  . Smoking status: Never Smoker  . Smokeless tobacco: Never Used  . Tobacco comment: smoking cessation materials not required  Substance Use Topics  . Alcohol use: No    Alcohol/week: 0.0 standard drinks  . Drug use: No    Interim medical history since last visit reviewed. Allergies and medications reviewed  Review of Systems Per HPI unless specifically indicated above     Objective:    BP 136/78   Pulse 60   Temp 98.2 F (36.8 C) (Oral)   Ht 6' (1.829 m)   Wt 244 lb 14.4 oz (111.1 kg)   SpO2 99%   BMI 33.21 kg/m   Wt Readings from Last 3 Encounters:  06/09/18 245 lb 1.6 oz (111.2 kg)  06/06/18 244 lb 14.4 oz (111.1 kg)  04/01/18 247 lb 6.4 oz (112.2 kg)    Physical Exam  Constitutional: He appears well-developed and  well-nourished. No distress.  HENT:  Head: Normocephalic and atraumatic.  Eyes: EOM are normal. No scleral icterus.  Neck: No thyromegaly present.  Cardiovascular: Normal rate and regular rhythm.  Pulmonary/Chest: Effort normal and breath sounds normal.  Abdominal: Soft. Bowel sounds are normal. He exhibits no distension.  Genitourinary: Left testis shows swelling.  Genitourinary Comments: Significant swelling of the LEFT scrotal sac; no erythema  Musculoskeletal: He exhibits edema (lower extremity).       Right shoulder: He exhibits decreased range of motion.  Neurological: Coordination normal.  Skin: Skin is warm and dry. No pallor.  Psychiatric: He has a normal mood and affect. His behavior is normal. Judgment and thought content normal.      Assessment & Plan:   Problem List Items Addressed This Visit      Cardiovascular and Mediastinum   HBP (high blood pressure) (Chronic)    Check echo today, r/o LVH, try DASH guidelines; limit salt; work on weight loss        Other   Chronic gout   Relevant Orders   Uric acid   Medication monitoring encounter    Check liiver and kidney      Relevant Orders   COMPLETE METABOLIC PANEL WITH GFR (Completed)   Hyperlipidemia (Chronic)    Limit saturated fats check lipids today      Relevant Orders   Lipid panel (Completed)   Hyperglycemia    Check A1c; work on weight loss      Relevant Orders   Hemoglobin A1c (Completed)    Other Visit Diagnoses    Chronic right shoulder pain    -  Primary   Relevant Orders   Ambulatory referral to Orthopedic Surgery   Bilateral leg edema       may be secondary to CCB, but will check BNP and echocardiogram to r/o CHF   Relevant Orders   ECHOCARDIOGRAM COMPLETE   B Nat Peptide (Completed)   Vitamin D deficiency       Relevant Orders   VITAMIN D 25 Hydroxy (Vit-D Deficiency, Fractures)   Colon cancer screening       Relevant Orders   Ambulatory referral to Gastroenterology   Need for  influenza vaccination       Relevant Orders   Flu  vaccine HIGH DOSE PF (Fluzone High dose) (Completed)       Follow up plan: Return in about 4 weeks (around 07/04/2018) for follow-up visit with Dr. Sanda Klein.  An after-visit summary was printed and given to the patient at [place].  Please see the patient instructions which may contain other information and recommendations beyond what is mentioned above in the assessment and plan.  No orders of the defined types were placed in this encounter.   Orders Placed This Encounter  Procedures  . Flu vaccine HIGH DOSE PF (Fluzone High dose)  . B Nat Peptide  . Lipid panel  . Hemoglobin A1c  . COMPLETE METABOLIC PANEL WITH GFR  . VITAMIN D 25 Hydroxy (Vit-D Deficiency, Fractures)  . Uric acid  . VITAMIN D 25 Hydroxy (Vit-D Deficiency, Fractures)  . Uric acid  . Ambulatory referral to Gastroenterology  . Ambulatory referral to Orthopedic Surgery  . ECHOCARDIOGRAM COMPLETE

## 2018-06-06 NOTE — Patient Instructions (Addendum)
You should hear soon about the referrals to orthopaedics and gastroenterology Please call [phone] to schedule your echocardiogram Please wait 2-3 days after the order has been placed to call and get your echocardiogram scheduled  Check out the information at [email or web address] entitled "Nutrition for Weight Loss: What You Need to Know about Fad Diets" Try to lose between 1-2 pounds per week by taking in fewer calories and burning off more calories You can succeed by limiting portions, limiting foods dense in calories and fat, becoming more active, and drinking 8 glasses of water a day (64 ounces) Don't skip meals, especially breakfast, as skipping meals may alter your metabolism Do not use over-the-counter weight loss pills or gimmicks that claim rapid weight loss A healthy BMI (or body mass index) is between 18.5 and 24.9 You can calculate your ideal BMI at the [place] website ClubMonetize.fr  Try compression stockings on the legs, 18 mmHg or 20-30 mmHg to help with fluid overload Avoid salt as much as possible  Obesity, Adult Obesity is the condition of having too much total body fat. Being overweight or obese means that your weight is greater than what is considered healthy for your body size. Obesity is determined by a measurement called BMI. BMI is an estimate of body fat and is calculated from height and weight. For adults, a BMI of 30 or higher is considered obese. Obesity can eventually lead to other health concerns and major illnesses, including:  Stroke.  Coronary artery disease (CAD).  Type 2 diabetes.  Some types of cancer, including cancers of the colon, breast, uterus, and gallbladder.  Osteoarthritis.  High blood pressure (hypertension).  High cholesterol.  Sleep apnea.  Gallbladder stones.  Infertility problems.  What are the causes? The main cause of obesity is taking in (consuming) more calories than your  body uses for energy. Other factors that contribute to this condition may include:  Being born with genes that make you more likely to become obese.  Having a medical condition that causes obesity. These conditions include: ? Hypothyroidism. ? Polycystic ovarian syndrome (PCOS). ? Binge-eating disorder. ? Cushing syndrome.  Taking certain medicines, such as steroids, antidepressants, and seizure medicines.  Not being physically active (sedentary lifestyle).  Living where there are limited places to exercise safely or buy healthy foods.  Not getting enough sleep.  What increases the risk? The following factors may increase your risk of this condition:  Having a family history of obesity.  Being a woman of African-American descent.  Being a man of Hispanic descent.  What are the signs or symptoms? Having excessive body fat is the main symptom of this condition. How is this diagnosed? This condition may be diagnosed based on:  Your symptoms.  Your medical history.  A physical exam. Your health care provider may measure: ? Your BMI. If you are an adult with a BMI between 25 and less than 30, you are considered overweight. If you are an adult with a BMI of 30 or higher, you are considered obese. ? The distances around your hips and your waist (circumferences). These may be compared to each other to help diagnose your condition. ? Your skinfold thickness. Your health care provider may gently pinch a fold of your skin and measure it.  How is this treated? Treatment for this condition often includes changing your lifestyle. Treatment may include some or all of the following:  Dietary changes. Work with your health care provider and a dietitian to set a weight-loss goal  that is healthy and reasonable for you. Dietary changes may include eating: ? Smaller portions. A portion size is the amount of a particular food that is healthy for you to eat at one time. This varies from person  to person. ? Low-calorie or low-fat options. ? More whole grains, fruits, and vegetables.  Regular physical activity. This may include aerobic activity (cardio) and strength training.  Medicine to help you lose weight. Your health care provider may prescribe medicine if you are unable to lose 1 pound a week after 6 weeks of eating more healthily and doing more physical activity.  Surgery. Surgical options may include gastric banding and gastric bypass. Surgery may be done if: ? Other treatments have not helped to improve your condition. ? You have a BMI of 40 or higher. ? You have life-threatening health problems related to obesity.  Follow these instructions at home:  Eating and drinking   Follow recommendations from your health care provider about what you eat and drink. Your health care provider may advise you to: ? Limit fast foods, sweets, and processed snack foods. ? Choose low-fat options, such as low-fat milk instead of whole milk. ? Eat 5 or more servings of fruits or vegetables every day. ? Eat at home more often. This gives you more control over what you eat. ? Choose healthy foods when you eat out. ? Learn what a healthy portion size is. ? Keep low-fat snacks on hand. ? Avoid sugary drinks, such as soda, fruit juice, iced tea sweetened with sugar, and flavored milk. ? Eat a healthy breakfast.  Drink enough water to keep your urine clear or pale yellow.  Do not go without eating for long periods of time (do not fast) or follow a fad diet. Fasting and fad diets can be unhealthy and even dangerous. Physical Activity  Exercise regularly, as told by your health care provider. Ask your health care provider what types of exercise are safe for you and how often you should exercise.  Warm up and stretch before being active.  Cool down and stretch after being active.  Rest between periods of activity. Lifestyle  Limit the time that you spend in front of your TV, computer,  or video game system.  Find ways to reward yourself that do not involve food.  Limit alcohol intake to no more than 1 drink a day for nonpregnant women and 2 drinks a day for men. One drink equals 12 oz of beer, 5 oz of wine, or 1 oz of hard liquor. General instructions  Keep a weight loss journal to keep track of the food you eat and how much you exercise you get.  Take over-the-counter and prescription medicines only as told by your health care provider.  Take vitamins and supplements only as told by your health care provider.  Consider joining a support group. Your health care provider may be able to recommend a support group.  Keep all follow-up visits as told by your health care provider. This is important. Contact a health care provider if:  You are unable to meet your weight loss goal after 6 weeks of dietary and lifestyle changes. This information is not intended to replace advice given to you by your health care provider. Make sure you discuss any questions you have with your health care provider. Document Released: 10/15/2004 Document Revised: 02/10/2016 Document Reviewed: 06/26/2015 Elsevier Interactive Patient Education  2018 [place].  Preventing Unhealthy [employer], Adult Staying at a healthy weight is  important. When fat builds up in your body, you may become overweight or obese. These conditions put you at greater risk for developing certain health problems, such as heart disease, diabetes, sleeping problems, joint problems, and some cancers. Unhealthy weight gain is often the result of making unhealthy choices in what you eat. It is also a result of not getting enough exercise. You can make changes to your lifestyle to prevent obesity and stay as healthy as possible. What nutrition changes can be made? To maintain a healthy weight and prevent obesity:  Eat only as much as your body needs. To do this: ? Pay attention to signs that you are hungry or full. Stop  eating as soon as you feel full. ? If you feel hungry, try drinking water first. Drink enough water so your urine is clear or pale yellow. ? Eat smaller portions. ? Look at serving sizes on food labels. Most foods contain more than one serving per container. ? Eat the recommended amount of calories for your gender and activity level. While most active people should eat around 2,000 calories per day, if you are trying to lose weight or are not very active, you main need to eat less calories. Talk to your health care provider or dietitian about how many calories you should eat each day.  Choose healthy foods, such as: ? Fruits and vegetables. Try to fill at least half of your plate at each meal with fruits and vegetables. ? Whole grains, such as whole wheat bread, brown rice, and quinoa. ? Lean meats, such as chicken or fish. ? Other healthy proteins, such as beans, eggs, or tofu. ? Healthy fats, such as nuts, seeds, fatty fish, and olive oil. ? Low-fat or fat-free dairy.  Check food labels and avoid food and drinks that: ? Are high in calories. ? Have added sugar. ? Are high in sodium. ? Have saturated fats or trans fats.  Limit how much you eat of the following foods: ? Prepackaged meals. ? Fast food. ? Fried foods. ? Processed meat, such as bacon, sausage, and deli meats. ? Fatty cuts of red meat and poultry with skin.  Cook foods in healthier ways, such as by baking, broiling, or grilling.  When grocery shopping, try to shop around the outside of the store. This helps you buy mostly fresh foods and avoid canned and prepackaged foods.  What lifestyle changes can be made?  Exercise at least 30 minutes 5 or more days each week. Exercising includes brisk walking, yard work, biking, running, swimming, and team sports like basketball and soccer. Ask your health care provider which exercises are safe for you.  Do not use any products that contain nicotine or tobacco, such as cigarettes  and e-cigarettes. If you need help quitting, ask your health care provider.  Limit alcohol intake to no more than 1 drink a day for nonpregnant women and 2 drinks a day for men. One drink equals 12 oz of beer, 5 oz of wine, or 1 oz of hard liquor.  Try to get 7-9 hours of sleep each night. What other changes can be made?  Keep a food and activity journal to keep track of: ? What you ate and how many calories you had. Remember to count sauces, dressings, and side dishes. ? Whether you were active, and what exercises you did. ? Your calorie, weight, and activity goals.  Check your weight regularly. Track any changes. If you notice you have gained weight, make changes to  your diet or activity routine.  Avoid taking weight-loss medicines or supplements. Talk to your health care provider before starting any new medicine or supplement.  Talk to your health care provider before trying any new diet or exercise plan. Why are these changes important? Eating healthy, staying active, and having healthy habits not only help prevent obesity, they also:  Help you to manage stress and emotions.  Help you to connect with friends and family.  Improve your self-esteem.  Improve your sleep.  Prevent long-term health problems.  What can happen if changes are not made? Being obese or overweight can cause you to develop joint or bone problems, which can make it hard for you to stay active or do activities you enjoy. Being obese or overweight also puts stress on your heart and lungs and can lead to health problems like diabetes, heart disease, and some cancers. Where to find more information: Talk with your health care provider or a dietitian about healthy eating and healthy lifestyle choices. You may also find other information through these resources:  [place] Department of Agriculture MyPlate: FormerBoss.no  American Heart Association: www.heart.org  Centers for Disease Control and  Prevention: http://www.wolf.info/  Summary  Staying at a healthy weight is important. It helps prevent certain diseases and health problems, such as heart disease, diabetes, joint problems, sleep disorders, and some cancers.  Being obese or overweight can cause you to develop joint or bone problems, which can make it hard for you to stay active or do activities you enjoy.  You can prevent unhealthy weight gain by eating a healthy diet, exercising regularly, not smoking, limiting alcohol, and getting enough sleep.  Talk with your health care provider or a dietitian for guidance about healthy eating and healthy lifestyle choices. This information is not intended to replace advice given to you by your health care provider. Make sure you discuss any questions you have with your health care provider. Document Released: 09/08/2016 Document Revised: 10/14/2016 Document Reviewed: 10/14/2016 Elsevier Interactive Patient Education  [employer].

## 2018-06-06 NOTE — Assessment & Plan Note (Signed)
Check echo today, r/o LVH, try DASH guidelines; limit salt; work on weight loss

## 2018-06-06 NOTE — Assessment & Plan Note (Signed)
Check A1c; work on weight loss

## 2018-06-06 NOTE — Assessment & Plan Note (Signed)
Check liiver and kidney

## 2018-06-06 NOTE — Assessment & Plan Note (Signed)
Limit saturated fats; check lipids today 

## 2018-06-07 LAB — LIPID PANEL
CHOL/HDL RATIO: 3.1 (calc) (ref <5.0)
Cholesterol: 137 mg/dL (ref <200)
HDL: 44 mg/dL (ref >40)
LDL Cholesterol (Calc): 78 mg/dL (calc)
NON-HDL CHOLESTEROL (CALC): 93 mg/dL (ref <130)
Triglycerides: 72 mg/dL (ref <150)

## 2018-06-07 LAB — COMPLETE METABOLIC PANEL WITH GFR
AG Ratio: 1.3 (calc) (ref 1.0–2.5)
ALKALINE PHOSPHATASE (APISO): 61 U/L (ref 40–115)
ALT: 29 U/L (ref 9–46)
AST: 28 U/L (ref 10–35)
Albumin: 4.7 g/dL (ref 3.6–5.1)
BUN: 12 mg/dL (ref 7–25)
CHLORIDE: 104 mmol/L (ref 98–110)
CO2: 29 mmol/L (ref 20–32)
Calcium: 9.5 mg/dL (ref 8.6–10.3)
Creat: 1.14 mg/dL (ref 0.70–1.18)
GFR, Est African American: 73 mL/min/{1.73_m2} (ref >=60)
GFR, Est Non African American: 63 mL/min/{1.73_m2} (ref >=60)
GLUCOSE: 89 mg/dL (ref 65–99)
Globulin: 3.6 g/dL (calc) (ref 1.9–3.7)
POTASSIUM: 4.1 mmol/L (ref 3.5–5.3)
Sodium: 138 mmol/L (ref 135–146)
TOTAL PROTEIN: 8.3 g/dL — AB (ref 6.1–8.1)
Total Bilirubin: 0.6 mg/dL (ref 0.2–1.2)

## 2018-06-07 LAB — VITAMIN D 25 HYDROXY (VIT D DEFICIENCY, FRACTURES): Vit D, 25-Hydroxy: 30 ng/mL (ref 30–100)

## 2018-06-07 LAB — URIC ACID: Uric Acid, Serum: 5 mg/dL (ref 4.0–8.0)

## 2018-06-07 LAB — BRAIN NATRIURETIC PEPTIDE: Brain Natriuretic Peptide: 27 pg/mL (ref <100)

## 2018-06-08 ENCOUNTER — Other Ambulatory Visit: Payer: Self-pay | Admitting: Family Medicine

## 2018-06-08 MED ORDER — ATORVASTATIN CALCIUM 40 MG PO TABS: 40.0000 mg | ORAL_TABLET | Freq: Every day | ORAL | 1 refills | Status: DC

## 2018-06-08 NOTE — Progress Notes (Signed)
Increase statin

## 2018-06-09 ENCOUNTER — Ambulatory Visit (INDEPENDENT_AMBULATORY_CARE_PROVIDER_SITE_OTHER): Payer: Medicare Other

## 2018-06-09 ENCOUNTER — Telehealth: Payer: Self-pay

## 2018-06-09 VITALS — BP 120/60 | HR 56 | Temp 97.8°F | Resp 12 | Ht 72.0 in | Wt 245.1 lb

## 2018-06-09 DIAGNOSIS — Z0000 Encounter for general adult medical examination without abnormal findings: Secondary | ICD-10-CM | POA: Diagnosis not present

## 2018-06-09 DIAGNOSIS — E7800 Pure hypercholesterolemia, unspecified: Secondary | ICD-10-CM

## 2018-06-09 NOTE — Telephone Encounter (Signed)
-----   Message from Arnetha Courser, MD sent at 06/08/2018  3:59 PM EDT ----- Please let the patient know that his BNP test for congestive heart failure came back normal. We still want to proceed with the echocardiogram, though.  His LDL is 78, which is higher than ideal (less than 70). Using a risk calculator, he has a 22.9% chance of having a heart attack or major atherosclerotic event in the next 10 years. I'd like to increase his atorvastatin to 40 mg and encourage him to work on weight, healthy eating, and activity. Let's recheck lipids in 6 weeks (please ORDER). Vitamin D is back to normal, but at the very bottom of the normal range. Encourage 1,000 iu of vitamin D3 once a day.  The 10-year ASCVD risk score Mikey Bussing DC Brooke Bonito., et al., 2013) is: 22.9%   Values used to calculate the score:     Age: 77 years     Sex: Male     Is Non-Hispanic African American: Yes     Diabetic: No     Tobacco smoker: No     Systolic Blood Pressure: 349 mmHg     Is BP treated: Yes     HDL Cholesterol: 44 mg/dL     Total Cholesterol: 137 mg/dL

## 2018-06-09 NOTE — Progress Notes (Signed)
Subjective:   Jesus Dillon is a 77 y.o. male who presents for Medicare Annual/Subsequent preventive examination.  Review of Systems:  N/A Cardiac Risk Factors include: advanced age (>40men, >48 women);male gender;hypertension;dyslipidemia;obesity (BMI >30kg/m2);sedentary lifestyle     Objective:    Vitals: BP 120/60 (BP Location: Left Arm, Patient Position: Sitting, Cuff Size: Large)   Pulse (!) 56   Temp 97.8 F (36.6 C) (Oral)   Resp 12   Ht 6' (1.829 m)   Wt 245 lb 1.6 oz (111.2 kg)   SpO2 96%   BMI 33.24 kg/m   Body mass index is 33.24 kg/m.  Advanced Directives 06/09/2018 03/01/2018 02/24/2018 06/18/2017 02/16/2017 12/07/2016 08/27/2016  Does Patient Have a Medical Advance Directive? Yes Yes Yes Yes No Yes Yes  Type of Paramedic of [place];Living will [place];Living will [place];Living will [place];Living will - Living will Living will  Does patient want to make changes to medical advance directive? - No - Patient declined No - Patient declined - - - -  Copy of [place] in Chart? No - copy requested Yes No - copy requested Yes - - -  Would patient like information on creating a medical advance directive? - - - - - - -    Tobacco Social History   Tobacco Use  Smoking Status Never Smoker  Smokeless Tobacco Never Used  Tobacco Comment   smoking cessation materials not required     Counseling given: No Comment: smoking cessation materials not required  Clinical Intake:  Pre-visit preparation completed: Yes  Pain : No/denies pain Pain Score: 0-No pain   BMI - recorded: 33.24 Nutritional Status: BMI > 30  Obese Nutritional Risks: None Diabetes: No  How often do you need to have someone help you when you read instructions, pamphlets, or other written materials from your doctor or pharmacy?: 1 - Never  Interpreter Needed?: No  Information entered by ::  AEversole, LPN  Past Medical History:  Diagnosis Date  . Arthritis   . GERD (gastroesophageal reflux disease)   . Gout   . HBP (high blood pressure)   . Heart murmur   . Hyperlipidemia   . Tremors of nervous system    Past Surgical History:  Procedure Laterality Date  . ANAL FISSURECTOMY    . HYDROCELE EXCISION Left 03/01/2018   Procedure: HYDROCELECTOMY ADULT;  Surgeon: Abbie Sons, MD;  Location: [facility] ORS;  Service: Urology;  Laterality: Left;   Family History  Problem Relation Age of Onset  . Hypertension Mother   . Dementia Mother   . Angina Mother   . Diabetes Brother   . Hypertension Brother   . Blindness Maternal Grandmother    Social History   Socioeconomic History  . Marital status: Married    Spouse name: Levander Campion  . Number of children: 1  . Years of education: Not on file  . Highest education level: Master's degree (e.g., MA, MS, MEng, MEd, MSW, MBA)  Occupational History  . Occupation: Retired  Scientific laboratory technician  . Financial resource strain: Not hard at all  . Food insecurity:    Worry: Never true    Inability: Never true  . Transportation needs:    Medical: No    Non-medical: No  Tobacco Use  . Smoking status: Never Smoker  . Smokeless tobacco: Never Used  . Tobacco comment: smoking cessation materials not required  Substance and Sexual Activity  .  Alcohol use: No    Alcohol/week: 0.0 standard drinks  . Drug use: No  . Sexual activity: Yes    Partners: Female  Lifestyle  . Physical activity:    Days per week: 0 days    Minutes per session: 0 min  . Stress: Not at all  Relationships  . Social connections:    Talks on phone: Patient refused    Gets together: Patient refused    Attends religious service: Patient refused    Active member of club or organization: Patient refused    Attends meetings of clubs or organizations: Patient refused    Relationship status: Married  Other Topics Concern  . Not on file  Social History Narrative  . Not  on file    Outpatient Encounter Medications as of 06/09/2018  Medication Sig  . allopurinol (ZYLOPRIM) 100 MG tablet Take 2 tablets (200 mg total) by mouth daily.  Marland Kitchen amLODipine (NORVASC) 10 MG tablet Take 1 tablet (10 mg total) by mouth daily.  Marland Kitchen aspirin EC 81 MG tablet Take 1 tablet (81 mg total) by mouth daily. No NSAIDs for at least one hour  . atorvastatin (LIPITOR) 40 MG tablet Take 1 tablet (40 mg total) by mouth at bedtime.  . cholecalciferol (VITAMIN D) 1000 units tablet Take 1,000 Units by mouth daily.  Marland Kitchen gabapentin (NEURONTIN) 300 MG capsule TAKE 1 CAPSULE THREE TIMES A DAY  . propranolol (INDERAL) 10 MG tablet Take 1 tablet (10 mg total) by mouth 3 (three) times daily.  Marland Kitchen telmisartan (MICARDIS) 80 MG tablet Take 1 tablet (80 mg total) by mouth daily. (this replaces losartan); for blood pressure  . diclofenac sodium (VOLTAREN) 1 % GEL Apply 4 g topically 4 (four) times daily. (Patient not taking: Reported on 06/09/2018)   No facility-administered encounter medications on file as of 06/09/2018.     Activities of Daily Living In your present state of health, do you have any difficulty performing the following activities: 06/09/2018 06/06/2018  Hearing? N N  Comment denies hearing aids -  Vision? N N  Comment wears eyeglasses -  Difficulty concentrating or making decisions? N N  Walking or climbing stairs? N N  Dressing or bathing? N N  Doing errands, shopping? N N  Preparing Food and eating ? N -  Comment denies dentures -  Using the Toilet? N -  In the past six months, have you accidently leaked urine? Y -  Comment urgency -  Do you have problems with loss of bowel control? N -  Managing your Medications? N -  Managing your Finances? N -  Housekeeping or managing your Housekeeping? N -  Some recent data might be hidden    Patient Care Team: Lada, Satira Anis, MD as PCP - General (Family Medicine) Rockey Situ, Kathlene November, MD as Consulting Physician (Cardiology) Abbie Sons,  MD as Consulting Physician (Urology) Thornton Park, MD as Consulting Physician (Orthopedic Surgery)   Assessment:   This is a routine wellness examination for Jesus Dillon.  Exercise Activities and Dietary recommendations Current Exercise Habits: The patient does not participate in regular exercise at present, Exercise limited by: None identified  Goals    . DIET - REDUCE SODIUM INTAKE     Recommend to begin DASH diet (Diet attached to AVS).       Fall Risk Fall Risk  06/09/2018 06/06/2018 01/14/2018 12/20/2017 10/21/2017  Falls in the past year? No No No No No  Risk for fall due to : Impaired vision - - - -  Risk for fall due to: Comment wears eyeglasses - - - -   FALL RISK PREVENTION PERTAINING TO HOME: Is your home free of loose throw rugs in walkways, pet beds, electrical cords, etc? Yes  Is there adequate lighting in your home to reduce risk of falls? Yes  Are there stairs in or around your home WITH handrails? Yes   ASSISTIVE DEVICES UTILIZED TO PREVENT FALLS: Do you have a life alert? No  Use of a cane, walker or w/c? No  Grab bars in the bathroom? Yes  Shower chair or a place to sit while bathing? Yes  An elevated toilet seat or a handicapped toilet? Yes   Timed Get Up and Go Performed: Yes. Pt ambulated 10 feet within 28 sec. Gait slow, steady and without the use of an assistive device. Fall risk prevention has been discussed. No intervention required at this time.  DME Order and Community Resource Referral:  Does the patient want an order for a shower chair or an elevated toilet seat?  N/A Does the patient want a Data processing manager Referral sent to the Care Guide for a life alert or installation of grab bars in the shower?  No   Depression Screen PHQ 2/9 Scores 06/09/2018 06/06/2018 01/14/2018 12/20/2017  PHQ - 2 Score 0 0 0 0  PHQ- 9 Score 0 2 - -    Cognitive Function     6CIT Screen 06/09/2018  What Year? 0 points  What month? 0 points  What time? 0 points  Count  back from 20 0 points  Months in reverse 4 points  Repeat phrase 0 points  Total Score 4    Immunization History  Administered Date(s) Administered  . Influenza, High Dose Seasonal PF 06/18/2017, 06/06/2018  . Pneumococcal Conjugate-13 12/05/2013  . Pneumococcal Polysaccharide-23 04/11/2012  . Tdap 04/11/2012    Qualifies for Shingles Vaccine? Yes  Due for Shingrix. Education has been provided regarding the importance of this vaccine. Pt has been advised to call insurance company to determine out of pocket expense. Advised may also receive vaccine at local pharmacy or Health Dept. Verbalized acceptance and understanding.  Screening Tests Health Maintenance  Topic Date Due  . COLONOSCOPY  11/26/1992  . TETANUS/TDAP  04/11/2022  . INFLUENZA VACCINE  Completed  . PNA vac Low Risk Adult  Completed   Cancer Screenings: Colorectal Screening: Not yet completed. Ordered by Dr. Sanda Klein on 06/06/18. Pt still awaiting call. Advised to call our office in 1-2 weks if he has not heard anything about his appt. Lung Cancer Screening: (Low Dose CT Chest recommended if Age 39-80 years, 30 pack-year currently smoking OR have quit w/in 15years.) Does not qualify.   Additional Screening: Hepatitis C Screening: Does not qualify  Vision and Dental Exams: Vision Screening: Recommended annual ophthalmology exams for early detection of glaucoma and other disorders of the eye. Is the patient up to date with their annual eye exam?  Yes  Who is the provider or what is the name of the office in which the pt attends annual screenings? [facility] Recommended annual dental exams for proper oral hygiene    Plan:  I have personally reviewed and addressed the Medicare Annual Wellness questionnaire and have noted the following in the patient's chart:  A. Medical and social history B. Use of alcohol, tobacco or illicit drugs  C. Current medications and supplements D. Functional ability and status E.    Nutritional status F.  Physical activity G. Advance directives H.  List of other physicians I.  Hospitalizations, surgeries, and ER visits in previous 12 months J.  [place] such as hearing and vision if needed, cognitive and depression L. Referrals and appointments  In addition, I have reviewed and discussed with patient certain preventive protocols, quality metrics, and best practice recommendations. A written personalized care plan for preventive services as well as general preventive health recommendations were provided to patient.  See attached scanned questionnaire for additional information.   Signed,  Aleatha Borer, LPN Nurse Health Advisor

## 2018-06-09 NOTE — Patient Instructions (Signed)
Jesus Dillon , Thank you for taking time to come for your Medicare Wellness Visit. I appreciate your ongoing commitment to your health goals. Please review the following plan we discussed and let me know if I can assist you in the future.   Screening recommendations/referrals: Colorectal Screening: You will receive a call from our office regarding your appointment  Vision and Dental Exams: Recommended annual ophthalmology exams for early detection of glaucoma and other disorders of the eye Recommended annual dental exams for proper oral hygiene  Vaccinations: Influenza vaccine: Up to date Pneumococcal vaccine: Up to date Tdap vaccine: Up to date Shingles vaccine: Please call your insurance company to determine your out of pocket expense for the Shingrix vaccine. You may receive this vaccine at your local pharmacy.  Advanced directives: Please bring a copy of your POA (Power of Attorney) and/or Living Will to your next appointment.  Goals: Recommend to begin DASH diet as directed below  Next appointment: Please schedule your Annual Wellness Visit with your Nurse Health Advisor in one year.  Preventive Care 37 Years and Older, Male Preventive care refers to lifestyle choices and visits with your health care provider that can promote health and wellness. What does preventive care include?  A yearly physical exam. This is also called an annual well check.  Dental exams once or twice a year.  Routine eye exams. Ask your health care provider how often you should have your eyes checked.  Personal lifestyle choices, including:  Daily care of your teeth and gums.  Regular physical activity.  Eating a healthy diet.  Avoiding tobacco and drug use.  Limiting alcohol use.  Practicing safe sex.  Taking low doses of aspirin every day.  Taking vitamin and mineral supplements as recommended by your health care provider. What happens during an annual well check? The services and  screenings done by your health care provider during your annual well check will depend on your age, overall health, lifestyle risk factors, and family history of disease. Counseling  Your health care provider may ask you questions about your:  Alcohol use.  Tobacco use.  Drug use.  Emotional well-being.  Home and relationship well-being.  Sexual activity.  Eating habits.  History of falls.  Memory and ability to understand (cognition).  Work and work Statistician. Screening  You may have the following tests or measurements:  Height, weight, and BMI.  Blood pressure.  Lipid and cholesterol levels. These may be checked every 5 years, or more frequently if you are over 72 years old.  Skin check.  Lung cancer screening. You may have this screening every year starting at age 35 if you have a 30-pack-year history of smoking and currently smoke or have quit within the past 15 years.  Fecal occult blood test (FOBT) of the stool. You may have this test every year starting at age 61.  Flexible sigmoidoscopy or colonoscopy. You may have a sigmoidoscopy every 5 years or a colonoscopy every 10 years starting at age 6.  Prostate cancer screening. Recommendations will vary depending on your family history and other risks.  Hepatitis C blood test.  Hepatitis B blood test.  Sexually transmitted disease (STD) testing.  Diabetes screening. This is done by checking your blood sugar (glucose) after you have not eaten for a while (fasting). You may have this done every 1-3 years.  Abdominal aortic aneurysm (AAA) screening. You may need this if you are a current or former smoker.  Osteoporosis. You may be screened starting  at age 75 if you are at high risk. Talk with your health care provider about your test results, treatment options, and if necessary, the need for more tests. Vaccines  Your health care provider may recommend certain vaccines, such as:  Influenza vaccine. This is  recommended every year.  Tetanus, diphtheria, and acellular pertussis (Tdap, Td) vaccine. You may need a Td booster every 10 years.  Zoster vaccine. You may need this after age 43.  Pneumococcal 13-valent conjugate (PCV13) vaccine. One dose is recommended after age 71.  Pneumococcal polysaccharide (PPSV23) vaccine. One dose is recommended after age 52. Talk to your health care provider about which screenings and vaccines you need and how often you need them. This information is not intended to replace advice given to you by your health care provider. Make sure you discuss any questions you have with your health care provider. Document Released: 10/04/2015 Document Revised: 05/27/2016 Document Reviewed: 07/09/2015 Elsevier Interactive Patient Education  2017 [place] Prevention in the Home Falls can cause injuries. They can happen to people of all ages. There are many things you can do to make your home safe and to help prevent falls. What can I do on the outside of my home?  Regularly fix the edges of walkways and driveways and fix any cracks.  Remove anything that might make you trip as you walk through a door, such as a raised step or threshold.  Trim any bushes or trees on the path to your home.  Use bright outdoor lighting.  Clear any walking paths of anything that might make someone trip, such as rocks or tools.  Regularly check to see if handrails are loose or broken. Make sure that both sides of any steps have handrails.  Any raised decks and porches should have guardrails on the edges.  Have any leaves, snow, or ice cleared regularly.  Use sand or salt on walking paths during winter.  Clean up any spills in your garage right away. This includes oil or grease spills. What can I do in the bathroom?  Use night lights.  Install grab bars by the toilet and in the tub and shower. Do not use towel bars as grab bars.  Use non-skid mats or decals in the tub or  shower.  If you need to sit down in the shower, use a plastic, non-slip stool.  Keep the floor dry. Clean up any water that spills on the floor as soon as it happens.  Remove soap buildup in the tub or shower regularly.  Attach bath mats securely with double-sided non-slip rug tape.  Do not have throw rugs and other things on the floor that can make you trip. What can I do in the bedroom?  Use night lights.  Make sure that you have a light by your bed that is easy to reach.  Do not use any sheets or blankets that are too big for your bed. They should not hang down onto the floor.  Have a firm chair that has side arms. You can use this for support while you get dressed.  Do not have throw rugs and other things on the floor that can make you trip. What can I do in the kitchen?  Clean up any spills right away.  Avoid walking on wet floors.  Keep items that you use a lot in easy-to-reach places.  If you need to reach something above you, use a strong step stool that has a grab bar.  Keep electrical cords out of the way.  Do not use floor polish or wax that makes floors slippery. If you must use wax, use non-skid floor wax.  Do not have throw rugs and other things on the floor that can make you trip. What can I do with my stairs?  Do not leave any items on the stairs.  Make sure that there are handrails on both sides of the stairs and use them. Fix handrails that are broken or loose. Make sure that handrails are as long as the stairways.  Check any carpeting to make sure that it is firmly attached to the stairs. Fix any carpet that is loose or worn.  Avoid having throw rugs at the top or bottom of the stairs. If you do have throw rugs, attach them to the floor with carpet tape.  Make sure that you have a light switch at the top of the stairs and the bottom of the stairs. If you do not have them, ask someone to add them for you. What else can I do to help prevent  falls?  Wear shoes that:  Do not have high heels.  Have rubber bottoms.  Are comfortable and fit you well.  Are closed at the toe. Do not wear sandals.  If you use a stepladder:  Make sure that it is fully opened. Do not climb a closed stepladder.  Make sure that both sides of the stepladder are locked into place.  Ask someone to hold it for you, if possible.  Clearly mark and make sure that you can see:  Any grab bars or handrails.  First and last steps.  Where the edge of each step is.  Use tools that help you move around (mobility aids) if they are needed. These include:  Canes.  Walkers.  Scooters.  Crutches.  Turn on the lights when you go into a dark area. Replace any light bulbs as soon as they burn out.  Set up your furniture so you have a clear path. Avoid moving your furniture around.  If any of your floors are uneven, fix them.  If there are any pets around you, be aware of where they are.  Review your medicines with your doctor. Some medicines can make you feel dizzy. This can increase your chance of falling. Ask your doctor what other things that you can do to help prevent falls. This information is not intended to replace advice given to you by your health care provider. Make sure you discuss any questions you have with your health care provider. Document Released: 07/04/2009 Document Revised: 02/13/2016 Document Reviewed: 10/12/2014 Elsevier Interactive Patient Education  2017 [place] Heart-healthy meal planning includes:  Limiting unhealthy fats.  Increasing healthy fats.  Making other small dietary changes.  You may need to talk with your doctor or a diet specialist (dietitian) to create an eating plan that is right for you. What types of fat should I choose?  Choose healthy fats. These include olive oil and canola oil, flaxseeds, walnuts, almonds, and seeds.  Eat more omega-3 fats. These include  salmon, mackerel, sardines, tuna, flaxseed oil, and ground flaxseeds. Try to eat fish at least twice each week.  Limit saturated fats. ? Saturated fats are often found in animal products, such as meats, butter, and cream. ? Plant sources of saturated fats include palm oil, palm kernel oil, and coconut oil.  Avoid foods with partially hydrogenated oils in them. These include stick  margarine, some tub margarines, cookies, crackers, and other baked goods. These contain trans fats. What general guidelines do I need to follow?  Check food labels carefully. Identify foods with trans fats or high amounts of saturated fat.  Fill one half of your plate with vegetables and green salads. Eat 4-5 servings of vegetables per day. A serving of vegetables is: ? 1 cup of raw leafy vegetables. ?  cup of raw or cooked cut-up vegetables. ?  cup of vegetable juice.  Fill one fourth of your plate with whole grains. Look for the word "whole" as the first word in the ingredient list.  Fill one fourth of your plate with lean protein foods.  Eat 4-5 servings of fruit per day. A serving of fruit is: ? One medium whole fruit. ?  cup of dried fruit. ?  cup of fresh, frozen, or canned fruit. ?  cup of 100% fruit juice.  Eat more foods that contain soluble fiber. These include apples, broccoli, carrots, beans, peas, and barley. Try to get 20-30 g of fiber per day.  Eat more home-cooked food. Eat less restaurant, buffet, and fast food.  Limit or avoid alcohol.  Limit foods high in starch and sugar.  Avoid fried foods.  Avoid frying your food. Try baking, boiling, grilling, or broiling it instead. You can also reduce fat by: ? Removing the skin from poultry. ? Removing all visible fats from meats. ? Skimming the fat off of stews, soups, and gravies before serving them. ? Steaming vegetables in water or broth.  Lose weight if you are overweight.  Eat 4-5 servings of nuts, legumes, and seeds per  week: ? One serving of dried beans or legumes equals  cup after being cooked. ? One serving of nuts equals 1 ounces. ? One serving of seeds equals  ounce or one tablespoon.  You may need to keep track of how much salt or sodium you eat. This is especially true if you have high blood pressure. Talk with your doctor or dietitian to get more information. What foods can I eat? Grains Breads, including [place], white, pita, wheat, raisin, rye, oatmeal, and [place]. Tortillas that are neither fried nor made with lard or trans fat. Low-fat rolls, including hotdog and hamburger buns and English muffins. Biscuits. Muffins. Waffles. Pancakes. Light popcorn. Whole-grain cereals. Flatbread. Melba toast. Pretzels. Breadsticks. Rusks. Low-fat snacks. Low-fat crackers, including oyster, saltine, matzo, graham, animal, and rye. Rice and pasta, including brown rice and pastas that are made with whole wheat. Vegetables All vegetables. Fruits All fruits, but limit coconut. Meats and Other Protein Sources Lean, well-trimmed beef, veal, pork, and lamb. Chicken and [place] without skin. All fish and shellfish. Wild duck, rabbit, pheasant, and venison. Egg whites or low-cholesterol egg substitutes. Dried beans, peas, lentils, and tofu. Seeds and most nuts. Dairy Low-fat or nonfat cheeses, including ricotta, string, and mozzarella. Skim or 1% milk that is liquid, powdered, or evaporated. Buttermilk that is made with low-fat milk. Nonfat or low-fat yogurt. Beverages Mineral water. Diet carbonated beverages. Sweets and Desserts Sherbets and fruit ices. Honey, jam, marmalade, jelly, and syrups. Meringues and gelatins. Pure sugar candy, such as hard candy, jelly beans, gumdrops, mints, marshmallows, and small amounts of dark chocolate. [employer] Eat all sweets and desserts in moderation. Fats and Oils Nonhydrogenated (trans-free) margarines. Vegetable oils, including soybean, sesame, sunflower, olive, peanut,  safflower, corn, canola, and cottonseed. Salad dressings or mayonnaise made with a vegetable oil. Limit added fats and oils that you  use for cooking, baking, salads, and as spreads. Other Cocoa powder. Coffee and tea. All seasonings and condiments. The items listed above may not be a complete list of recommended foods or beverages. Contact your dietitian for more options. What foods are not recommended? Grains Breads that are made with saturated or trans fats, oils, or whole milk. Croissants. Butter rolls. Cheese breads. Sweet rolls. Donuts. Buttered popcorn. Chow mein noodles. High-fat crackers, such as cheese or butter crackers. Meats and Other Protein Sources Fatty meats, such as hotdogs, short ribs, sausage, spareribs, bacon, rib eye roast or steak, and mutton. High-fat deli meats, such as salami and bologna. Caviar. Domestic duck and goose. Organ meats, such as kidney, liver, sweetbreads, and heart. Dairy Cream, sour cream, cream cheese, and creamed cottage cheese. Whole-milk cheeses, including blue (bleu), Monte[place], [place], [place], American, [place], Swiss, cheddar, [place], and [place]. Whole or 2% milk that is liquid, evaporated, or condensed. Whole buttermilk. Cream sauce or high-fat cheese sauce. Yogurt that is made from whole milk. Beverages Regular sodas and juice drinks with added sugar. Sweets and Desserts Frosting. Pudding. Cookies. Cakes other than angel food cake. Candy that has milk chocolate or white chocolate, hydrogenated fat, butter, coconut, or unknown ingredients. Buttered syrups. Full-fat ice cream or ice cream drinks. Fats and Oils Gravy that has suet, meat fat, or shortening. Cocoa butter, hydrogenated oils, palm oil, coconut oil, palm kernel oil. These can often be found in baked products, candy, fried foods, nondairy creamers, and whipped toppings. Solid fats and shortenings, including bacon fat, salt pork, lard, and butter. Nondairy cream substitutes, such as coffee  creamers and sour cream substitutes. Salad dressings that are made of unknown oils, cheese, or sour cream. The items listed above may not be a complete list of foods and beverages to avoid. Contact your dietitian for more information. This information is not intended to replace advice given to you by your health care provider. Make sure you discuss any questions you have with your health care provider. Document Released: 03/08/2012 Document Revised: 02/13/2016 Document Reviewed: 03/01/2014 Elsevier Interactive Patient Education  [employer].

## 2018-06-10 LAB — HEMOGLOBIN A1C
Hgb A1c MFr Bld: 6.1 % of total Hgb — ABNORMAL HIGH (ref <5.7)
Mean Plasma Glucose: 128 (calc)
eAG (mmol/L): 7.1 (calc)

## 2018-06-15 ENCOUNTER — Telehealth: Payer: Self-pay | Admitting: Urology

## 2018-06-15 ENCOUNTER — Ambulatory Visit
Admission: RE | Admit: 2018-06-15 | Discharge: 2018-06-15 | Disposition: A | Discharge status: Home / Self Care | Payer: Medicare Other | Source: Ambulatory Visit | Attending: Family Medicine | Admitting: Family Medicine

## 2018-06-15 ENCOUNTER — Other Ambulatory Visit: Payer: Self-pay | Admitting: Urology

## 2018-06-15 DIAGNOSIS — I517 Cardiomegaly: Secondary | ICD-10-CM | POA: Insufficient documentation

## 2018-06-15 DIAGNOSIS — I340 Nonrheumatic mitral (valve) insufficiency: Secondary | ICD-10-CM | POA: Insufficient documentation

## 2018-06-15 DIAGNOSIS — K219 Gastro-esophageal reflux disease without esophagitis: Secondary | ICD-10-CM | POA: Diagnosis not present

## 2018-06-15 DIAGNOSIS — R011 Cardiac murmur, unspecified: Secondary | ICD-10-CM | POA: Diagnosis not present

## 2018-06-15 DIAGNOSIS — E785 Hyperlipidemia, unspecified: Secondary | ICD-10-CM | POA: Diagnosis not present

## 2018-06-15 DIAGNOSIS — R600 Localized edema: Secondary | ICD-10-CM

## 2018-06-15 DIAGNOSIS — N5089 Other specified disorders of the male genital organs: Secondary | ICD-10-CM

## 2018-06-15 NOTE — Telephone Encounter (Signed)
Left message for patient to cb   [facility]

## 2018-06-15 NOTE — Telephone Encounter (Signed)
-----   Message from Abbie Sons, MD sent at 06/15/2018  7:52 AM EDT ----- Please let patient know Dr. Sanda Klein contacted me regarding persistent scrotal swelling after hydrocelectomy.  I put in a scrotal ultrasound order and once it is performed will call with results.

## 2018-06-15 NOTE — Progress Notes (Signed)
*  PRELIMINARY RESULTS* Echocardiogram 2D Echocardiogram has been performed.  Sherrie Sport 06/15/2018, 11:55 AM

## 2018-06-17 ENCOUNTER — Telehealth: Payer: Self-pay | Admitting: Family Medicine

## 2018-06-17 ENCOUNTER — Encounter: Payer: Self-pay | Admitting: Family Medicine

## 2018-06-17 DIAGNOSIS — I340 Nonrheumatic mitral (valve) insufficiency: Secondary | ICD-10-CM

## 2018-06-17 DIAGNOSIS — I5189 Other ill-defined heart diseases: Secondary | ICD-10-CM

## 2018-06-17 DIAGNOSIS — I371 Nonrheumatic pulmonary valve insufficiency: Secondary | ICD-10-CM | POA: Insufficient documentation

## 2018-06-17 DIAGNOSIS — I517 Cardiomegaly: Secondary | ICD-10-CM

## 2018-06-17 DIAGNOSIS — I071 Rheumatic tricuspid insufficiency: Secondary | ICD-10-CM | POA: Insufficient documentation

## 2018-06-17 HISTORY — DX: Nonrheumatic mitral (valve) insufficiency: I34.0

## 2018-06-17 HISTORY — DX: Other ill-defined heart diseases: I51.89

## 2018-06-17 HISTORY — DX: Nonrheumatic pulmonary valve insufficiency: I37.1

## 2018-06-17 HISTORY — DX: Cardiomegaly: I51.7

## 2018-06-17 NOTE — Telephone Encounter (Signed)
Pt.notified

## 2018-06-17 NOTE — Telephone Encounter (Signed)
Pt calling stating that he would like to know how would he be able to DECREASE the amlodipine from 10 mg daily to 5 mg daily please give him a call at [phone] home number

## 2018-06-17 NOTE — Telephone Encounter (Signed)
Pt notified and scheduled for next friday

## 2018-06-17 NOTE — Telephone Encounter (Signed)
Please have patient schedule an appointment with me next week (end of the week is fine) to go over his echocardiogram; nothing to lose sleep over but more than I can explain on the phone Let's also have him DECREASE the amlodipine from 10 mg daily to 5 mg daily and have him monitor his BP daily; call if trending up over 150

## 2018-06-18 ENCOUNTER — Other Ambulatory Visit: Payer: Self-pay | Admitting: Family Medicine

## 2018-06-18 DIAGNOSIS — R251 Tremor, unspecified: Secondary | ICD-10-CM

## 2018-06-23 ENCOUNTER — Other Ambulatory Visit: Payer: Self-pay | Admitting: Urology

## 2018-06-24 ENCOUNTER — Encounter: Payer: Self-pay | Admitting: Family Medicine

## 2018-06-24 ENCOUNTER — Ambulatory Visit: Payer: Medicare Other | Admitting: Family Medicine

## 2018-06-24 DIAGNOSIS — E669 Obesity, unspecified: Secondary | ICD-10-CM

## 2018-06-24 DIAGNOSIS — I517 Cardiomegaly: Secondary | ICD-10-CM | POA: Diagnosis not present

## 2018-06-24 DIAGNOSIS — I340 Nonrheumatic mitral (valve) insufficiency: Secondary | ICD-10-CM | POA: Diagnosis not present

## 2018-06-24 DIAGNOSIS — I361 Nonrheumatic tricuspid (valve) insufficiency: Secondary | ICD-10-CM | POA: Diagnosis not present

## 2018-06-24 DIAGNOSIS — I371 Nonrheumatic pulmonary valve insufficiency: Secondary | ICD-10-CM | POA: Diagnosis not present

## 2018-06-24 MED ORDER — MEDICAL COMPRESSION STOCKINGS MISC: 1 refills | Status: DC

## 2018-06-24 MED ORDER — AMLODIPINE BESYLATE 5 MG PO TABS: 5.0000 mg | ORAL_TABLET | Freq: Every day | ORAL | 3 refills | Status: DC

## 2018-06-24 NOTE — Assessment & Plan Note (Signed)
Reviewed echo results.

## 2018-06-24 NOTE — Assessment & Plan Note (Addendum)
Most likely due to mitral regurg; discussed risk of atrial firbillation; encouraged weight loss

## 2018-06-24 NOTE — Assessment & Plan Note (Signed)
Noted on echo.

## 2018-06-24 NOTE — Assessment & Plan Note (Signed)
Likely causing LA dilatation; notified patient about increase risk of a-fib in the future, go to the ER if any palpitations and irregular heart beats, esp if [facility]

## 2018-06-24 NOTE — Patient Instructions (Signed)
We'll have Dr. Rockey Situ review your echocardiogram Check out the information at familydoctor.org entitled "Nutrition for Weight Loss: What You Need to Know about Fad Diets" Try to lose between 1-2 pounds per week by taking in fewer calories and burning off more calories You can succeed by limiting portions, limiting foods dense in calories and fat, becoming more active, and drinking 8 glasses of water a day (64 ounces) Don't skip meals, especially breakfast, as skipping meals may alter your metabolism Do not use over-the-counter weight loss pills or gimmicks that claim rapid weight loss A healthy BMI (or body mass index) is between 18.5 and 24.9 You can calculate your ideal BMI at the [place] website ClubMon[employer].fr Try the compression stockings, wear daily, but do not sleep in them

## 2018-06-24 NOTE — Progress Notes (Signed)
BP 128/78   Pulse 68   Temp 99.1 F (37.3 C)   Ht 6' (1.829 m)   Wt 243 lb (110.2 kg)   SpO2 99%   BMI 32.96 kg/m    Subjective:    [name], male    DOB: 01/21/1941, 77 y.o.   MRN: [id number]  HPI: Jesus Dillon is a 77 y.o. male  Chief Complaint  Patient presents with  . Follow-up    HPI Patient is here with his wife for f/u; review of the echocardiogram done  Bilateral leg edema; it is much better; the swelling has gone down  No recollection of rheumatic fever; he recalls having a murmur back in high school He was in [place] and they gave him NTG pills to use if needed   Depression screen [facility] 2/9 06/24/2018 06/09/2018 06/06/2018 01/14/2018 12/20/2017  Decreased Interest 0 0 0 0 0  Down, Depressed, Hopeless 0 0 0 0 0  PHQ - 2 Score 0 0 0 0 0  Altered sleeping 0 0 1 - -  Tired, decreased energy 0 0 1 - -  Change in appetite 0 0 0 - -  Feeling bad or failure about yourself  0 0 0 - -  Trouble concentrating 0 0 0 - -  Moving slowly or fidgety/restless 0 0 0 - -  Suicidal thoughts 0 0 0 - -  PHQ-9 Score 0 0 2 - -  Difficult doing work/chores Not difficult at all Not difficult at all Not difficult at all - -   Fall Risk  06/24/2018 06/09/2018 06/06/2018 01/14/2018 12/20/2017  Falls in the past year? No No No No No  Risk for fall due to : - Impaired vision - - -  Risk for fall due to: Comment - wears eyeglasses - - -    Relevant past medical, surgical, family and social history reviewed Past Medical History:  Diagnosis Date  . Arthritis   . Diastolic dysfunction 6/96/2952   Grade 1, echo Sept 2019  . GERD (gastroesophageal reflux disease)   . Gout   . HBP (high blood pressure)   . Heart murmur   . Hyperlipidemia   . 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 Mitral regurgitation 06/17/2018   Echo Sept 2019  . Pulmonary regurgitation 06/17/2018   Echo Sept 2019  . Tremors of nervous system    Past Surgical History:  Procedure Laterality Date    . ANAL FISSURECTOMY    . HYDROCELE EXCISION Left 03/01/2018   Procedure: HYDROCELECTOMY ADULT;  Surgeon: Abbie Sons, MD;  Location: [facility] ORS;  Service: Urology;  Laterality: Left;   Family History  Problem Relation Age of Onset  . Hypertension Mother   . Dementia Mother   . Angina Mother   . Diabetes Brother   . Hypertension Brother   . Blindness Maternal Grandmother    Social History   Tobacco Use  . Smoking status: Never Smoker  . Smokeless tobacco: Never Used  . Tobacco comment: smoking cessation materials not required  Substance Use Topics  . Alcohol use: No    Alcohol/week: 0.0 standard drinks  . Drug use: No     Office Visit from 06/24/2018 in [facility]  AUDIT-C Score  0      Interim medical history since last visit reviewed. Allergies and medications reviewed  Review of Systems Per HPI unless specifically indicated above     Objective:  BP 128/78   Pulse 68   Temp 99.1 F (37.3 C)   Ht 6' (1.829 m)   Wt 243 lb (110.2 kg)   SpO2 99%   BMI 32.96 kg/m   Wt Readings from Last 3 Encounters:  06/24/18 243 lb (110.2 kg)  06/09/18 245 lb 1.6 oz (111.2 kg)  06/06/18 244 lb 14.4 oz (111.1 kg)    Physical Exam  Constitutional: He appears well-developed and well-nourished. No distress.  Obese; weight loss 2+ pounds acknowledged  Eyes: No scleral icterus.  Cardiovascular: Normal rate.  Pulmonary/Chest: Effort normal.  Musculoskeletal: He exhibits edema (LE edema improved since last visit).  Neurological: He is alert.  Psychiatric: He has a normal mood and affect.      Assessment & Plan:   Problem List Items Addressed This Visit      Cardiovascular and Mediastinum   Tricuspid regurgitation    Reviewed echo results      Relevant Medications   amLODipine (NORVASC) 5 MG tablet   Pulmonary regurgitation    Noted on echo      Relevant Medications   amLODipine (NORVASC) 5 MG tablet   Mitral regurgitation    Likely  causing LA dilatation; notified patient about increase risk of a-fib in the future, go to the ER if any palpitations and irregular heart beats, esp if [facility]      Relevant Medications   amLODipine (NORVASC) 5 MG tablet   Left atrial dilation    Most likely due to mitral regurg; discussed risk of atrial firbillation; encouraged weight loss      Relevant Medications   amLODipine (NORVASC) 5 MG tablet     Other   Obesity (BMI 30.0-34.9)    Praise given for weight loss since last visit; important to work on weight loss for cardiac health          Follow up plan: No follow-ups on file.  An after-visit summary was printed and given to the patient at [facility].  Please see the patient instructions which may contain other information and recommendations beyond what is mentioned above in the assessment and plan.  Meds ordered this encounter  Medications  . amLODipine (NORVASC) 5 MG tablet    Sig: Take 1 tablet (5 mg total) by mouth daily.    Dispense:  90 tablet    Refill:  3    Lower dose  . Elastic Bandages & Supports (MEDICAL COMPRESSION STOCKINGS) MISC    Sig: Wear on both legs; put on in the morning, remove at night; do NOT sleep in these    Dispense:  2 each    Refill:  1    No orders of the defined types were placed in this encounter.

## 2018-06-29 ENCOUNTER — Encounter: Payer: Self-pay | Admitting: *Deleted

## 2018-07-04 DIAGNOSIS — E669 Obesity, unspecified: Secondary | ICD-10-CM | POA: Insufficient documentation

## 2018-07-04 NOTE — Assessment & Plan Note (Signed)
Praise given for weight loss since last visit; important to work on weight loss for cardiac health

## 2018-07-13 ENCOUNTER — Other Ambulatory Visit: Payer: Self-pay

## 2018-07-13 DIAGNOSIS — Z1211 Encounter for screening for malignant neoplasm of colon: Secondary | ICD-10-CM

## 2018-08-02 ENCOUNTER — Ambulatory Visit: Payer: Medicare Other | Admitting: Urology

## 2018-08-02 ENCOUNTER — Encounter: Payer: Self-pay | Admitting: Urology

## 2018-08-02 VITALS — BP 180/90 | HR 60 | Ht 72.0 in | Wt 240.7 lb

## 2018-08-02 DIAGNOSIS — N5089 Other specified disorders of the male genital organs: Secondary | ICD-10-CM | POA: Diagnosis not present

## 2018-08-04 ENCOUNTER — Encounter: Payer: Self-pay | Admitting: Urology

## 2018-08-04 NOTE — Progress Notes (Signed)
08/02/2018 8:11 AM   Jesus Dillon [birth year] [id number]  Referring provider: Arnetha Courser, MD [address] [place], [place] [address]  Chief Complaint  Patient presents with  . Follow-up    HPI: 77 year old male who underwent a left hydrocelectomy in June 2019.  His drain was removed 2 days postop and in follow-up he had persistent moderate swelling.  No significant pain or tenderness.  Observation was recommended and he presents for follow-up.  He has noted marked decrease size of his left hemiscrotum.  He has no complaints today.   PMH: Past Medical History:  Diagnosis Date  . Arthritis   . Diastolic dysfunction 1/91/4782   Grade 1, echo Sept 2019  . GERD (gastroesophageal reflux disease)   . Gout   . HBP (high blood pressure)   . Heart murmur   . Hyperlipidemia   . 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 Mitral regurgitation 06/17/2018   Echo Sept 2019  . Pulmonary regurgitation 06/17/2018   Echo Sept 2019  . Tremors of nervous system     Surgical History: Past Surgical History:  Procedure Laterality Date  . ANAL FISSURECTOMY    . HYDROCELE EXCISION Left 03/01/2018   Procedure: HYDROCELECTOMY ADULT;  Surgeon: Abbie Sons, MD;  Location: [facility] ORS;  Service: Urology;  Laterality: Left;    Home Medications:  Allergies as of 08/02/2018      Reactions   Penicillins Rash   Has patient had a PCN reaction causing immediate rash, facial/tongue/throat swelling, SOB or lightheadedness with hypotension: no Has patient had a PCN reaction causing severe rash involving mucus membranes or skin necrosis: no Has patient had a PCN reaction that required hospitalization: no Has patient had a PCN reaction occurring within the last 10 years: no If all of the above answers are "NO", then may proceed with Cephalosporin use.      Medication List        Accurate as of 08/02/18 11:59 PM. Always use your most recent med list.          allopurinol  100 MG tablet Commonly known as:  ZYLOPRIM Take 2 tablets (200 mg total) by mouth daily.   amLODipine 5 MG tablet Commonly known as:  NORVASC Take 1 tablet (5 mg total) by mouth daily.   aspirin EC 81 MG tablet Take 1 tablet (81 mg total) by mouth daily. No NSAIDs for at least one hour   atorvastatin 40 MG tablet Commonly known as:  LIPITOR Take 1 tablet (40 mg total) by mouth at bedtime.   cholecalciferol 1000 units tablet Commonly known as:  VITAMIN D Take 1,000 Units by mouth daily.   diclofenac sodium 1 % Gel Commonly known as:  VOLTAREN Apply 4 g topically 4 (four) times daily.   gabapentin 300 MG capsule Commonly known as:  NEURONTIN TAKE 1 CAPSULE THREE TIMES A DAY   Medical Compression Stockings Misc Wear on both legs; put on in the morning, remove at night; do NOT sleep in these   propranolol 10 MG tablet Commonly known as:  INDERAL TAKE 1 TABLET THREE TIMES A DAY   telmisartan 80 MG tablet Commonly known as:  MICARDIS Take 1 tablet (80 mg total) by mouth daily. (this replaces losartan); for blood pressure       Allergies:  Allergies  Allergen Reactions  . Penicillins Rash    Has patient had a PCN reaction causing immediate rash, facial/tongue/throat swelling, SOB or lightheadedness  with hypotension: no Has patient had a PCN reaction causing severe rash involving mucus membranes or skin necrosis: no Has patient had a PCN reaction that required hospitalization: no Has patient had a PCN reaction occurring within the last 10 years: no If all of the above answers are "NO", then may proceed with Cephalosporin use.     Family History: Family History  Problem Relation Age of Onset  . Hypertension Mother   . Dementia Mother   . Angina Mother   . Diabetes Brother   . Hypertension Brother   . Blindness Maternal Grandmother     Social History:  reports that he has never smoked. He has never used smokeless tobacco. He reports that he does not drink alcohol  or use drugs.  ROS: UROLOGY Frequent Urination?: No Hard to postpone urination?: No Burning/pain with urination?: No Get up at night to urinate?: No Leakage of urine?: No Urine stream starts and stops?: No Trouble starting stream?: No Do you have to strain to urinate?: No Blood in urine?: No Urinary tract infection?: No Sexually transmitted disease?: No Injury to kidneys or bladder?: No Painful intercourse?: No Weak stream?: No Erection problems?: No Penile pain?: No  Gastrointestinal Nausea?: No Vomiting?: No Indigestion/heartburn?: No Diarrhea?: No Constipation?: No  Constitutional Fever: No Night sweats?: No Weight loss?: No Fatigue?: No  Skin Skin rash/lesions?: No Itching?: No  Eyes Blurred vision?: No Double vision?: No  Ears/Nose/Throat Sore throat?: No Sinus problems?: No  Hematologic/Lymphatic Swollen glands?: No Easy bruising?: No  Cardiovascular Leg swelling?: No Chest pain?: No  Respiratory Cough?: No Shortness of breath?: No  Endocrine Excessive thirst?: No  Musculoskeletal Back pain?: No Joint pain?: No  Neurological Headaches?: No Dizziness?: No  Psychologic Depression?: No Anxiety?: No  Physical Exam: BP (!) 180/90 (BP Location: Left Arm, Patient Position: Sitting, Cuff Size: Large)   Pulse 60   Ht 6' (1.829 m)   Wt 240 lb 11.2 oz (109.2 kg)   BMI 32.64 kg/m   Constitutional:  Alert and oriented, No acute distress. HEENT: [place] AT, moist mucus membranes.  Trachea midline, no masses. Cardiovascular: No clubbing, cyanosis, or edema. Respiratory: Normal respiratory effort, no increased work of breathing. GI: Abdomen is soft, nontender, nondistended, no abdominal masses GU: No CVA tenderness.  Significant decrease in the left hemiscrotal swelling.  The left testis is palpable.     Assessment & Plan:   Significant decreased size and postoperative left hemiscrotal swelling after hydrocelectomy.  He desires to continue  observation and will recheck in 6 months.   Return in about 6 months (around 01/31/2019) for Recheck.  Abbie Sons, [place] [address] [place], [place] [address] [phone]

## 2018-08-25 ENCOUNTER — Ambulatory Visit: Payer: Medicare Other | Admitting: Anesthesiology

## 2018-08-25 ENCOUNTER — Encounter
Admission: RE | Disposition: A | Discharge status: Home / Self Care | Payer: Self-pay | Source: Ambulatory Visit | Attending: Gastroenterology

## 2018-08-25 ENCOUNTER — Encounter: Payer: Self-pay | Admitting: *Deleted

## 2018-08-25 ENCOUNTER — Ambulatory Visit
Admission: RE | Admit: 2018-08-25 | Discharge: 2018-08-25 | Disposition: A | Discharge status: Home / Self Care | Payer: Medicare Other | Source: Ambulatory Visit | Attending: Gastroenterology | Admitting: Gastroenterology

## 2018-08-25 DIAGNOSIS — D123 Benign neoplasm of transverse colon: Secondary | ICD-10-CM | POA: Diagnosis not present

## 2018-08-25 DIAGNOSIS — Z7982 Long term (current) use of aspirin: Secondary | ICD-10-CM | POA: Insufficient documentation

## 2018-08-25 DIAGNOSIS — K635 Polyp of colon: Secondary | ICD-10-CM | POA: Diagnosis not present

## 2018-08-25 DIAGNOSIS — D124 Benign neoplasm of descending colon: Secondary | ICD-10-CM | POA: Insufficient documentation

## 2018-08-25 DIAGNOSIS — E785 Hyperlipidemia, unspecified: Secondary | ICD-10-CM | POA: Insufficient documentation

## 2018-08-25 DIAGNOSIS — Z79899 Other long term (current) drug therapy: Secondary | ICD-10-CM | POA: Diagnosis not present

## 2018-08-25 DIAGNOSIS — D125 Benign neoplasm of sigmoid colon: Secondary | ICD-10-CM

## 2018-08-25 DIAGNOSIS — Z1211 Encounter for screening for malignant neoplasm of colon: Secondary | ICD-10-CM | POA: Insufficient documentation

## 2018-08-25 DIAGNOSIS — I10 Essential (primary) hypertension: Secondary | ICD-10-CM | POA: Diagnosis not present

## 2018-08-25 DIAGNOSIS — M109 Gout, unspecified: Secondary | ICD-10-CM | POA: Diagnosis not present

## 2018-08-25 HISTORY — PX: COLONOSCOPY WITH PROPOFOL: SHX5780

## 2018-08-25 SURGERY — COLONOSCOPY WITH PROPOFOL
Anesthesia: General

## 2018-08-25 MED ORDER — PROPOFOL 10 MG/ML IV BOLUS
INTRAVENOUS | Status: DC | PRN
  Administered 2018-08-25: 100 mg via INTRAVENOUS

## 2018-08-25 MED ORDER — PROPOFOL 500 MG/50ML IV EMUL
INTRAVENOUS | Status: AC
  Filled 2018-08-25: qty 50

## 2018-08-25 MED ORDER — PROPOFOL 10 MG/ML IV BOLUS
INTRAVENOUS | Status: AC
  Filled 2018-08-25: qty 20

## 2018-08-25 MED ORDER — LIDOCAINE HCL (CARDIAC) PF 100 MG/5ML IV SOSY
PREFILLED_SYRINGE | INTRAVENOUS | Status: DC | PRN
  Administered 2018-08-25: 100 mg via INTRAVENOUS

## 2018-08-25 MED ORDER — SODIUM CHLORIDE 0.9 % IV SOLN
INTRAVENOUS | Status: DC
  Administered 2018-08-25: 07:00:00 via INTRAVENOUS

## 2018-08-25 MED ORDER — PROPOFOL 500 MG/50ML IV EMUL
INTRAVENOUS | Status: DC | PRN
  Administered 2018-08-25: 150 ug/kg/min via INTRAVENOUS

## 2018-08-25 NOTE — Anesthesia Postprocedure Evaluation (Signed)
Anesthesia Post Note  Patient: Jesus Dillon  Procedure(s) Performed: COLONOSCOPY WITH PROPOFOL (N/A )  Patient location during evaluation: Endoscopy Anesthesia Type: General Level of consciousness: awake and alert Pain management: pain level controlled Vital Signs Assessment: post-procedure vital signs reviewed and stable Respiratory status: spontaneous breathing, nonlabored ventilation, respiratory function stable and patient connected to nasal cannula oxygen Cardiovascular status: blood pressure returned to baseline and stable Postop Assessment: no apparent nausea or vomiting Anesthetic complications: no     Last Vitals:  Vitals:   08/25/18 0830 08/25/18 0840  BP: 115/65 (!) 126/58  Pulse: (!) 58 (!) 56  Resp: 14 18  Temp:    SpO2: 100% 100%    Last Pain:  Vitals:   08/25/18 0840  TempSrc:   PainSc: 0-No pain                 Martha Clan

## 2018-08-25 NOTE — Op Note (Signed)
[facility] Patient Name: Jesus Dillon Procedure Date: 08/25/2018 7:21 AM MRN: [id number] Account #: [id number] Date of Birth: [birth year] Admit Type: Outpatient Age: 77 Room: [facility] ENDO ROOM 4 Gender: Male Note Status: Finalized Procedure:            Colonoscopy Indications:          Screening for colorectal malignant neoplasm Providers:            Jonathon Bellows MD, MD Referring MD:         Arnetha Courser (Referring MD) Medicines:            Monitored Anesthesia Care Complications:        No immediate complications. Procedure:            Pre-Anesthesia Assessment:                       - Prior to the procedure, a History and Physical was                        performed, and patient medications, allergies and                        sensitivities were reviewed. The patient's tolerance of                        previous anesthesia was reviewed.                       - The risks and benefits of the procedure and the                        sedation options and risks were discussed with the                        patient. All questions were answered and informed                        consent was obtained.                       - ASA Grade Assessment: II - A patient with mild                        systemic disease.                       After obtaining informed consent, the colonoscope was                        passed under direct vision. Throughout the procedure,                        the patient's blood pressure, pulse, and oxygen                        saturations were monitored continuously. The                        Colonoscope was introduced through the anus and  advanced to the the cecum, identified by the                        appendiceal orifice, IC valve and transillumination.                        The colonoscopy was performed with ease. The patient                        tolerated the procedure well. The quality of  the bowel                        preparation was good. Findings:      The perianal and digital rectal examinations were normal.      Four sessile polyps were found in the transverse colon. The polyps were       4 to 6 mm in size. These polyps were removed with a cold snare.       Resection and retrieval were complete.      A 4 mm polyp was found in the descending colon. The polyp was sessile.       The polyp was removed with a cold snare. Resection and retrieval were       complete.      Four sessile polyps were found in the transverse colon. The polyps were       3 to 4 mm in size. These polyps were removed with a cold biopsy forceps.       Resection and retrieval were complete. To prevent bleeding after the       polypectomy, one hemostatic clip was successfully placed. There was no       bleeding at the end of the procedure.      A 3 mm polyp was found in the sigmoid colon. The polyp was sessile. The       polyp was removed with a cold biopsy forceps. Resection and retrieval       were complete.      The exam was otherwise without abnormality on direct and retroflexion       views. Impression:           - Four 4 to 6 mm polyps in the transverse colon,                        removed with a cold snare. Resected and retrieved.                       - One 4 mm polyp in the descending colon, removed with                        a cold snare. Resected and retrieved.                       - Four 3 to 4 mm polyps in the transverse colon,                        removed with a cold biopsy forceps. Resected and                        retrieved.                       -  One 3 mm polyp in the sigmoid colon, removed with a                        cold biopsy forceps. Resected and retrieved.                       - The examination was otherwise normal on direct and                        retroflexion views. Recommendation:       - Discharge patient to home (with escort).                       -  Resume previous diet.                       - Continue present medications.                       - Await pathology results.                       - Repeat colonoscopy in 3 years for surveillance. Procedure Code(s):    --- Professional ---                       [phone], Colonoscopy, flexible; with removal of tumor(s),                        polyp(s), or other lesion(s) by snare technique                       [id number], 19, Colonoscopy, flexible; with biopsy, single                        or multiple Diagnosis Code(s):    --- Professional ---                       Z12.11, Encounter for screening for malignant neoplasm                        of colon                       D12.3, Benign neoplasm of transverse colon (hepatic                        flexure or splenic flexure)                       D12.4, Benign neoplasm of descending colon                       D12.5, Benign neoplasm of sigmoid colon CPT copyright 2018 American Medical Association. All rights reserved. The codes documented in this report are preliminary and upon coder review may  be revised to meet current compliance requirements. Jonathon Bellows, MD Jonathon Bellows MD, MD 08/25/2018 8:08:05 AM This report has been signed electronically. Number of Addenda: 0 Note Initiated On: 08/25/2018 7:21 AM Scope Withdrawal Time: 0 hours 12 minutes 3 seconds  Total Procedure Duration: 0 hours 24 minutes 54 seconds       [facility]

## 2018-08-25 NOTE — Anesthesia Post-op Follow-up Note (Signed)
Anesthesia QCDR form completed.        

## 2018-08-25 NOTE — H&P (Signed)
Jesus Bellows, MD [address] [place], [place], [place], [address] [address], [place], [place], [place], [address] Phone: [phone]  Fax: [phone]  Primary Care Physician:  Arnetha Courser, MD   Pre-Procedure History & Physical: HPI:  Jesus Dillon is a 77 y.o. male is here for an colonoscopy.   Past Medical History:  Diagnosis Date  . Arthritis   . Diastolic dysfunction 05/19/9370   Grade 1, echo Sept 2019  . GERD (gastroesophageal reflux disease)   . Gout   . HBP (high blood pressure)   . Heart murmur   . Hyperlipidemia   . 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 Mitral regurgitation 06/17/2018   Echo Sept 2019  . Pulmonary regurgitation 06/17/2018   Echo Sept 2019  . Tremors of nervous system     Past Surgical History:  Procedure Laterality Date  . ANAL FISSURECTOMY    . HYDROCELE EXCISION Left 03/01/2018   Procedure: HYDROCELECTOMY ADULT;  Surgeon: Abbie Sons, MD;  Location: [facility] ORS;  Service: Urology;  Laterality: Left;    Prior to Admission medications   Medication Sig Start Date End Date Taking? Authorizing Provider  amLODipine (NORVASC) 5 MG tablet Take 1 tablet (5 mg total) by mouth daily. 06/24/18  Yes Lada, Satira Anis, MD  aspirin EC 81 MG tablet Take 1 tablet (81 mg total) by mouth daily. No NSAIDs for at least one hour 12/20/17  Yes Lada, Satira Anis, MD  atorvastatin (LIPITOR) 40 MG tablet Take 1 tablet (40 mg total) by mouth at bedtime. 06/08/18  Yes Lada, Satira Anis, MD  cholecalciferol (VITAMIN D) 1000 units tablet Take 1,000 Units by mouth daily.   Yes [provider]  diclofenac sodium (VOLTAREN) 1 % GEL Apply 4 g topically 4 (four) times daily. Patient taking differently: Apply 4 g topically as needed.  06/11/14  Yes Hyatt, Max T, DPM  propranolol (INDERAL) 10 MG tablet TAKE 1 TABLET THREE TIMES A DAY 06/20/18  Yes Poulose, Bethel Born, NP  allopurinol (ZYLOPRIM) 100 MG tablet Take 2 tablets (200 mg total) by mouth  daily. 12/20/17   Arnetha Courser, MD  Elastic Bandages & Supports (MEDICAL COMPRESSION STOCKINGS) MISC Wear on both legs; put on in the morning, remove at night; do NOT sleep in these 06/24/18   Lada, Satira Anis, MD  gabapentin (NEURONTIN) 300 MG capsule TAKE 1 CAPSULE THREE TIMES A DAY 03/02/18   Lada, Satira Anis, MD  telmisartan (MICARDIS) 80 MG tablet Take 1 tablet (80 mg total) by mouth daily. (this replaces losartan); for blood pressure 01/04/18   Arnetha Courser, MD    Allergies as of 07/13/2018 - Review Complete 06/24/2018  Allergen Reaction Noted  . Penicillins Rash 07/31/2013    Family History  Problem Relation Age of Onset  . Hypertension Mother   . Dementia Mother   . Angina Mother   . Diabetes Brother   . Hypertension Brother   . Blindness Maternal Grandmother     Social History   Socioeconomic History  . Marital status: Married    Spouse name: Levander Campion  . Number of children: 1  . Years of education: Not on file  . Highest education level: Master's degree (e.g., MA, MS, MEng, MEd, MSW, MBA)  Occupational History  . Occupation: Retired  Scientific laboratory technician  . Financial resource strain: Not hard at all  . Food insecurity:    Worry: Never true    Inability:  Never true  . Transportation needs:    Medical: No    Non-medical: No  Tobacco Use  . Smoking status: Never Smoker  . Smokeless tobacco: Never Used  . Tobacco comment: smoking cessation materials not required  Substance and Sexual Activity  . Alcohol use: No    Alcohol/week: 0.0 standard drinks  . Drug use: No  . Sexual activity: Yes    Partners: Female  Lifestyle  . Physical activity:    Days per week: 0 days    Minutes per session: 0 min  . Stress: Not at all  Relationships  . Social connections:    Talks on phone: Patient refused    Gets together: Patient refused    Attends religious service: Patient refused    Active member of club or organization: Patient refused    Attends meetings of clubs or  organizations: Patient refused    Relationship status: Married  . Intimate partner violence:    Fear of current or ex partner: No    Emotionally abused: No    Physically abused: No    Forced sexual activity: No  Other Topics Concern  . Not on file  Social History Narrative  . Not on file    Review of Systems: See HPI, otherwise negative ROS  Physical Exam: BP (!) 169/82   Pulse 60   Temp (!) 96.7 F (35.9 C) (Tympanic)   Resp 18   Ht 6' (1.829 m)   Wt 108.9 kg   SpO2 100%   BMI 32.55 kg/m  General:   Alert,  pleasant and cooperative in NAD Head:  Normocephalic and atraumatic. Neck:  Supple; no masses or thyromegaly. Lungs:  Clear throughout to auscultation, normal respiratory effort.    Heart:  +S1, +S2, Regular rate and rhythm, No edema. Abdomen:  Soft, nontender and nondistended. Normal bowel sounds, without guarding, and without rebound.   Neurologic:  Alert and  oriented x4;  grossly normal neurologically.  Impression/Plan: Jesus Dillon is here for an colonoscopy to be performed for Screening colonoscopy average risk   Risks, benefits, limitations, and alternatives regarding  colonoscopy have been reviewed with the patient.  Questions have been answered.  All parties agreeable.   Jesus Bellows, MD  08/25/2018, 7:34 AM

## 2018-08-25 NOTE — Transfer of Care (Signed)
Immediate Anesthesia Transfer of Care Note  Patient: Jesus Dillon  Procedure(s) Performed: COLONOSCOPY WITH PROPOFOL (N/A )  Patient Location: Endoscopy Unit  Anesthesia Type:General  Level of Consciousness: sedated  Airway & Oxygen Therapy: Patient Spontanous Breathing and Patient connected to nasal cannula oxygen  Post-op Assessment: Report given to RN and Post -op Vital signs reviewed and stable  Post vital signs: Reviewed and stable  Last Vitals:  Vitals Value Taken Time  BP 86/62 08/25/2018  8:10 AM  Temp 36.7 C 08/25/2018  8:10 AM  Pulse 57 08/25/2018  8:11 AM  Resp 11 08/25/2018  8:11 AM  SpO2 98 % 08/25/2018  8:11 AM  Vitals shown include unvalidated device data.  Last Pain:  Vitals:   08/25/18 0810  TempSrc: Tympanic  PainSc: Asleep         Complications: No apparent anesthesia complications

## 2018-08-25 NOTE — Anesthesia Preprocedure Evaluation (Signed)
Anesthesia Evaluation  Patient identified by MRN, date of birth, ID band Patient awake    Reviewed: Allergy & Precautions, NPO status , Patient's Chart, lab work & pertinent test results, reviewed documented beta blocker date and time   History of Anesthesia Complications Negative for: history of anesthetic complications  Airway Mallampati: III       Dental  (+) Dental Advidsory Given, Teeth Intact   Pulmonary neg pulmonary ROS,           Cardiovascular hypertension, Pt. on medications and Pt. on home beta blockers (-) angina(-) CAD, (-) Past MI, (-) Cardiac Stents and (-) CABG (-) dysrhythmias + Valvular Problems/Murmurs      Neuro/Psych neg Seizures  Neuromuscular disease negative psych ROS   GI/Hepatic Neg liver ROS, GERD  ,  Endo/Other  negative endocrine ROS  Renal/GU negative Renal ROS  negative genitourinary   Musculoskeletal  (+) Arthritis , Osteoarthritis,    Abdominal Normal abdominal exam  (+)   Peds negative pediatric ROS (+)  Hematology negative hematology ROS (+)   Anesthesia Other Findings Past Medical History: No date: Arthritis 0/76/1518: Diastolic dysfunction     Comment:  Grade 1, echo Sept 2019 No date: GERD (gastroesophageal reflux disease) No date: Gout No date: HBP (high blood pressure) No date: Heart murmur No date: Hyperlipidemia 06/17/2018: Left atrial dilation     Comment:  Echo Sept 2019 06/17/2018: Mitral regurgitation     Comment:  Echo Sept 2019 06/17/2018: Pulmonary regurgitation     Comment:  Echo Sept 2019 No date: Tremors of nervous system   Reproductive/Obstetrics                             Anesthesia Physical  Anesthesia Plan  ASA: II  Anesthesia Plan: General   Post-op Pain Management:    Induction: Intravenous  PONV Risk Score and Plan: 2 and Propofol infusion and TIVA  Airway Management Planned: Natural Airway and Nasal  Cannula  Additional Equipment:   Intra-op Plan:   Post-operative Plan:   Informed Consent: I have reviewed the patients History and Physical, chart, labs and discussed the procedure including the risks, benefits and alternatives for the proposed anesthesia with the patient or authorized representative who has indicated his/her understanding and acceptance.   Dental advisory given  Plan Discussed with: CRNA and Surgeon  Anesthesia Plan Comments:         Anesthesia Quick Evaluation

## 2018-08-26 ENCOUNTER — Encounter: Payer: Self-pay | Admitting: Gastroenterology

## 2018-08-26 LAB — SURGICAL PATHOLOGY

## 2018-08-29 ENCOUNTER — Encounter: Payer: Self-pay | Admitting: Gastroenterology

## 2018-08-30 ENCOUNTER — Encounter (INDEPENDENT_AMBULATORY_CARE_PROVIDER_SITE_OTHER): Payer: Self-pay

## 2018-09-20 ENCOUNTER — Other Ambulatory Visit: Payer: Self-pay | Admitting: Family Medicine

## 2018-09-20 DIAGNOSIS — M5441 Lumbago with sciatica, right side: Principal | ICD-10-CM

## 2018-09-20 DIAGNOSIS — G8929 Other chronic pain: Secondary | ICD-10-CM

## 2018-10-18 ENCOUNTER — Ambulatory Visit: Payer: Medicare Other | Admitting: Family Medicine

## 2018-10-18 ENCOUNTER — Encounter: Payer: Self-pay | Admitting: Family Medicine

## 2018-10-18 DIAGNOSIS — M47816 Spondylosis without myelopathy or radiculopathy, lumbar region: Secondary | ICD-10-CM

## 2018-10-18 DIAGNOSIS — E782 Mixed hyperlipidemia: Secondary | ICD-10-CM | POA: Diagnosis not present

## 2018-10-18 DIAGNOSIS — M48062 Spinal stenosis, lumbar region with neurogenic claudication: Secondary | ICD-10-CM

## 2018-10-18 DIAGNOSIS — I340 Nonrheumatic mitral (valve) insufficiency: Secondary | ICD-10-CM

## 2018-10-18 DIAGNOSIS — M4807 Spinal stenosis, lumbosacral region: Secondary | ICD-10-CM

## 2018-10-18 DIAGNOSIS — I10 Essential (primary) hypertension: Secondary | ICD-10-CM

## 2018-10-18 DIAGNOSIS — E669 Obesity, unspecified: Secondary | ICD-10-CM

## 2018-10-18 NOTE — Assessment & Plan Note (Signed)
Controlled, continue medicine; try to limit salt; working on weight loss; try to follow DASH guidelines

## 2018-10-18 NOTE — Assessment & Plan Note (Signed)
Order MRI, refer to ortho and PT

## 2018-10-18 NOTE — Assessment & Plan Note (Signed)
Order MRI of the lumbar spine; refer to ortho and PT

## 2018-10-18 NOTE — Progress Notes (Signed)
BP 130/82   Pulse 64   Temp 98.4 F (36.9 C) (Oral)   Resp 12   Ht 6' (1.829 m)   Wt 242 lb 1.6 oz (109.8 kg)   SpO2 99%   BMI 32.83 kg/m    Subjective:    [name], male    DOB: 1940/09/28, 78 y.o.   MRN: [id number]  HPI: Jesus Dillon is a 78 y.o. male  Chief Complaint  Patient presents with  . Follow-up  . Sciatica    left side    HPI Patient is here for f/u  He can normally exercise the weight off, but he is having a problem; used to be able to get on the ellipit Catches on the left flank when he tries to get up frm seated position and also when he turns to the left; locks on him; he had an MRI done in 2010 and we reviewed the report together; advanced bilateral facet arthropathy L4-L5, several canal stenosis at L4-L5; having leakage of bladder if he drinks something and heading home in the car, can tell he needs to urinate, goes right to the bathroom, fairly recent; no stool incontinence; no imaging since then; the legs do not feel heavy or weak or tired; does feel some numbness in the front of the thighs; after sitting for a while, goes away  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, he has seen the specialist and that is doing well  Colonoscopy done and that will be done again in 3 years  His right shoulder is bothering her again; the doctor gave him a shot; he tore something in high school, playing football; not as concerned about that; ortho gave him a shot and it worked well for a while, but now it's coming back; he brought in an xray that I reviewed  Hypertension; on ARB and CCB; runs in the family  Lab Results  Component Value Date   CREATININE 1.14 06/06/2018   High cholesterol; on atorvastatin; not too many eggs in a week, 2-3 every 2 weeks; not much cheese, mostly shredded over salad; recently cut most of the fatty pig meat out; mainly [place] and sliced and loaf white meat, fish too; high chol runs in the family  Lab Results  Component Value  Date   CHOL 137 06/06/2018   HDL 44 06/06/2018   LDLCALC 78 06/06/2018   TRIG 72 06/06/2018   CHOLHDL 3.1 06/06/2018     Depression screen [facility] 2/9 10/18/2018 10/18/2018 06/24/2018 06/09/2018 06/06/2018  Decreased Interest 0 0 0 0 0  Down, Depressed, Hopeless 0 0 0 0 0  PHQ - 2 Score 0 0 0 0 0  Altered sleeping 0 0 0 0 1  Tired, decreased energy 0 0 0 0 1  Change in appetite 0 0 0 0 0  Feeling bad or failure about yourself  0 0 0 0 0  Trouble concentrating 0 0 0 0 0  Moving slowly or fidgety/restless 0 0 0 0 0  Suicidal thoughts 0 0 0 0 0  PHQ-9 Score 0 0 0 0 2  Difficult doing work/chores Not difficult at all - Not difficult at all Not difficult at all Not difficult at all   Fall Risk  10/18/2018 06/24/2018 06/09/2018 06/06/2018 01/14/2018  Falls in the past year? 0 No No No No  Number falls in past yr: 0 - - - -  Injury with Fall? 0 - - - -  Risk for fall  due to : - - Impaired vision - -  Risk for fall due to: Comment - - wears eyeglasses - -    Relevant past medical, surgical, family and social history reviewed Past Medical History:  Diagnosis Date  . Arthritis   . Diastolic dysfunction 03/06/736   Grade 1, echo Sept 2019  . GERD (gastroesophageal reflux disease)   . Gout   . HBP (high blood pressure)   . Heart murmur   . Hyperlipidemia   . 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 Mitral regurgitation 06/17/2018   Echo Sept 2019  . Pulmonary regurgitation 06/17/2018   Echo Sept 2019  . Tremors of nervous system    Past Surgical History:  Procedure Laterality Date  . ANAL FISSURECTOMY    . COLONOSCOPY WITH PROPOFOL N/A 08/25/2018   Procedure: COLONOSCOPY WITH PROPOFOL;  Surgeon: Jonathon Bellows, MD;  Location: [facility] ENDOSCOPY;  Service: Gastroenterology;  Laterality: N/A;  . HYDROCELE EXCISION Left 03/01/2018   Procedure: HYDROCELECTOMY ADULT;  Surgeon: Abbie Sons, MD;  Location: [facility] ORS;  Service: Urology;  Laterality: Left;   Family History  Problem  Relation Age of Onset  . Hypertension Mother   . Dementia Mother   . Angina Mother   . Diabetes Brother   . Hypertension Brother   . Blindness Maternal Grandmother    Social History   Tobacco Use  . Smoking status: Never Smoker  . Smokeless tobacco: Never Used  . Tobacco comment: smoking cessation materials not required  Substance Use Topics  . Alcohol use: No    Alcohol/week: 0.0 standard drinks  . Drug use: No     Office Visit from 10/18/2018 in [facility]  AUDIT-C Score  0      Interim medical history since last visit reviewed. Allergies and medications reviewed  Review of Systems Per HPI unless specifically indicated above     Objective:    BP 130/82   Pulse 64   Temp 98.4 F (36.9 C) (Oral)   Resp 12   Ht 6' (1.829 m)   Wt 242 lb 1.6 oz (109.8 kg)   SpO2 99%   BMI 32.83 kg/m   Wt Readings from Last 3 Encounters:  10/18/18 242 lb 1.6 oz (109.8 kg)  08/25/18 240 lb (108.9 kg)  08/02/18 240 lb 11.2 oz (109.2 kg)    Physical Exam Constitutional:      General: He is not in acute distress.    Appearance: He is well-developed. He is obese.  HENT:     Head: Normocephalic and atraumatic.  Eyes:     General: No scleral icterus. Neck:     Thyroid: No thyromegaly.  Cardiovascular:     Rate and Rhythm: Normal rate and regular rhythm.  Pulmonary:     Effort: Pulmonary effort is normal.     Breath sounds: Normal breath sounds.  Abdominal:     General: Bowel sounds are normal. There is no distension.     Palpations: Abdomen is soft.  Musculoskeletal:     Lumbar back: He exhibits decreased range of motion and tenderness. He exhibits no deformity.  Skin:    General: Skin is warm and dry.     Coloration: Skin is not pale.  Neurological:     Mental Status: He is alert.     Motor: No weakness or abnormal muscle tone.     Coordination: Coordination normal.     Comments: LE strength 5/5  Psychiatric:  Mood and Affect: Mood normal.         Behavior: Behavior normal.        Cognition and Memory: Cognition normal.       Assessment & Plan:   Problem List Items Addressed This Visit      Cardiovascular and Mediastinum   Mitral regurgitation    Seen by heart doctor; goes back in April      HBP (high blood pressure) (Chronic)    Controlled, continue medicine; try to limit salt; working on weight loss; try to follow DASH guidelines        Musculoskeletal and Integument   Facet arthritis of lumbar region    Order MRI of the lumbar spine; refer to ortho and PT      Relevant Orders   Ambulatory referral to Orthopedics   Ambulatory referral to Physical Therapy     Other   Lumbar canal stenosis    Order MRI, refer to ortho and PT      Relevant Orders   Ambulatory referral to Orthopedics   Ambulatory referral to Physical Therapy   Hyperlipidemia (Chronic)    Limit saturated fats      Obesity (BMI 30.0-34.9)    Encouraged weight losss ee AVS       Other Visit Diagnoses    Spinal stenosis of lumbosacral region       Relevant Orders   MR Lumbar Spine Wo Contrast   Ambulatory referral to Orthopedics   Ambulatory referral to Physical Therapy       Follow up plan: Return in about 8 weeks (around 12/12/2018) for follow-up visit with Dr. Sanda Klein (or just after), fasting labs then.  An after-visit summary was printed and given to the patient at [facility].  Please see the patient instructions which may contain other information and recommendations beyond what is mentioned above in the assessment and plan.  No orders of the defined types were placed in this encounter.   Orders Placed This Encounter  Procedures  . MR Lumbar Spine Wo Contrast  . Ambulatory referral to Orthopedics  . Ambulatory referral to Physical Therapy

## 2018-10-18 NOTE — Assessment & Plan Note (Signed)
Limit saturated fats 

## 2018-10-18 NOTE — Patient Instructions (Addendum)
Check out the information at familydoctor.org entitled "Nutrition for Weight Loss: What You Need to Know about Fad Diets" Try to lose between 1-2 pounds per week by taking in fewer calories and burning off more calories You can succeed by limiting portions, limiting foods dense in calories and fat, becoming more active, and drinking 8 glasses of water a day (64 ounces) Don't skip meals, especially breakfast, as skipping meals may alter your metabolism Do not use over-the-counter weight loss pills or gimmicks that claim rapid weight loss A healthy BMI (or body mass index) is between 18.5 and 24.9 You can calculate your ideal BMI at the NIH website http://www.nhlbi.nih.gov/health/educational/lose_wt/BMI/bmicalc.htm  Obesity, Adult Obesity is the condition of having too much total body fat. Being overweight or obese means that your weight is greater than what is considered healthy for your body size. Obesity is determined by a measurement called BMI. BMI is an estimate of body fat and is calculated from height and weight. For adults, a BMI of 30 or higher is considered obese. Obesity can eventually lead to other health concerns and major illnesses, including:  Stroke.  Coronary artery disease (CAD).  Type 2 diabetes.  Some types of cancer, including cancers of the colon, breast, uterus, and gallbladder.  Osteoarthritis.  High blood pressure (hypertension).  High cholesterol.  Sleep apnea.  Gallbladder stones.  Infertility problems. What are the causes? The main cause of obesity is taking in (consuming) more calories than your body uses for energy. Other factors that contribute to this condition may include:  Being born with genes that make you more likely to become obese.  Having a medical condition that causes obesity. These conditions include: ? Hypothyroidism. ? Polycystic ovarian syndrome (PCOS). ? Binge-eating disorder. ? Cushing syndrome.  Taking certain medicines, such  as steroids, antidepressants, and seizure medicines.  Not being physically active (sedentary lifestyle).  Living where there are limited places to exercise safely or buy healthy foods.  Not getting enough sleep. What increases the risk? The following factors may increase your risk of this condition:  Having a family history of obesity.  Being a woman of African-American descent.  Being a man of Hispanic descent. What are the signs or symptoms? Having excessive body fat is the main symptom of this condition. How is this diagnosed? This condition may be diagnosed based on:  Your symptoms.  Your medical history.  A physical exam. Your health care provider may measure: ? Your BMI. If you are an adult with a BMI between 25 and less than 30, you are considered overweight. If you are an adult with a BMI of 30 or higher, you are considered obese. ? The distances around your hips and your waist (circumferences). These may be compared to each other to help diagnose your condition. ? Your skinfold thickness. Your health care provider may gently pinch a fold of your skin and measure it. How is this treated? Treatment for this condition often includes changing your lifestyle. Treatment may include some or all of the following:  Dietary changes. Work with your health care provider and a dietitian to set a weight-loss goal that is healthy and reasonable for you. Dietary changes may include eating: ? Smaller portions. A portion size is the amount of a particular food that is healthy for you to eat at one time. This varies from person to person. ? Low-calorie or low-fat options. ? More whole grains, fruits, and vegetables.  Regular physical activity. This may include aerobic activity (cardio) and   strength training.  Medicine to help you lose weight. Your health care provider may prescribe medicine if you are unable to lose 1 pound a week after 6 weeks of eating more healthily and doing more  physical activity.  Surgery. Surgical options may include gastric banding and gastric bypass. Surgery may be done if: ? Other treatments have not helped to improve your condition. ? You have a BMI of 40 or higher. ? You have life-threatening health problems related to obesity. Follow these instructions at home:  Eating and drinking   Follow recommendations from your health care provider about what you eat and drink. Your health care provider may advise you to: ? Limit fast foods, sweets, and processed snack foods. ? Choose low-fat options, such as low-fat milk instead of whole milk. ? Eat 5 or more servings of fruits or vegetables every day. ? Eat at home more often. This gives you more control over what you eat. ? Choose healthy foods when you eat out. ? Learn what a healthy portion size is. ? Keep low-fat snacks on hand. ? Avoid sugary drinks, such as soda, fruit juice, iced tea sweetened with sugar, and flavored milk. ? Eat a healthy breakfast.  Drink enough water to keep your urine clear or pale yellow.  Do not go without eating for long periods of time (do not fast) or follow a fad diet. Fasting and fad diets can be unhealthy and even dangerous. Physical Activity  Exercise regularly, as told by your health care provider. Ask your health care provider what types of exercise are safe for you and how often you should exercise.  Warm up and stretch before being active.  Cool down and stretch after being active.  Rest between periods of activity. Lifestyle  Limit the time that you spend in front of your TV, computer, or video game system.  Find ways to reward yourself that do not involve food.  Limit alcohol intake to no more than 1 drink a day for nonpregnant women and 2 drinks a day for men. One drink equals 12 oz of beer, 5 oz of wine, or 1 oz of hard liquor. General instructions  Keep a weight loss journal to keep track of the food you eat and how much you exercise you  get.  Take over-the-counter and prescription medicines only as told by your health care provider.  Take vitamins and supplements only as told by your health care provider.  Consider joining a support group. Your health care provider may be able to recommend a support group.  Keep all follow-up visits as told by your health care provider. This is important. Contact a health care provider if:  You are unable to meet your weight loss goal after 6 weeks of dietary and lifestyle changes. This information is not intended to replace advice given to you by your health care provider. Make sure you discuss any questions you have with your health care provider. Document Released: 10/15/2004 Document Revised: 02/10/2016 Document Reviewed: 06/26/2015 Elsevier Interactive Patient Education  2019 Elsevier Inc.  Preventing Unhealthy Weight Gain, Adult Staying at a healthy weight is important to your overall health. When fat builds up in your body, you may become overweight or obese. Being overweight or obese increases your risk of developing certain health problems, such as heart disease, diabetes, sleeping problems, joint problems, and some types of cancer. Unhealthy weight gain is often the result of making unhealthy food choices or not getting enough exercise. You can make changes   to your lifestyle to prevent obesity and stay as healthy as possible. What nutrition changes can be made?   Eat only as much as your body needs. To do this: ? Pay attention to signs that you are hungry or full. Stop eating as soon as you feel full. ? If you feel hungry, try drinking water first before eating. Drink enough water so your urine is clear or pale yellow. ? Eat smaller portions. Pay attention to portion sizes when eating out. ? Look at serving sizes on food labels. Most foods contain more than one serving per container. ? Eat the recommended number of calories for your gender and activity level. For most active  people, a daily total of 2,000 calories is appropriate. If you are trying to lose weight or are not very active, you may need to eat fewer calories. Talk with your health care provider or a diet and nutrition specialist (dietitian) about how many calories you need each day.  Choose healthy foods, such as: ? Fruits and vegetables. At each meal, try to fill at least half of your plate with fruits and vegetables. ? Whole grains, such as whole-wheat bread, brown rice, and quinoa. ? Lean meats, such as chicken or fish. ? Other healthy proteins, such as beans, eggs, or tofu. ? Healthy fats, such as nuts, seeds, fatty fish, and olive oil. ? Low-fat or fat-free dairy products.  Check food labels, and avoid food and drinks that: ? Are high in calories. ? Have added sugar. ? Are high in sodium. ? Have saturated fats or trans fats.  Cook foods in healthier ways, such as by baking, broiling, or grilling.  Make a meal plan for the week, and shop with a grocery list to help you stay on track with your purchases. Try to avoid going to the grocery store when you are hungry.  When grocery shopping, try to shop around the outside of the store first, where the fresh foods are. Doing this helps you to avoid prepackaged foods, which can be high in sugar, salt (sodium), and fat. What lifestyle changes can be made?   Exercise for 30 or more minutes on 5 or more days each week. Exercising may include brisk walking, yard work, biking, running, swimming, and team sports like basketball and soccer. Ask your health care provider which exercises are safe for you.  Do muscle-strengthening activities, such as lifting weights or using resistance bands, on 2 or more days a week.  Do not use any products that contain nicotine or tobacco, such as cigarettes and e-cigarettes. If you need help quitting, ask your health care provider.  Limit alcohol intake to no more than 1 drink a day for nonpregnant women and 2 drinks a  day for men. One drink equals 12 oz of beer, 5 oz of wine, or 1 oz of hard liquor.  Try to get 7-9 hours of sleep each night. What other changes can be made?  Keep a food and activity journal to keep track of: ? What you ate and how many calories you had. Remember to count the calories in sauces, dressings, and side dishes. ? Whether you were active, and what exercises you did. ? Your calorie, weight, and activity goals.  Check your weight regularly. Track any changes. If you notice you have gained weight, make changes to your diet or activity routine.  Avoid taking weight-loss medicines or supplements. Talk to your health care provider before starting any new medicine or supplement.  Talk   to your health care provider before trying any new diet or exercise plan. Why are these changes important? Eating healthy, staying active, and having healthy habits can help you to prevent obesity. Those changes also:  Help you manage stress and emotions.  Help you connect with friends and family.  Improve your self-esteem.  Improve your sleep.  Prevent long-term health problems. What can happen if changes are not made? Being obese or overweight can cause you to develop joint or bone problems, which can make it hard for you to stay active or do activities you enjoy. Being obese or overweight also puts stress on your heart and lungs and can lead to health problems like diabetes, heart disease, and some cancers. Where to find more information Talk with your health care provider or a dietitian about healthy eating and healthy lifestyle choices. You may also find information from:  U.S. Department of Agriculture, MyPlate: www.choosemyplate.gov  American Heart Association: www.heart.org  Centers for Disease Control and Prevention: www.cdc.gov Summary  Staying at a healthy weight is important to your overall health. It helps you to prevent certain diseases and health problems, such as heart  disease, diabetes, joint problems, sleep disorders, and some types of cancer.  Being obese or overweight can cause you to develop joint or bone problems, which can make it hard for you to stay active or do activities you enjoy.  You can prevent unhealthy weight gain by eating a healthy diet, exercising regularly, not smoking, limiting alcohol, and getting enough sleep.  Talk with your health care provider or a dietitian for guidance about healthy eating and healthy lifestyle choices. This information is not intended to replace advice given to you by your health care provider. Make sure you discuss any questions you have with your health care provider. Document Released: 09/08/2016 Document Revised: 06/18/2017 Document Reviewed: 10/14/2016 Elsevier Interactive Patient Education  2019 Elsevier Inc.  

## 2018-10-18 NOTE — Assessment & Plan Note (Signed)
Seen by heart doctor; goes back in April

## 2018-10-19 NOTE — Assessment & Plan Note (Signed)
Encouraged weight loss; see AVS 

## 2018-11-01 ENCOUNTER — Ambulatory Visit: Admission: RE | Admit: 2018-11-01 | Payer: Medicare Other | Source: Ambulatory Visit

## 2018-11-10 ENCOUNTER — Ambulatory Visit: Payer: Medicare Other

## 2018-11-17 ENCOUNTER — Ambulatory Visit
Admission: RE | Admit: 2018-11-17 | Discharge: 2018-11-17 | Disposition: A | Discharge status: Home / Self Care | Payer: Medicare Other | Source: Ambulatory Visit | Attending: Family Medicine | Admitting: Family Medicine

## 2018-11-17 DIAGNOSIS — M4807 Spinal stenosis, lumbosacral region: Secondary | ICD-10-CM | POA: Diagnosis present

## 2018-11-18 ENCOUNTER — Encounter: Payer: Self-pay | Admitting: Family Medicine

## 2018-11-18 DIAGNOSIS — M5136 Other intervertebral disc degeneration, lumbar region: Secondary | ICD-10-CM

## 2018-11-18 DIAGNOSIS — M5126 Other intervertebral disc displacement, lumbar region: Secondary | ICD-10-CM

## 2018-11-18 DIAGNOSIS — M47816 Spondylosis without myelopathy or radiculopathy, lumbar region: Secondary | ICD-10-CM

## 2018-11-18 DIAGNOSIS — M48062 Spinal stenosis, lumbar region with neurogenic claudication: Secondary | ICD-10-CM

## 2018-11-18 DIAGNOSIS — M48061 Spinal stenosis, lumbar region without neurogenic claudication: Secondary | ICD-10-CM

## 2018-12-13 ENCOUNTER — Ambulatory Visit: Payer: Medicare Other | Admitting: Family Medicine

## 2018-12-27 ENCOUNTER — Ambulatory Visit: Payer: Medicare Other | Admitting: Cardiovascular Disease

## 2019-01-05 ENCOUNTER — Telehealth: Payer: Self-pay

## 2019-01-05 NOTE — Telephone Encounter (Signed)
Called patient.  No answer. LMOV. Need to change appointment to an evisit.

## 2019-01-10 ENCOUNTER — Ambulatory Visit: Payer: Medicare Other | Admitting: Family Medicine

## 2019-01-12 ENCOUNTER — Ambulatory Visit: Payer: Medicare Other | Admitting: Family Medicine

## 2019-01-24 ENCOUNTER — Ambulatory Visit: Payer: Medicare Other | Admitting: Cardiovascular Disease

## 2019-01-31 ENCOUNTER — Ambulatory Visit: Payer: Medicare Other | Admitting: Urology

## 2019-02-01 ENCOUNTER — Other Ambulatory Visit: Payer: Self-pay | Admitting: Family Medicine

## 2019-02-02 ENCOUNTER — Telehealth: Payer: Self-pay

## 2019-02-02 NOTE — Telephone Encounter (Signed)
Spoke with patient to schedule overdue F/U with Dr. Rockey Situ. Offered telephone or video visit due to current clinic policies related to COVID 19 precautions. He states he spoke with our office a few weeks ago but forgot to call back to schedule. Patient states he and his wife would both like to schedule appointments for next month so he will call back later this afternoon when she is available to do so.

## 2019-02-14 IMAGING — US US SCROTUM W/ DOPPLER COMPLETE
1 series · 14 of 25 positions shown · non-contrast
Comparison: None.

CLINICAL DATA: Scrotal swelling

EXAM:
SCROTAL ULTRASOUND
DOPPLER ULTRASOUND OF THE TESTICLES
TECHNIQUE: Complete ultrasound examination of the testicles, epididymis, and
other scrotal structures was performed. Color and spectral Doppler
ultrasound were also utilized to evaluate blood flow to the
testicles.

[Series 1: us scrotum w/ doppler complete · 14 of 67 slices shown]
[im 1/67]
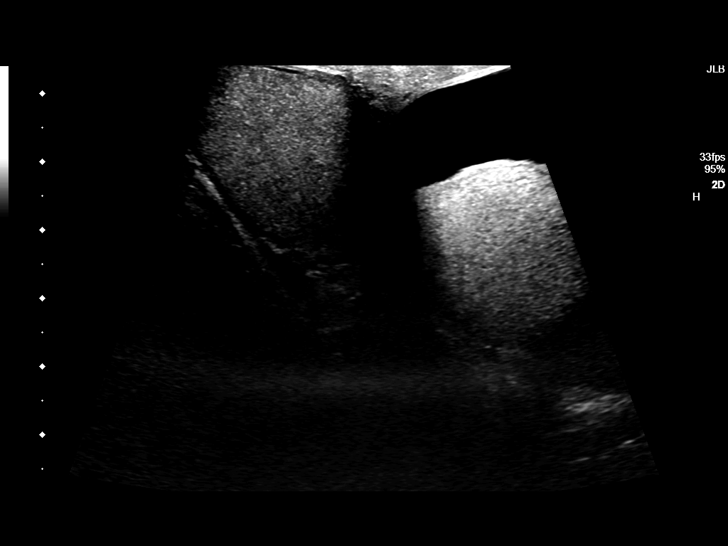
[im 6/67]
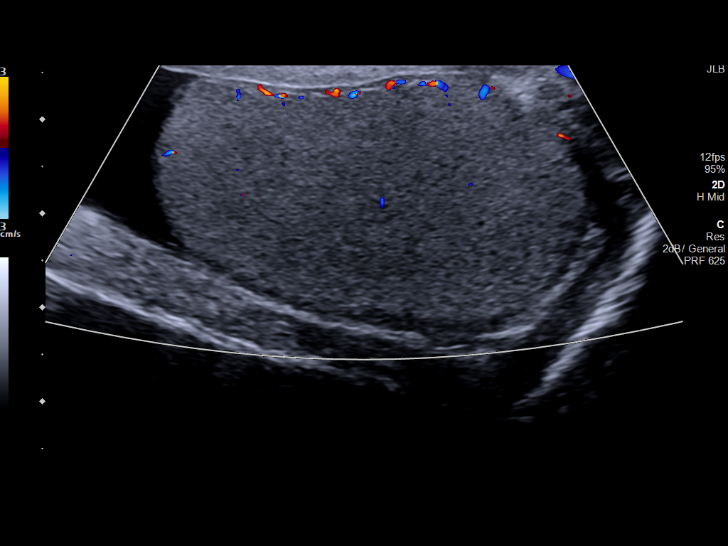
[im 12/67]
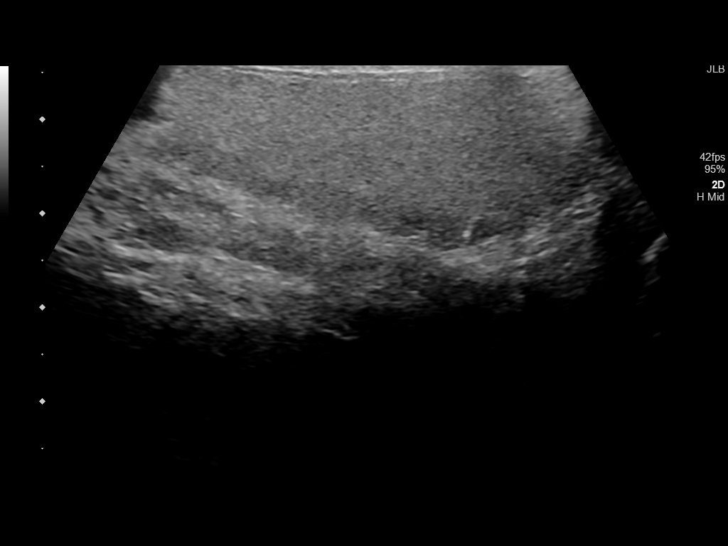
[im 17/67]
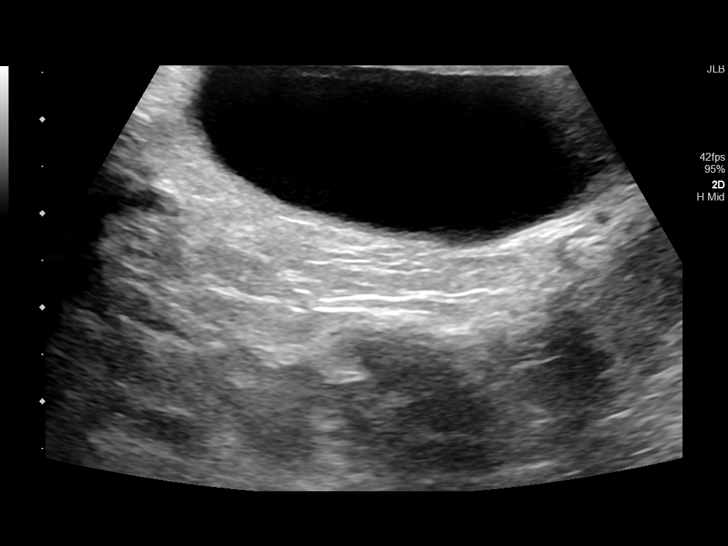
[im 23/67]
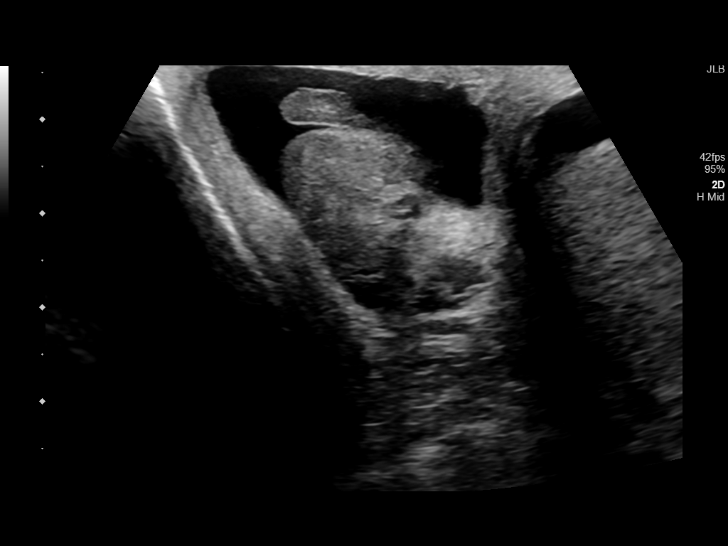
[im 25/67]
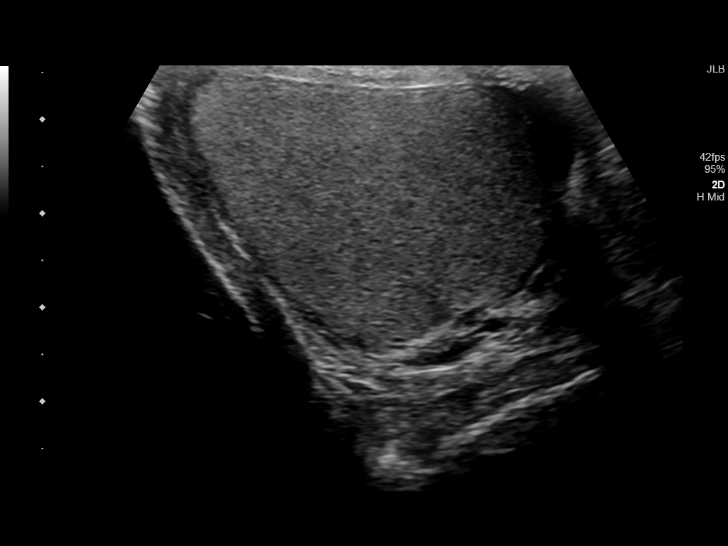
[im 31/67]
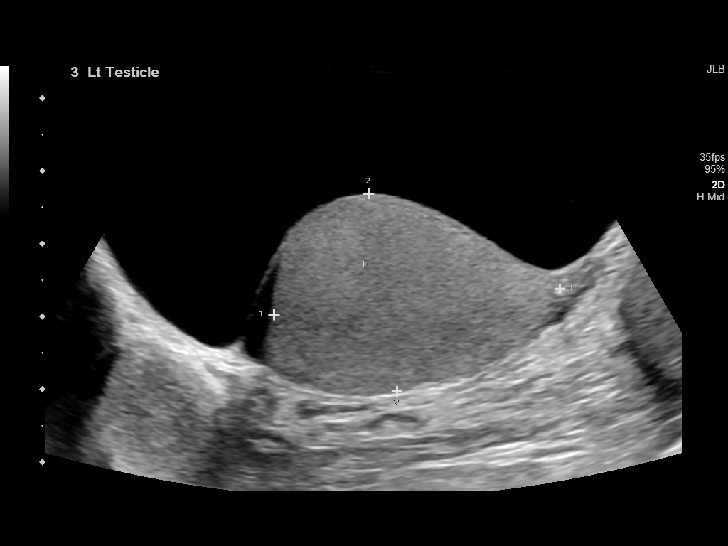
[im 36/67]
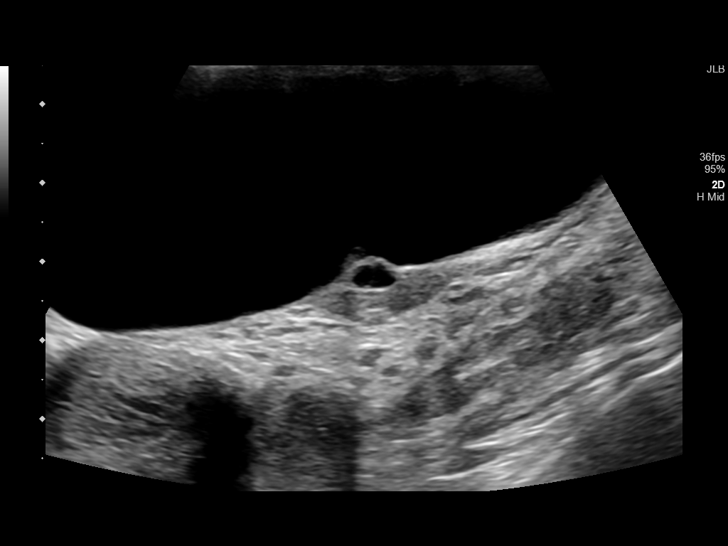
[im 42/67]
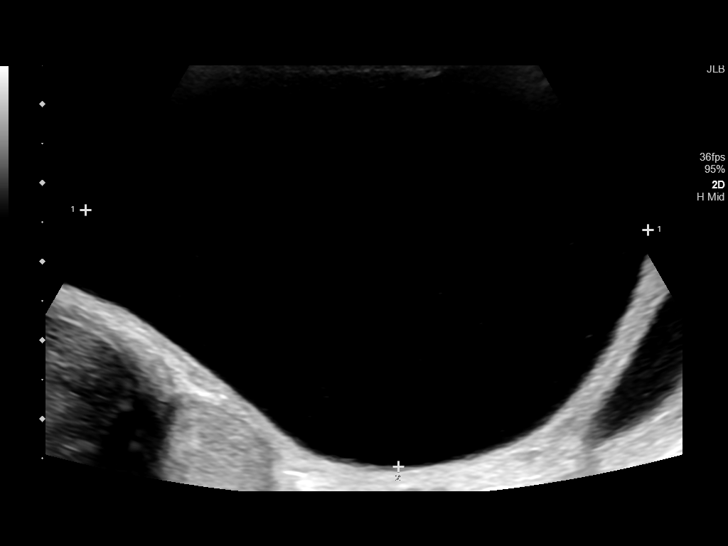
[im 45/67]
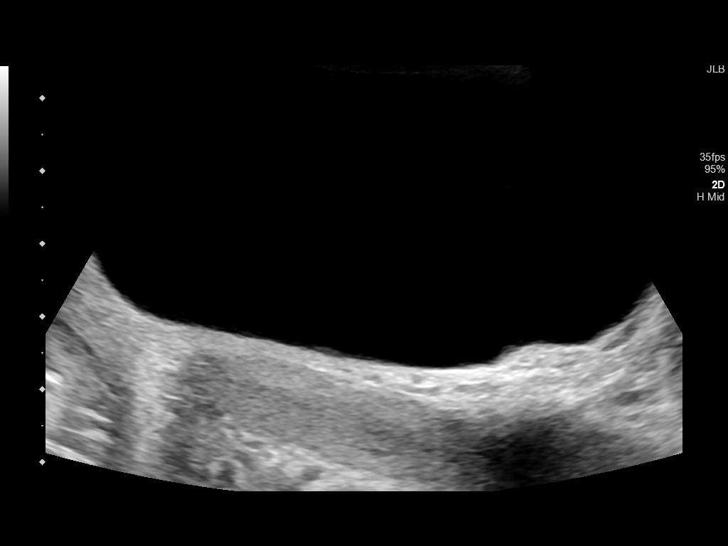
[im 50/67]
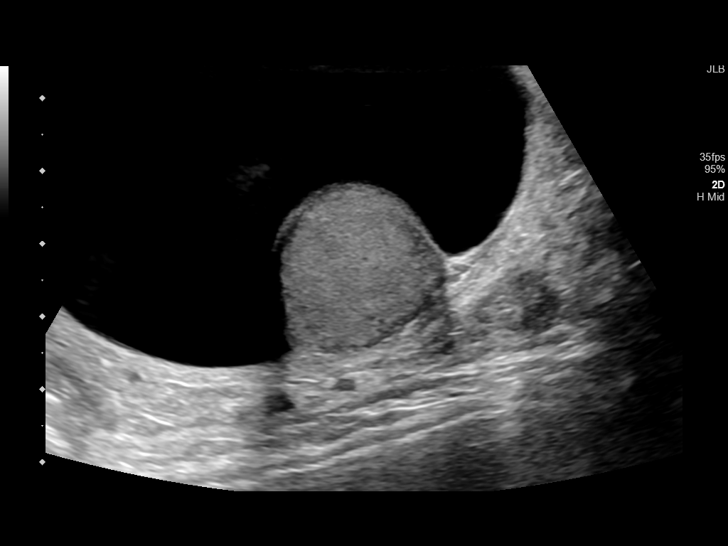
[im 56/67]
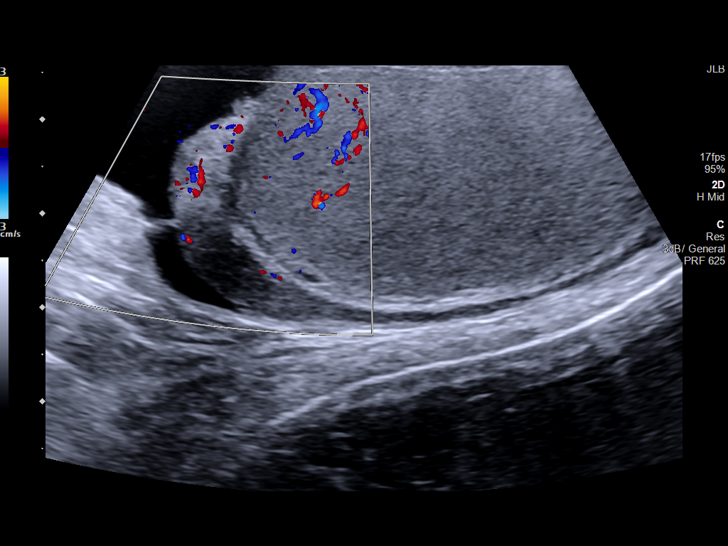
[im 61/67]
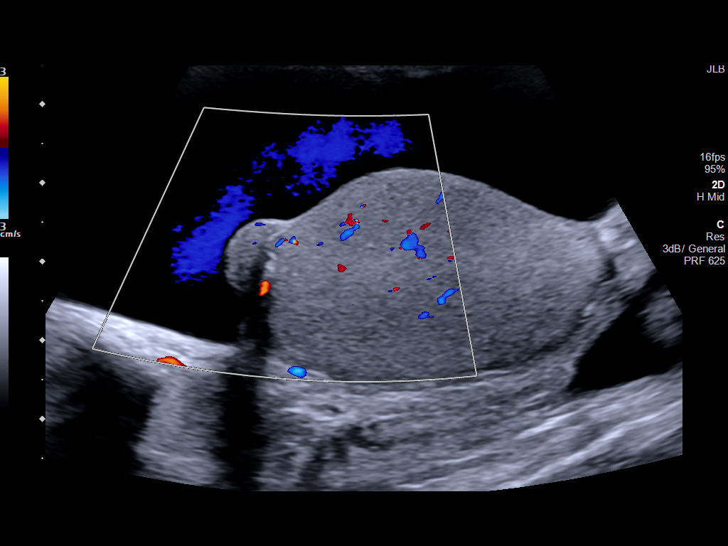
[im 67/67]
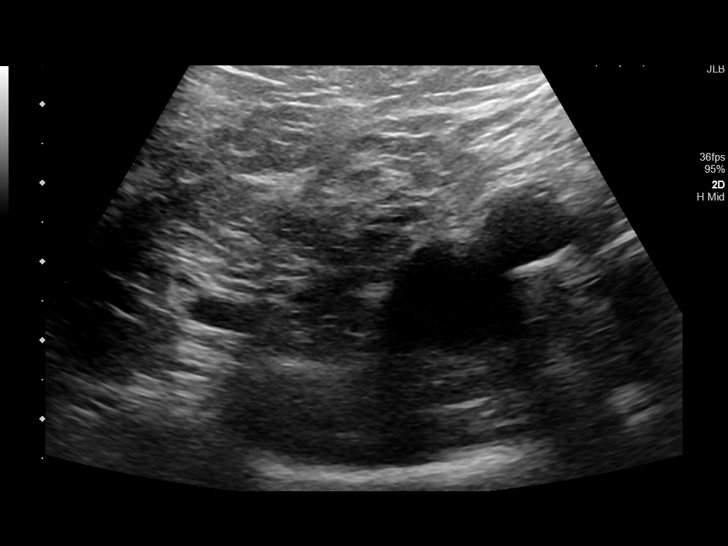

[14 of 25 positions shown; findings below may reference images not displayed]

FINDINGS: Right testicle

Measurements: 4.6 x 2.8 x 4.0 cm. No mass or microlithiasis
visualized.

Left testicle

Measurements: 3.9 x 2.7 x 2.4 cm. No mass or microlithiasis
visualized.

Right epididymis:  Normal in size and appearance.

Left epididymis:  Normal in size and appearance.

Hydrocele:  Moderate right hydrocele and large left hydrocele.

Varicocele:  None visualized.

Pulsed Doppler interrogation of both testes demonstrates normal low
resistance arterial and venous waveforms bilaterally.
IMPRESSION: No testicular abnormality or evidence of torsion.

Moderate right hydrocele and large left hydrocele.

## 2019-02-20 ENCOUNTER — Other Ambulatory Visit: Payer: Self-pay | Admitting: Family Medicine

## 2019-02-21 ENCOUNTER — Telehealth: Payer: Self-pay | Admitting: Nurse Practitioner

## 2019-02-21 ENCOUNTER — Other Ambulatory Visit: Payer: Self-pay

## 2019-02-21 ENCOUNTER — Ambulatory Visit: Payer: Medicare Other | Admitting: Nurse Practitioner

## 2019-02-21 ENCOUNTER — Encounter: Payer: Self-pay | Admitting: Nurse Practitioner

## 2019-02-21 ENCOUNTER — Ambulatory Visit: Payer: Medicare Other | Admitting: Family Medicine

## 2019-02-21 VITALS — BP 124/78 | HR 63 | Temp 98.7°F | Ht 72.0 in | Wt 239.5 lb

## 2019-02-21 DIAGNOSIS — M1A4710 Other secondary chronic gout, right ankle and foot, without tophus (tophi): Secondary | ICD-10-CM

## 2019-02-21 DIAGNOSIS — R739 Hyperglycemia, unspecified: Secondary | ICD-10-CM

## 2019-02-21 DIAGNOSIS — R251 Tremor, unspecified: Secondary | ICD-10-CM

## 2019-02-21 DIAGNOSIS — I10 Essential (primary) hypertension: Secondary | ICD-10-CM | POA: Diagnosis not present

## 2019-02-21 DIAGNOSIS — E782 Mixed hyperlipidemia: Secondary | ICD-10-CM | POA: Diagnosis not present

## 2019-02-21 DIAGNOSIS — M5441 Lumbago with sciatica, right side: Secondary | ICD-10-CM

## 2019-02-21 DIAGNOSIS — G8929 Other chronic pain: Secondary | ICD-10-CM

## 2019-02-21 MED ORDER — GABAPENTIN 300 MG PO CAPS: 300.0000 mg | ORAL_CAPSULE | Freq: Three times a day (TID) | ORAL | 1 refills | Status: DC

## 2019-02-21 MED ORDER — TELMISARTAN 80 MG PO TABS: 80.0000 mg | ORAL_TABLET | Freq: Every day | ORAL | 1 refills | Status: DC

## 2019-02-21 MED ORDER — AMLODIPINE BESYLATE 5 MG PO TABS: 5.0000 mg | ORAL_TABLET | Freq: Every day | ORAL | 1 refills | Status: DC

## 2019-02-21 MED ORDER — PROPRANOLOL HCL 10 MG PO TABS: 10.0000 mg | ORAL_TABLET | Freq: Three times a day (TID) | ORAL | 1 refills | Status: DC

## 2019-02-21 NOTE — Telephone Encounter (Signed)
error 

## 2019-02-21 NOTE — Progress Notes (Signed)
Name: Jesus Dillon   MRN: [id number]    DOB: [birth year]   Date:02/21/2019       Progress Note  Subjective  Chief Complaint  Chief Complaint  Patient presents with  . Follow-up    HPI  Hypertension Patient takes amlodipine 5mg  daily, telmisartan 80mg  daily and propranolol 10mg  TID No missed doses or issues with medicine Compliant with DASH diet and has much improved. Used to do a lot of running but stopped due to torn meniscus but plans to get back to elliptical.  BP Readings from Last 3 Encounters:  02/21/19 124/78  10/18/18 130/82  08/25/18 (!) 126/58    Hyperlipidemia  Patient takes atorvastatin 40mg  daily Lab Results  Component Value Date   CHOL 137 06/06/2018   HDL 44 06/06/2018   LDLCALC 78 06/06/2018   TRIG 72 06/06/2018   CHOLHDL 3.1 06/06/2018   Prediabetes Has had improved diet but less exercise.  Lab Results  Component Value Date   HGBA1C 6.1 (H) 06/09/2018    Gout Not currently on medication, was on allopurinol in the past but hasnt had a flare in a long time.   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   PHQ2/9: Depression screen [facility] 2/9 02/21/2019 10/18/2018 10/18/2018 06/24/2018 06/09/2018  Decreased Interest 0 0 0 0 0  Down, Depressed, Hopeless 0 0 0 0 0  PHQ - 2 Score 0 0 0 0 0  Altered sleeping 0 0 0 0 0  Tired, decreased energy 0 0 0 0 0  Change in appetite 0 0 0 0 0  Feeling bad or failure about yourself  0 0 0 0 0  Trouble concentrating 0 0 0 0 0  Moving slowly or fidgety/restless 0 0 0 0 0  Suicidal thoughts 0 0 0 0 0  PHQ-9 Score 0 0 0 0 0  Difficult doing work/chores Not difficult at all Not difficult at all - Not difficult at all Not difficult at all  Some recent data might be hidden    PHQ reviewed. Negative  Patient Active Problem List   Diagnosis Date Noted  . Obesity (BMI 30.0-34.9) 07/04/2018  . Diastolic dysfunction 63/87/5643  . Left atrial dilation  06/17/2018  . Mitral regurgitation 06/17/2018  . 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 Pulmonary regurgitation 06/17/2018  . Medication monitoring encounter 06/06/2018  . Chest pain with moderate risk for cardiac etiology 12/22/2017  . Shortness of breath 12/22/2017  . Lumbar canal stenosis 12/20/2017  . Facet arthritis of lumbar region 12/20/2017  . Abnormal EKG 12/20/2017  . Mass on back 08/27/2016  . 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 Tremors of nervous system 04/03/2016  . Hyperlipidemia 02/27/2016  . Hyperglycemia 02/27/2016  . Cardiac murmur 02/27/2016  . HBP (high blood pressure) 12/25/2015  . Chronic right-sided low back pain with right-sided sciatica 12/25/2015    Past Medical History:  Diagnosis Date  . Arthritis   . Diastolic dysfunction 12/18/5186   Grade 1, echo Sept 2019  . GERD (gastroesophageal reflux disease)   . Gout   . HBP (high blood pressure)   . Heart murmur   . Hyperlipidemia   . 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 Mitral regurgitation 06/17/2018   Echo Sept 2019  . Pulmonary regurgitation 06/17/2018   Echo Sept 2019  . Tremors of nervous system     Past Surgical History:  Procedure Laterality Date  .  ANAL FISSURECTOMY    . COLONOSCOPY WITH PROPOFOL N/A 08/25/2018   Procedure: COLONOSCOPY WITH PROPOFOL;  Surgeon: Jonathon Bellows, MD;  Location: [facility] ENDOSCOPY;  Service: Gastroenterology;  Laterality: N/A;  . HYDROCELE EXCISION Left 03/01/2018   Procedure: HYDROCELECTOMY ADULT;  Surgeon: Abbie Sons, MD;  Location: [facility] ORS;  Service: Urology;  Laterality: Left;    Social History   Tobacco Use  . Smoking status: Never Smoker  . Smokeless tobacco: Never Used  . Tobacco comment: smoking cessation materials not required  Substance Use Topics  . Alcohol use: No    Alcohol/week: 0.0 standard drinks     Current Outpatient Medications:  .  amLODipine (NORVASC) 5 MG tablet, Take 1 tablet (5 mg total) by mouth daily., Disp: 90 tablet, Rfl:  3 .  aspirin EC 81 MG tablet, Take 1 tablet (81 mg total) by mouth daily. No NSAIDs for at least one hour, Disp: , Rfl:  .  atorvastatin (LIPITOR) 40 MG tablet, TAKE 1 TABLET AT BEDTIME, Disp: 90 tablet, Rfl: 3 .  cholecalciferol (VITAMIN D) 1000 units tablet, Take 1,000 Units by mouth daily., Disp: , Rfl:  .  Elastic Bandages & Supports ([place]) MISC, Wear on both legs; put on in the morning, remove at night; do NOT sleep in these, Disp: 2 each, Rfl: 1 .  gabapentin (NEURONTIN) 300 MG capsule, TAKE 1 CAPSULE THREE TIMES A DAY, Disp: 270 capsule, Rfl: 1 .  propranolol (INDERAL) 10 MG tablet, TAKE 1 TABLET THREE TIMES A DAY, Disp: 270 tablet, Rfl: 4 .  telmisartan (MICARDIS) 80 MG tablet, TAKE 1 TABLET DAILY REPLACES LOSARTAN FOR BLOOD PRESSURE, Disp: 90 tablet, Rfl: 0  Allergies  Allergen Reactions  . Penicillins Rash    Has patient had a PCN reaction causing immediate rash, facial/tongue/throat swelling, SOB or lightheadedness with hypotension: no Has patient had a PCN reaction causing severe rash involving mucus membranes or skin necrosis: no Has patient had a PCN reaction that required hospitalization: no Has patient had a PCN reaction occurring within the last 10 years: no If all of the above answers are "NO", then may proceed with Cephalosporin use.     Review of Systems  Constitutional: Negative for chills, fever and malaise/fatigue.  Respiratory: Negative for cough and shortness of breath.   Cardiovascular: Negative for chest pain, palpitations and leg swelling.  Gastrointestinal: Negative for abdominal pain.  Musculoskeletal: Negative for joint pain and myalgias.  Skin: Negative for rash.  Neurological: Negative for dizziness and headaches.  Psychiatric/Behavioral: The patient is not nervous/anxious and does not have insomnia.       No other specific complaints in a complete review of systems (except as listed in HPI above).  Objective  Vitals:    02/21/19 1132  BP: 124/78  Pulse: 63  Temp: 98.7 F (37.1 C)  TempSrc: Oral  SpO2: 99%  Weight: 239 lb 8 oz (108.6 kg)  Height: 6' (1.829 m)  HC: 14" (35.6 cm)     Body mass index is 32.48 kg/m.  Nursing Note and Vital Signs reviewed.  Physical Exam Vitals signs reviewed.  Constitutional:      Appearance: He is well-developed.  HENT:     Head: Normocephalic and atraumatic.  Neck:     Musculoskeletal: Normal range of motion and neck supple.  Cardiovascular:     Heart sounds: Normal heart sounds.  Pulmonary:     Effort: Pulmonary effort is normal.     Breath sounds: Normal breath sounds.  Abdominal:     General: Bowel sounds are normal.     Palpations: Abdomen is soft.     Tenderness: There is no abdominal tenderness.  Musculoskeletal: Normal range of motion.  Skin:    General: Skin is warm and dry.     Capillary Refill: Capillary refill takes less than 2 seconds.  Neurological:     Mental Status: He is alert and oriented to person, place, and time.     GCS: GCS eye subscore is 4. GCS verbal subscore is 5. GCS motor subscore is 6.     Sensory: No sensory deficit.  Psychiatric:        Speech: Speech normal.        Behavior: Behavior normal.        Thought Content: Thought content normal.        Judgment: Judgment normal.       No results found for this or any previous visit (from the past 48 hour(s)).  Assessment & Plan  1. Essential hypertension stable - telmisartan (MICARDIS) 80 MG tablet; Take 1 tablet (80 mg total) by mouth daily.  Dispense: 90 tablet; Refill: 1 - propranolol (INDERAL) 10 MG tablet; Take 1 tablet (10 mg total) by mouth 3 (three) times daily.  Dispense: 180 tablet; Refill: 1 - amLODipine (NORVASC) 5 MG tablet; Take 1 tablet (5 mg total) by mouth daily.  Dispense: 90 tablet; Refill: 1  2. Mixed hyperlipidemia Well controlled   3. Hyperglycemia Improving diet, monitor A1C at follow-up   4. 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 Chronic right-sided low back pain with right-sided sciatica stable - gabapentin (NEURONTIN) 300 MG capsule; Take 1 capsule (300 mg total) by mouth 3 (three) times daily.  Dispense: 270 capsule; Refill: 1  6. Tremor of right hand Resolved but was told by PCP to stay on meds for BP control, discussed going down to two time daily patient would like to stay at current dose.  - propranolol (INDERAL) 10 MG tablet; Take 1 tablet (10 mg total) by mouth 3 (three) times daily.  Dispense: 180 tablet; Refill: 1

## 2019-03-09 ENCOUNTER — Ambulatory Visit: Payer: Medicare Other | Admitting: Urology

## 2019-04-14 ENCOUNTER — Ambulatory Visit: Payer: Medicare Other | Admitting: Urology

## 2019-05-16 ENCOUNTER — Ambulatory Visit: Payer: Medicare Other | Admitting: Urology

## 2019-05-24 ENCOUNTER — Ambulatory Visit (INDEPENDENT_AMBULATORY_CARE_PROVIDER_SITE_OTHER): Payer: Medicare Other | Admitting: Nurse Practitioner

## 2019-05-24 ENCOUNTER — Encounter: Payer: Self-pay | Admitting: Nurse Practitioner

## 2019-05-24 VITALS — BP 121/63 | Resp 14

## 2019-05-24 DIAGNOSIS — M5441 Lumbago with sciatica, right side: Secondary | ICD-10-CM

## 2019-05-24 DIAGNOSIS — Z5181 Encounter for therapeutic drug level monitoring: Secondary | ICD-10-CM

## 2019-05-24 DIAGNOSIS — E782 Mixed hyperlipidemia: Secondary | ICD-10-CM

## 2019-05-24 DIAGNOSIS — E66811 Obesity, class 1: Secondary | ICD-10-CM

## 2019-05-24 DIAGNOSIS — R739 Hyperglycemia, unspecified: Secondary | ICD-10-CM

## 2019-05-24 DIAGNOSIS — M1A4710 Other secondary chronic gout, right ankle and foot, without tophus (tophi): Secondary | ICD-10-CM | POA: Diagnosis not present

## 2019-05-24 DIAGNOSIS — E669 Obesity, unspecified: Secondary | ICD-10-CM

## 2019-05-24 DIAGNOSIS — G8929 Other chronic pain: Secondary | ICD-10-CM

## 2019-05-24 DIAGNOSIS — I10 Essential (primary) hypertension: Secondary | ICD-10-CM

## 2019-05-24 NOTE — Progress Notes (Signed)
Virtual Visit via Video Note  I connected with Jesus Dillon on 05/24/19 at  3:40 PM EDT by a video enabled telemedicine application and verified that I am speaking with the correct person using two identifiers.   Staff discussed the limitations of evaluation and management by telemedicine and the availability of in person appointments. The patient expressed understanding and agreed to proceed.  Patient location: home  My location: work office Other people present: wife- Zaheer Szczesniak HPI  Hypertension Patient takes amlodipine 5mg  daily, telmisartan 80mg  daily and propranolol 10mg  TID No missed doses or issues with medicine Compliant with DASH diet and has much improved. -Used to do a lot of running but stopped due to torn meniscus - knee is doing well, he is doing 30 minutes or so on the elliptical.  BP Readings from Last 3 Encounters:  05/24/19 121/63  02/21/19 124/78  10/18/18 130/82    Hyperlipidemia  Patient takes atorvastatin 40mg  daily Denies myalgias Lab Results  Component Value Date   CHOL 137 06/06/2018   HDL 44 06/06/2018   LDLCALC 78 06/06/2018   TRIG 72 06/06/2018   CHOLHDL 3.1 06/06/2018    Prediabetes Diet: states he is eating better, states he is more aware that he cannot work out all of his calories so he has really cut down salt and is eating more salads.  Lab Results  Component Value Date   HGBA1C 6.1 (H) 06/09/2018    Gout Not currently on medication, was on allopurinol in the past but hasnt had a flare in a long time.   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   PHQ2/9: Depression screen [facility] 2/9 05/24/2019 02/21/2019 10/18/2018 10/18/2018 06/24/2018  Decreased Interest 0 0 0 0 0  Down, Depressed, Hopeless 0 0 0 0 0  PHQ - 2 Score 0 0 0 0 0  Altered sleeping 0 0 0 0 0  Tired, decreased energy 0 0 0 0 0  Change in appetite 0 0 0 0 0  Feeling bad or failure about yourself  0 0 0  0 0  Trouble concentrating 0 0 0 0 0  Moving slowly or fidgety/restless 0 0 0 0 0  Suicidal thoughts 0 0 0 0 0  PHQ-9 Score 0 0 0 0 0  Difficult doing work/chores Not difficult at all Not difficult at all Not difficult at all - Not difficult at all  Some recent data might be hidden     PHQ reviewed. Negative  Patient Active Problem List   Diagnosis Date Noted  . Obesity (BMI 30.0-34.9) 07/04/2018  . Diastolic dysfunction 123456  . Left atrial dilation 06/17/2018  . Mitral regurgitation 06/17/2018  . 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 Pulmonary regurgitation 06/17/2018  . Medication monitoring encounter 06/06/2018  . Lumbar canal stenosis 12/20/2017  . Abnormal EKG 12/20/2017  . 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 Tremors of nervous system 04/03/2016  . Hyperlipidemia 02/27/2016  . Hyperglycemia 02/27/2016  . Cardiac murmur 02/27/2016  . HBP (high blood pressure) 12/25/2015  . Chronic right-sided low back pain with right-sided sciatica 12/25/2015    Past Medical History:  Diagnosis Date  . Arthritis   . Diastolic dysfunction 0000000   Grade 1, echo Sept 2019  . GERD (gastroesophageal reflux disease)   . Gout   . HBP (high blood pressure)   . Heart murmur   . Hyperlipidemia   . Left atrial dilation 06/17/2018  Echo Sept 2019  . Mitral regurgitation 06/17/2018   Echo Sept 2019  . Pulmonary regurgitation 06/17/2018   Echo Sept 2019  . Tremors of nervous system     Past Surgical History:  Procedure Laterality Date  . ANAL FISSURECTOMY    . COLONOSCOPY WITH PROPOFOL N/A 08/25/2018   Procedure: COLONOSCOPY WITH PROPOFOL;  Surgeon: Jonathon Bellows, MD;  Location: [facility] ENDOSCOPY;  Service: Gastroenterology;  Laterality: N/A;  . HYDROCELE EXCISION Left 03/01/2018   Procedure: HYDROCELECTOMY ADULT;  Surgeon: Abbie Sons, MD;  Location: [facility] ORS;  Service: Urology;  Laterality: Left;    Social History   Tobacco Use  . Smoking status: Never Smoker  . Smokeless tobacco:  Never Used  . Tobacco comment: smoking cessation materials not required  Substance Use Topics  . Alcohol use: No    Alcohol/week: 0.0 standard drinks     Current Outpatient Medications:  .  amLODipine (NORVASC) 5 MG tablet, Take 1 tablet (5 mg total) by mouth daily., Disp: 90 tablet, Rfl: 1 .  aspirin EC 81 MG tablet, Take 1 tablet (81 mg total) by mouth daily. No NSAIDs for at least one hour, Disp: , Rfl:  .  atorvastatin (LIPITOR) 40 MG tablet, TAKE 1 TABLET AT BEDTIME, Disp: 90 tablet, Rfl: 3 .  cholecalciferol (VITAMIN D) 1000 units tablet, Take 1,000 Units by mouth daily., Disp: , Rfl:  .  gabapentin (NEURONTIN) 300 MG capsule, Take 1 capsule (300 mg total) by mouth 3 (three) times daily., Disp: 270 capsule, Rfl: 1 .  propranolol (INDERAL) 10 MG tablet, Take 1 tablet (10 mg total) by mouth 3 (three) times daily., Disp: 180 tablet, Rfl: 1 .  telmisartan (MICARDIS) 80 MG tablet, Take 1 tablet (80 mg total) by mouth daily., Disp: 90 tablet, Rfl: 1 .  Elastic Bandages & Supports ([place]) MISC, Wear on both legs; put on in the morning, remove at night; do NOT sleep in these, Disp: 2 each, Rfl: 1  Allergies  Allergen Reactions  . Penicillins Rash    Has patient had a PCN reaction causing immediate rash, facial/tongue/throat swelling, SOB or lightheadedness with hypotension: no Has patient had a PCN reaction causing severe rash involving mucus membranes or skin necrosis: no Has patient had a PCN reaction that required hospitalization: no Has patient had a PCN reaction occurring within the last 10 years: no If all of the above answers are "NO", then may proceed with Cephalosporin use.     ROS   No other specific complaints in a complete review of systems (except as listed in HPI above).  Objective  Vitals:   05/24/19 1524  BP: 121/63  Resp: 14     There is no height or weight on file to calculate BMI.  Nursing Note and Vital Signs reviewed.  Physical  Exam  Constitutional: Patient appears well-developed and well-nourished. No distress.  HENT: Head: Normocephalic and atraumatic. Pulmonary/Chest: Effort normal  Musculoskeletal: Normal range of motion,  Neurological: alert and oriented, speech normal.  Psychiatric: Patient has a normal mood and affect. behavior is normal. Judgment and thought content normal.    Assessment & Plan  1. Essential hypertension Stable continue meds - COMPLETE METABOLIC PANEL WITH GFR  2. Chronic right-sided low back pain with right-sided sciatica Stable continue meds  3. 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 Mixed hyperlipidemia Stable continue meds - Lipid Profile  5. Hyperglycemia - HgB A1c  6. Medication monitoring  encounter - COMPLETE METABOLIC PANEL WITH GFR  7. Obesity (BMI 30.0-34.9) Healthy eating and exercise discussed.   Follow Up Instructions:    I discussed the assessment and treatment plan with the patient. The patient was provided an opportunity to ask questions and all were answered. The patient agreed with the plan and demonstrated an understanding of the instructions.   The patient was advised to call back or seek an in-person evaluation if the symptoms worsen or if the condition fails to improve as anticipated.  I provided 23 minutes of non-face-to-face time during this encounter.   Fredderick Severance, NP

## 2019-05-24 NOTE — Patient Instructions (Signed)
Foods and drinks to limit include  . fried foods and other foods high in saturated fat and trans fat  . foods high in salt, also called sodium  . sweets, such as baked goods, candy, and ice cream  . beverages with added sugars, such as juice, regular soda, and regular sports or energy drinks  Drink water instead of sweetened beverages. Consider using a sugar substitute in your coffee or tea.   Instead, eat carbohydrates from fruit, vegetables, whole grains, beans, and low-fat or nonfat milk. Choose healthy carbohydrates, such as fruit, vegetables, whole grains, beans, and low-fat milk, as part of your diabetes meal plan.

## 2019-06-15 ENCOUNTER — Ambulatory Visit: Payer: Medicare Other

## 2019-06-22 ENCOUNTER — Encounter: Payer: Self-pay | Admitting: Urology

## 2019-06-22 ENCOUNTER — Ambulatory Visit: Payer: Medicare Other | Admitting: Urology

## 2019-06-23 ENCOUNTER — Other Ambulatory Visit: Payer: Self-pay

## 2019-06-23 DIAGNOSIS — I10 Essential (primary) hypertension: Secondary | ICD-10-CM

## 2019-06-23 DIAGNOSIS — R251 Tremor, unspecified: Secondary | ICD-10-CM

## 2019-06-23 MED ORDER — PROPRANOLOL HCL 10 MG PO TABS: 10.0000 mg | ORAL_TABLET | Freq: Three times a day (TID) | ORAL | 1 refills | Status: DC

## 2019-06-28 ENCOUNTER — Ambulatory Visit: Payer: Medicare Other

## 2019-06-30 ENCOUNTER — Ambulatory Visit (INDEPENDENT_AMBULATORY_CARE_PROVIDER_SITE_OTHER): Payer: Medicare Other

## 2019-06-30 DIAGNOSIS — Z23 Encounter for immunization: Secondary | ICD-10-CM | POA: Diagnosis not present

## 2019-07-20 ENCOUNTER — Other Ambulatory Visit: Payer: Self-pay

## 2019-07-20 ENCOUNTER — Ambulatory Visit (INDEPENDENT_AMBULATORY_CARE_PROVIDER_SITE_OTHER): Payer: Medicare Other

## 2019-07-20 VITALS — Ht 72.0 in | Wt 232.0 lb

## 2019-07-20 DIAGNOSIS — Z0000 Encounter for general adult medical examination without abnormal findings: Secondary | ICD-10-CM | POA: Diagnosis not present

## 2019-07-20 NOTE — Patient Instructions (Signed)
Jesus Dillon , Thank you for taking time to come for your Medicare Wellness Visit. I appreciate your ongoing commitment to your health goals. Please review the following plan we discussed and let me know if I can assist you in the future.   Screening recommendations/referrals: Colonoscopy: done 08/25/18. Repeat in 2022 Recommended yearly ophthalmology/optometry visit for glaucoma screening and checkup Recommended yearly dental visit for hygiene and checkup  Vaccinations: Influenza vaccine: done 06/30/19 Pneumococcal vaccine: done 12/05/13 Tdap vaccine: done 04/11/12 Shingles vaccine: Shingrix discussed. Please contact your pharmacy for coverage information.   Advanced directives: Please bring a copy of your health care power of attorney and living will to the office at your convenience.  Conditions/risks identified: Recommend increasing physical activity to 150 minutes per week  Next appointment: Please follow up in one year for your Medicare Annual Wellness visit.    Preventive Care 76 Years and Older, Male Preventive care refers to lifestyle choices and visits with your health care provider that can promote health and wellness. What does preventive care include?  A yearly physical exam. This is also called an annual well check.  Dental exams once or twice a year.  Routine eye exams. Ask your health care provider how often you should have your eyes checked.  Personal lifestyle choices, including:  Daily care of your teeth and gums.  Regular physical activity.  Eating a healthy diet.  Avoiding tobacco and drug use.  Limiting alcohol use.  Practicing safe sex.  Taking low doses of aspirin every day.  Taking vitamin and mineral supplements as recommended by your health care provider. What happens during an annual well check? The services and screenings done by your health care provider during your annual well check will depend on your age, overall health, lifestyle risk  factors, and family history of disease. Counseling  Your health care provider may ask you questions about your:  Alcohol use.  Tobacco use.  Drug use.  Emotional well-being.  Home and relationship well-being.  Sexual activity.  Eating habits.  History of falls.  Memory and ability to understand (cognition).  Work and work Statistician. Screening  You may have the following tests or measurements:  Height, weight, and BMI.  Blood pressure.  Lipid and cholesterol levels. These may be checked every 5 years, or more frequently if you are over 10 years old.  Skin check.  Lung cancer screening. You may have this screening every year starting at age 37 if you have a 30-pack-year history of smoking and currently smoke or have quit within the past 15 years.  Fecal occult blood test (FOBT) of the stool. You may have this test every year starting at age 57.  Flexible sigmoidoscopy or colonoscopy. You may have a sigmoidoscopy every 5 years or a colonoscopy every 10 years starting at age 94.  Prostate cancer screening. Recommendations will vary depending on your family history and other risks.  Hepatitis C blood test.  Hepatitis B blood test.  Sexually transmitted disease (STD) testing.  Diabetes screening. This is done by checking your blood sugar (glucose) after you have not eaten for a while (fasting). You may have this done every 1-3 years.  Abdominal aortic aneurysm (AAA) screening. You may need this if you are a current or former smoker.  Osteoporosis. You may be screened starting at age 72 if you are at high risk. Talk with your health care provider about your test results, treatment options, and if necessary, the need for more tests. Vaccines  Your health care provider may recommend certain vaccines, such as:  Influenza vaccine. This is recommended every year.  Tetanus, diphtheria, and acellular pertussis (Tdap, Td) vaccine. You may need a Td booster every 10  years.  Zoster vaccine. You may need this after age 65.  Pneumococcal 13-valent conjugate (PCV13) vaccine. One dose is recommended after age 14.  Pneumococcal polysaccharide (PPSV23) vaccine. One dose is recommended after age 45. Talk to your health care provider about which screenings and vaccines you need and how often you need them. This information is not intended to replace advice given to you by your health care provider. Make sure you discuss any questions you have with your health care provider. Document Released: 10/04/2015 Document Revised: 05/27/2016 Document Reviewed: 07/09/2015 Elsevier Interactive Patient Education  2017 [place] Prevention in the Home Falls can cause injuries. They can happen to people of all ages. There are many things you can do to make your home safe and to help prevent falls. What can I do on the outside of my home?  Regularly fix the edges of walkways and driveways and fix any cracks.  Remove anything that might make you trip as you walk through a door, such as a raised step or threshold.  Trim any bushes or trees on the path to your home.  Use bright outdoor lighting.  Clear any walking paths of anything that might make someone trip, such as rocks or tools.  Regularly check to see if handrails are loose or broken. Make sure that both sides of any steps have handrails.  Any raised decks and porches should have guardrails on the edges.  Have any leaves, snow, or ice cleared regularly.  Use sand or salt on walking paths during winter.  Clean up any spills in your garage right away. This includes oil or grease spills. What can I do in the bathroom?  Use night lights.  Install grab bars by the toilet and in the tub and shower. Do not use towel bars as grab bars.  Use non-skid mats or decals in the tub or shower.  If you need to sit down in the shower, use a plastic, non-slip stool.  Keep the floor dry. Clean up any water that  spills on the floor as soon as it happens.  Remove soap buildup in the tub or shower regularly.  Attach bath mats securely with double-sided non-slip rug tape.  Do not have throw rugs and other things on the floor that can make you trip. What can I do in the bedroom?  Use night lights.  Make sure that you have a light by your bed that is easy to reach.  Do not use any sheets or blankets that are too big for your bed. They should not hang down onto the floor.  Have a firm chair that has side arms. You can use this for support while you get dressed.  Do not have throw rugs and other things on the floor that can make you trip. What can I do in the kitchen?  Clean up any spills right away.  Avoid walking on wet floors.  Keep items that you use a lot in easy-to-reach places.  If you need to reach something above you, use a strong step stool that has a grab bar.  Keep electrical cords out of the way.  Do not use floor polish or wax that makes floors slippery. If you must use wax, use non-skid floor wax.  Do  not have throw rugs and other things on the floor that can make you trip. What can I do with my stairs?  Do not leave any items on the stairs.  Make sure that there are handrails on both sides of the stairs and use them. Fix handrails that are broken or loose. Make sure that handrails are as long as the stairways.  Check any carpeting to make sure that it is firmly attached to the stairs. Fix any carpet that is loose or worn.  Avoid having throw rugs at the top or bottom of the stairs. If you do have throw rugs, attach them to the floor with carpet tape.  Make sure that you have a light switch at the top of the stairs and the bottom of the stairs. If you do not have them, ask someone to add them for you. What else can I do to help prevent falls?  Wear shoes that:  Do not have high heels.  Have rubber bottoms.  Are comfortable and fit you well.  Are closed at the  toe. Do not wear sandals.  If you use a stepladder:  Make sure that it is fully opened. Do not climb a closed stepladder.  Make sure that both sides of the stepladder are locked into place.  Ask someone to hold it for you, if possible.  Clearly mark and make sure that you can see:  Any grab bars or handrails.  First and last steps.  Where the edge of each step is.  Use tools that help you move around (mobility aids) if they are needed. These include:  Canes.  Walkers.  Scooters.  Crutches.  Turn on the lights when you go into a dark area. Replace any light bulbs as soon as they burn out.  Set up your furniture so you have a clear path. Avoid moving your furniture around.  If any of your floors are uneven, fix them.  If there are any pets around you, be aware of where they are.  Review your medicines with your doctor. Some medicines can make you feel dizzy. This can increase your chance of falling. Ask your doctor what other things that you can do to help prevent falls. This information is not intended to replace advice given to you by your health care provider. Make sure you discuss any questions you have with your health care provider. Document Released: 07/04/2009 Document Revised: 02/13/2016 Document Reviewed: 10/12/2014 Elsevier Interactive Patient Education  2017 [employer].

## 2019-07-20 NOTE — Progress Notes (Signed)
Subjective:   Jesus Dillon is a 78 y.o. male who presents for Medicare Annual/Subsequent preventive examination.  Virtual Visit via Telephone Note  I connected with Jesus Dillon on 07/20/19 at  4:10 PM EDT by telephone and verified that I am speaking with the correct person using two identifiers.  Medicare Annual Wellness visit completed telephonically due to Covid-19 pandemic.   Location: Patient: home Provider: office   I discussed the limitations, risks, security and privacy concerns of performing an evaluation and management service by telephone and the availability of in person appointments. The patient expressed understanding and agreed to proceed.  Some vital signs may be absent or patient reported.   Jesus Marker, LPN    Review of Systems:   Cardiac Risk Factors include: advanced age (>84men, >60 women);hypertension;male gender;dyslipidemia;obesity (BMI >30kg/m2)     Objective:    Vitals: Ht 6' (1.829 m)   Wt 232 lb (105.2 kg)   BMI 31.46 kg/m   Body mass index is 31.46 kg/m.  Advanced Directives 07/20/2019 08/25/2018 06/09/2018 03/01/2018 02/24/2018 06/18/2017 02/16/2017  Does Patient Have a Medical Advance Directive? Yes Yes Yes Yes Yes Yes No  Type of Paramedic of [place];Living will [place];Living will [place];Living will [place];Living will [place];Living will [place];Living will -  Does patient want to make changes to medical advance directive? - - - No - Patient declined No - Patient declined - -  Copy of [place] in Chart? No - copy requested No - copy requested No - copy requested Yes No - copy requested Yes -  Would patient like information on creating a medical advance directive? - - - - - - -    Tobacco Social History   Tobacco Use  Smoking Status Never Smoker  Smokeless Tobacco Never Used  Tobacco  Comment   smoking cessation materials not required     Counseling given: Not Answered Comment: smoking cessation materials not required   Clinical Intake:  Pre-visit preparation completed: Yes  Pain : No/denies pain     BMI - recorded: 31.46 Nutritional Status: BMI > 30  Obese Nutritional Risks: None Diabetes: No  How often do you need to have someone help you when you read instructions, pamphlets, or other written materials from your doctor or pharmacy?: 1 - Never  Interpreter Needed?: No  Information entered by :: Jesus Marker LPN  Past Medical History:  Diagnosis Date  . Arthritis   . Diastolic dysfunction 0000000   Grade 1, echo Sept 2019  . GERD (gastroesophageal reflux disease)   . Gout   . HBP (high blood pressure)   . Heart murmur   . Hyperlipidemia   . 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 Mitral regurgitation 06/17/2018   Echo Sept 2019  . Pulmonary regurgitation 06/17/2018   Echo Sept 2019  . Tremors of nervous system    Past Surgical History:  Procedure Laterality Date  . ANAL FISSURECTOMY    . COLONOSCOPY WITH PROPOFOL N/A 08/25/2018   Procedure: COLONOSCOPY WITH PROPOFOL;  Surgeon: Jonathon Bellows, MD;  Location: [facility] ENDOSCOPY;  Service: Gastroenterology;  Laterality: N/A;  . HYDROCELE EXCISION Left 03/01/2018   Procedure: HYDROCELECTOMY ADULT;  Surgeon: Abbie Sons, MD;  Location: [facility] ORS;  Service: Urology;  Laterality: Left;   Family History  Problem Relation Age of Onset  . Hypertension Mother   .  Dementia Mother   . Angina Mother   . Diabetes Brother   . Hypertension Brother   . Blindness Maternal Grandmother    Social History   Socioeconomic History  . Marital status: Married    Spouse name: Levander Campion  . Number of children: 1  . Years of education: Not on file  . Highest education level: Master's degree (e.g., MA, MS, MEng, MEd, MSW, MBA)  Occupational History  . Occupation: Retired  Scientific laboratory technician  . Financial  resource strain: Not hard at all  . Food insecurity    Worry: Never true    Inability: Never true  . Transportation needs    Medical: No    Non-medical: No  Tobacco Use  . Smoking status: Never Smoker  . Smokeless tobacco: Never Used  . Tobacco comment: smoking cessation materials not required  Substance and Sexual Activity  . Alcohol use: No    Alcohol/week: 0.0 standard drinks  . Drug use: No  . Sexual activity: Yes    Partners: Female  Lifestyle  . Physical activity    Days per week: 0 days    Minutes per session: 0 min  . Stress: Not at all  Relationships  . Social Herbalist on phone: Patient refused    Gets together: Patient refused    Attends religious service: Patient refused    Active member of club or organization: Patient refused    Attends meetings of clubs or organizations: Patient refused    Relationship status: Married  Other Topics Concern  . Not on file  Social History Narrative  . Not on file    Outpatient Encounter Medications as of 07/20/2019  Medication Sig  . amLODipine (NORVASC) 5 MG tablet Take 1 tablet (5 mg total) by mouth daily.  Marland Kitchen aspirin EC 81 MG tablet Take 1 tablet (81 mg total) by mouth daily. No NSAIDs for at least one hour  . atorvastatin (LIPITOR) 40 MG tablet TAKE 1 TABLET AT BEDTIME  . cholecalciferol (VITAMIN D) 1000 units tablet Take 1,000 Units by mouth daily.  Marland Kitchen gabapentin (NEURONTIN) 300 MG capsule Take 1 capsule (300 mg total) by mouth 3 (three) times daily.  . propranolol (INDERAL) 10 MG tablet Take 1 tablet (10 mg total) by mouth 3 (three) times daily.  Marland Kitchen telmisartan (MICARDIS) 80 MG tablet Take 1 tablet (80 mg total) by mouth daily.   No facility-administered encounter medications on file as of 07/20/2019.     Activities of Daily Living In your present state of health, do you have any difficulty performing the following activities: 07/20/2019 05/24/2019  Hearing? N N  Comment declines hearing aids -  Vision? N  N  Difficulty concentrating or making decisions? N N  Walking or climbing stairs? N N  Dressing or bathing? N N  Doing errands, shopping? N N  Preparing Food and eating ? N -  Using the Toilet? N -  In the past six months, have you accidently leaked urine? N -  Do you have problems with loss of bowel control? N -  Managing your Medications? N -  Managing your Finances? N -  Housekeeping or managing your Housekeeping? N -  Some recent data might be hidden    Patient Care Team: Delsa Grana, PA-C as PCP - General (Family Medicine) Rockey Situ, Kathlene November, MD as Consulting Physician (Cardiology) Abbie Sons, MD as Consulting Physician (Urology) Thornton Park, MD as Consulting Physician (Orthopedic Surgery)   Assessment:   This  is a routine wellness examination for [place].  Exercise Activities and Dietary recommendations Current Exercise Habits: The patient does not participate in regular exercise at present, Exercise limited by: None identified  Goals    . DIET - REDUCE SODIUM INTAKE     Recommend to begin DASH diet (Diet attached to AVS).    . Increase physical activity     Recommend increasing physical activity to 150 minutes per week       Fall Risk Fall Risk  07/20/2019 05/24/2019 02/21/2019 10/18/2018 06/24/2018  Falls in the past year? 0 0 0 0 No  Number falls in past yr: 0 0 0 0 -  Injury with Fall? 0 0 0 0 -  Risk for fall due to : - - - - -  Risk for fall due to: Comment - - - - -  Follow up Falls prevention discussed - - - -    FALL RISK PREVENTION PERTAINING TO THE HOME:  Any stairs in or around the home? Yes  If so, do they handrails? Yes   Home free of loose throw rugs in walkways, pet beds, electrical cords, etc? Yes  Adequate lighting in your home to reduce risk of falls? Yes   ASSISTIVE DEVICES UTILIZED TO PREVENT FALLS:  Life alert? No  Use of a cane, walker or w/c? No  Grab bars in the bathroom? Yes  Shower chair or bench in shower? Yes  Elevated  toilet seat or a handicapped toilet? Yes   DME ORDERS:  DME order needed?  No   TIMED UP AND GO:  Was the test performed? No . Telephonic visit.   Education: Fall risk prevention has been discussed.  Intervention(s) required? No   Depression Screen PHQ 2/9 Scores 07/20/2019 05/24/2019 02/21/2019 10/18/2018  PHQ - 2 Score 0 0 0 0  PHQ- 9 Score - 0 0 0    Cognitive Function     6CIT Screen 07/20/2019 06/09/2018  What Year? 0 points 0 points  What month? 0 points 0 points  What time? 0 points 0 points  Count back from 20 0 points 0 points  Months in reverse 0 points 4 points  Repeat phrase 0 points 0 points  Total Score 0 4    Immunization History  Administered Date(s) Administered  . Fluad Quad(high Dose 65+) 06/30/2019  . Influenza, High Dose Seasonal PF 06/18/2017, 06/06/2018  . Pneumococcal Conjugate-13 12/05/2013  . Pneumococcal Polysaccharide-23 04/11/2012  . Tdap 04/11/2012    Qualifies for Shingles Vaccine? Yes . Due for Shingrix. Education has been provided regarding the importance of this vaccine. Pt has been advised to call insurance company to determine out of pocket expense. Advised may also receive vaccine at local pharmacy or Health Dept. Verbalized acceptance and understanding.  Tdap: Up to date  Flu Vaccine: Up to date  Pneumococcal Vaccine: Up to date   Screening Tests Health Maintenance  Topic Date Due  . COLONOSCOPY  08/25/2021  . TETANUS/TDAP  04/11/2022  . INFLUENZA VACCINE  Completed  . PNA vac Low Risk Adult  Completed   Cancer Screenings:  Colorectal Screening: Completed 08/25/18. Repeat every 3 years;  Lung Cancer Screening: (Low Dose CT Chest recommended if Age 97-80 years, 30 pack-year currently smoking OR have quit w/in 15years.) does not qualify.    Additional Screening:  Hepatitis C Screening: no longer required  Vision Screening: Recommended annual ophthalmology exams for early detection of glaucoma and other disorders of the  eye. Is the patient up  to date with their annual eye exam?  Yes  Who is the provider or what is the name of the office in which the pt attends annual eye exams? [place] Screening: Recommended annual dental exams for proper oral hygiene  Community Resource Referral:  CRR required this visit?  No       Plan:    I have personally reviewed and addressed the Medicare Annual Wellness questionnaire and have noted the following in the patient's chart:  A. Medical and social history B. Use of alcohol, tobacco or illicit drugs  C. Current medications and supplements D. Functional ability and status E.  Nutritional status F.  Physical activity G. Advance directives H. List of other physicians I.  Hospitalizations, surgeries, and ER visits in previous 12 months J.  [place] such as hearing and vision if needed, cognitive and depression L. Referrals and appointments   In addition, I have reviewed and discussed with patient certain preventive protocols, quality metrics, and best practice recommendations. A written personalized care plan for preventive services as well as general preventive health recommendations were provided to patient.   Signed,  Jesus Marker, LPN Nurse Health Advisor   Nurse Notes: none

## 2019-08-11 ENCOUNTER — Other Ambulatory Visit: Payer: Self-pay | Admitting: Urology

## 2019-08-11 DIAGNOSIS — N5089 Other specified disorders of the male genital organs: Secondary | ICD-10-CM

## 2019-08-28 ENCOUNTER — Other Ambulatory Visit: Payer: Self-pay | Admitting: Family Medicine

## 2019-08-28 DIAGNOSIS — I10 Essential (primary) hypertension: Secondary | ICD-10-CM

## 2019-08-28 NOTE — Telephone Encounter (Signed)
Copied from [place] [phone]. Topic: Quick Communication - Rx Refill/Question >> Aug 28, 2019  4:55 PM Jesus Dillon, [place] A wrote: Medication: amLODipine (NORVASC) 5 MG tablet (Patient needs new prescriptions sent to pharmacy.)  Has the patient contacted their pharmacy? Yes (Agent: If no, request that the patient contact the pharmacy for the refill.) (Agent: If yes, when and what did the pharmacy advise?)Contact PCP  Preferred Pharmacy (with phone number or street name): [facility], [place] [phone] (Phone) [phone] (Fax)    Agent: Please be advised that RX refills may take up to 3 business days. We ask that you follow-up with your pharmacy.

## 2019-08-29 MED ORDER — AMLODIPINE BESYLATE 5 MG PO TABS: 5.0000 mg | ORAL_TABLET | Freq: Every day | ORAL | 1 refills | Status: DC

## 2019-09-06 ENCOUNTER — Telehealth: Payer: Self-pay | Admitting: Family Medicine

## 2019-09-06 MED ORDER — ATORVASTATIN CALCIUM 40 MG PO TABS: 40.0000 mg | ORAL_TABLET | Freq: Every day | ORAL | 0 refills | Status: DC

## 2019-09-06 NOTE — Telephone Encounter (Signed)
Pt called and stated that he would like a 2 week supply of Atorvastatin. Pt is waiting on mail order. Please advise     [id number] [address], [place] Phone:  [phone]  Fax:  [phone]

## 2019-09-06 NOTE — Addendum Note (Signed)
Addended by: Delsa Grana on: 09/06/2019 02:43 PM   Modules accepted: Orders

## 2019-09-19 MED ORDER — ATORVASTATIN CALCIUM 40 MG PO TABS: 40.0000 mg | ORAL_TABLET | Freq: Every day | ORAL | 0 refills | Status: DC

## 2019-09-19 NOTE — Addendum Note (Signed)
Addended by: Cathrine Muster C on: 09/19/2019 11:50 AM   Modules accepted: Orders

## 2019-09-19 NOTE — Addendum Note (Signed)
Addended by: Delsa Grana on: 09/19/2019 01:59 PM   Modules accepted: Orders

## 2019-09-19 NOTE — Telephone Encounter (Signed)
Pt's spouse called in for assistance. She said that pt still hasn't received his mail order, she would like to know if provider could send in another supply to local pharmacy. Spouse says that they tracked order and it is showing to be near but they are afraid for pt to run out of medication.. please advise.    Pharmacy: [id number] [address], [place]  Phone:  [phone]  Fax:  [phone]

## 2019-09-27 ENCOUNTER — Ambulatory Visit: Payer: Medicare Other | Admitting: Family Medicine

## 2019-10-06 ENCOUNTER — Encounter: Payer: Self-pay | Admitting: Family Medicine

## 2019-10-06 ENCOUNTER — Ambulatory Visit: Payer: Medicare Other | Admitting: Family Medicine

## 2019-10-06 ENCOUNTER — Other Ambulatory Visit: Payer: Self-pay

## 2019-10-06 VITALS — BP 134/78 | HR 65 | Temp 97.8°F | Resp 16 | Ht 72.0 in | Wt 238.6 lb

## 2019-10-06 DIAGNOSIS — E782 Mixed hyperlipidemia: Secondary | ICD-10-CM

## 2019-10-06 DIAGNOSIS — M48062 Spinal stenosis, lumbar region with neurogenic claudication: Secondary | ICD-10-CM

## 2019-10-06 DIAGNOSIS — M48061 Spinal stenosis, lumbar region without neurogenic claudication: Secondary | ICD-10-CM | POA: Diagnosis not present

## 2019-10-06 DIAGNOSIS — M47816 Spondylosis without myelopathy or radiculopathy, lumbar region: Secondary | ICD-10-CM

## 2019-10-06 DIAGNOSIS — R7303 Prediabetes: Secondary | ICD-10-CM

## 2019-10-06 DIAGNOSIS — I10 Essential (primary) hypertension: Secondary | ICD-10-CM | POA: Diagnosis not present

## 2019-10-06 DIAGNOSIS — I340 Nonrheumatic mitral (valve) insufficiency: Secondary | ICD-10-CM

## 2019-10-06 DIAGNOSIS — Z5181 Encounter for therapeutic drug level monitoring: Secondary | ICD-10-CM

## 2019-10-06 DIAGNOSIS — I517 Cardiomegaly: Secondary | ICD-10-CM

## 2019-10-06 DIAGNOSIS — M5441 Lumbago with sciatica, right side: Secondary | ICD-10-CM | POA: Diagnosis not present

## 2019-10-06 DIAGNOSIS — G8929 Other chronic pain: Secondary | ICD-10-CM

## 2019-10-06 MED ORDER — GABAPENTIN 300 MG PO CAPS: 300.0000 mg | ORAL_CAPSULE | Freq: Three times a day (TID) | ORAL | 1 refills | Status: DC

## 2019-10-06 MED ORDER — TELMISARTAN 80 MG PO TABS: 80.0000 mg | ORAL_TABLET | Freq: Every day | ORAL | 1 refills | Status: DC

## 2019-10-06 NOTE — Progress Notes (Signed)
Name: Jesus Dillon   MRN: [id number]    DOB: Aug 13, 1941   Date:10/06/2019       Progress Note  Chief Complaint  Patient presents with  . Follow-up    medication f/u, discuss gabapentin     Subjective:   Jesus Dillon is a 79 y.o. male, presents to clinic for routine follow up on the conditions listed above.  HLD: On lipitor 40 mg taking every day at bedtime no myalgias or fatigue No claudication CP Last Lipids: Lab Results  Component Value Date   CHOL 137 06/06/2018   HDL 44 06/06/2018   LDLCALC 78 06/06/2018   TRIG 72 06/06/2018   CHOLHDL 3.1 06/06/2018   - Current Diet:  healthy - Denies: Chest pain, shortness of breath, myalgias. - Documented aortic atherosclerosis? No - Risk factors for atherosclerosis: hypercholesterolemia and hypertension    HTN Well controlled with telmisartan norvasc and propanolol for tremor Pt reports good med compliance and denies any SE.  No lightheadedness, hypotension, syncope. Blood pressure today is well controlled. BP Readings from Last 3 Encounters:  10/06/19 134/78  05/24/19 121/63  02/21/19 124/78   Pt denies CP, SOB, exertional sx, LE edema, palpitation, Ha's, visual disturbances Dietary efforts for BP?  Very healthy no salt  Chronic back pain to back started in 2008 - when he lived in [place] is when back pain started he did MRI which showed a disc problem, when he moved to [place] and est here he was put on gabapentin.  He states it works well, over the past couple years he has not noticed any progressive sx, no new numbness, weakness, saddle anesthesia, incontinence.  A few times in the morning his back pain and sciatica is "tight" over all.  His past PCP did an MRI of lumbar spine about 10 months ago and referred him to neurosurgery/spine due to findings, he never went due to covid.  He would like to est with specialist. MRI reviewed with the pt  Diet efforts very healthy overall, salads, water, lean meats  He mentions that he  sees cardiology for murmur he's had his whole life (MI) but he has not seen them in some time.  States he has done stress tests and echo, and he sees usually once a year.   Reviewed cardiac testing,  He denies any exertional sx, no CP, SOB, denies any change for years to b/l LE edema, get mild edema at the end of the day that resolves by morning, usually socks leave a little dent.  No orthopnea, near syncope, palpitations.  Patient Active Problem List   Diagnosis Date Noted  . Obesity (BMI 30.0-34.9) 07/04/2018  . Diastolic dysfunction 123456  . Left atrial dilation 06/17/2018  . Mitral regurgitation 06/17/2018  . 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 Pulmonary regurgitation 06/17/2018  . Lumbar canal stenosis 12/20/2017  . Abnormal EKG 12/20/2017  . 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 Tremors of nervous system 04/03/2016  . Hyperlipidemia 02/27/2016  . Hyperglycemia 02/27/2016  . Cardiac murmur 02/27/2016  . HBP (high blood pressure) 12/25/2015  . Chronic right-sided low back pain with right-sided sciatica 12/25/2015    Past Surgical History:  Procedure Laterality Date  . ANAL FISSURECTOMY    . COLONOSCOPY WITH PROPOFOL N/A 08/25/2018   Procedure: COLONOSCOPY WITH PROPOFOL;  Surgeon: Jonathon Bellows, MD;  Location: [facility] ENDOSCOPY;  Service: Gastroenterology;  Laterality: N/A;  . HYDROCELE EXCISION Left 03/01/2018   Procedure: HYDROCELECTOMY ADULT;  Surgeon: Abbie Sons, MD;  Location:  [facility] ORS;  Service: Urology;  Laterality: Left;    Family History  Problem Relation Age of Onset  . Hypertension Mother   . Dementia Mother   . Angina Mother   . Diabetes Brother   . Hypertension Brother   . Blindness Maternal Grandmother     Social History   Socioeconomic History  . Marital status: Married    Spouse name: Levander Campion  . Number of children: 1  . Years of education: Not on file  . Highest education level: Master's degree (e.g., MA, MS, MEng, MEd, MSW, MBA)  Occupational History  .  Occupation: Retired  Tobacco Use  . Smoking status: Never Smoker  . Smokeless tobacco: Never Used  . Tobacco comment: smoking cessation materials not required  Substance and Sexual Activity  . Alcohol use: No    Alcohol/week: 0.0 standard drinks  . Drug use: No  . Sexual activity: Yes    Partners: Female  Other Topics Concern  . Not on file  Social History Narrative  . Not on file   Social Determinants of Health   Financial Resource Strain:   . Difficulty of Paying Living Expenses: Not on file  Food Insecurity:   . Worried About Charity fundraiser in the Last Year: Not on file  . Ran Out of Food in the Last Year: Not on file  Transportation Needs:   . Lack of Transportation (Medical): Not on file  . Lack of Transportation (Non-Medical): Not on file  Physical Activity:   . Days of Exercise per Week: Not on file  . Minutes of Exercise per Session: Not on file  Stress:   . Feeling of Stress : Not on file  Social Connections:   . Frequency of Communication with Friends and Family: Not on file  . Frequency of Social Gatherings with Friends and Family: Not on file  . Attends Religious Services: Not on file  . Active Member of Clubs or Organizations: Not on file  . Attends Archivist Meetings: Not on file  . Marital Status: Not on file  Intimate Partner Violence:   . Fear of Current or Ex-Partner: Not on file  . Emotionally Abused: Not on file  . Physically Abused: Not on file  . Sexually Abused: Not on file     Current Outpatient Medications:  .  amLODipine (NORVASC) 5 MG tablet, Take 1 tablet (5 mg total) by mouth daily., Disp: 90 tablet, Rfl: 1 .  aspirin EC 81 MG tablet, Take 1 tablet (81 mg total) by mouth daily. No NSAIDs for at least one hour, Disp: , Rfl:  .  atorvastatin (LIPITOR) 40 MG tablet, Take 1 tablet (40 mg total) by mouth at bedtime., Disp: 14 tablet, Rfl: 0 .  cholecalciferol (VITAMIN D) 1000 units tablet, Take 1,000 Units by mouth daily.,  Disp: , Rfl:  .  gabapentin (NEURONTIN) 300 MG capsule, Take 1 capsule (300 mg total) by mouth 3 (three) times daily., Disp: 270 capsule, Rfl: 1 .  propranolol (INDERAL) 10 MG tablet, Take 1 tablet (10 mg total) by mouth 3 (three) times daily., Disp: 180 tablet, Rfl: 1 .  telmisartan (MICARDIS) 80 MG tablet, Take 1 tablet (80 mg total) by mouth daily., Disp: 90 tablet, Rfl: 1  Allergies  Allergen Reactions  . Penicillins Rash    Has patient had a PCN reaction causing immediate rash, facial/tongue/throat swelling, SOB or lightheadedness with hypotension: no Has patient had a PCN reaction causing severe rash involving mucus membranes  or skin necrosis: no Has patient had a PCN reaction that required hospitalization: no Has patient had a PCN reaction occurring within the last 10 years: no If all of the above answers are "NO", then may proceed with Cephalosporin use.     Chart Review Today: I personally reviewed active problem list, medication list, allergies, family history, social history, health maintenance, notes from last encounter, lab results, imaging with the patient/caregiver today. Reviewed additonally what was noted above  Review of Systems  Constitutional: Negative.   HENT: Negative.   Eyes: Negative.   Respiratory: Negative.   Cardiovascular: Negative.   Gastrointestinal: Negative.   Endocrine: Negative.   Genitourinary: Negative.   Musculoskeletal: Negative.   Skin: Negative.   Allergic/Immunologic: Negative.   Neurological: Negative.   Hematological: Negative.   Psychiatric/Behavioral: Negative.   All other systems reviewed and are negative.    Objective:    Vitals:   10/06/19 1419  BP: 134/78  Pulse: 65  Resp: 16  Temp: 97.8 F (36.6 C)  TempSrc: Temporal  SpO2: 98%  Weight: 238 lb 9.6 oz (108.2 kg)  Height: 6' (1.829 m)    Body mass index is 32.36 kg/m.  Physical Exam Vitals and nursing note reviewed.  Constitutional:      General: He is not in  acute distress.    Appearance: Normal appearance. He is well-developed. He is obese. He is not ill-appearing, toxic-appearing or diaphoretic.     Interventions: Face mask in place.  HENT:     Head: Normocephalic and atraumatic.     Jaw: No trismus.     Right Ear: External ear normal.     Left Ear: External ear normal.  Eyes:     General: Lids are normal. No scleral icterus.    Conjunctiva/sclera: Conjunctivae normal.     Pupils: Pupils are equal, round, and reactive to light.  Neck:     Trachea: Trachea and phonation normal. No tracheal deviation.  Cardiovascular:     Rate and Rhythm: Normal rate and regular rhythm.     Pulses: Normal pulses.          Radial pulses are 2+ on the right side and 2+ on the left side.       Posterior tibial pulses are 2+ on the right side and 2+ on the left side.     Heart sounds: Normal heart sounds. No murmur. No friction rub. No gallop.   Pulmonary:     Effort: Pulmonary effort is normal. No respiratory distress.     Breath sounds: Normal breath sounds. No stridor. No wheezing, rhonchi or rales.  Abdominal:     General: Bowel sounds are normal. There is no distension.     Palpations: Abdomen is soft.     Tenderness: There is no abdominal tenderness. There is no guarding or rebound.  Musculoskeletal:        General: Normal range of motion.     Cervical back: Normal range of motion and neck supple.     Right lower leg: 1+ Edema present.     Left lower leg: 1+ Edema present.     Comments: 5/5 strength b/l dorsiflexion and plantarflexion, grossly normal sensation to light touch to b/l LE Good ROM of back, normal gait  Skin:    General: Skin is warm and dry.     Capillary Refill: Capillary refill takes less than 2 seconds.     Coloration: Skin is not jaundiced.     Findings: No rash.  Nails: There is no clubbing.  Neurological:     Mental Status: He is alert.     Cranial Nerves: No dysarthria or facial asymmetry.     Motor: No tremor or  abnormal muscle tone.     Gait: Gait normal.  Psychiatric:        Mood and Affect: Mood normal.        Speech: Speech normal.        Behavior: Behavior normal. Behavior is cooperative.       PHQ2/9: Depression screen [facility] 2/9 10/06/2019 07/20/2019 05/24/2019 02/21/2019 10/18/2018  Decreased Interest 0 0 0 0 0  Down, Depressed, Hopeless 0 0 0 0 0  PHQ - 2 Score 0 0 0 0 0  Altered sleeping 0 - 0 0 0  Tired, decreased energy 0 - 0 0 0  Change in appetite 0 - 0 0 0  Feeling bad or failure about yourself  0 - 0 0 0  Trouble concentrating 0 - 0 0 0  Moving slowly or fidgety/restless 0 - 0 0 0  Suicidal thoughts 0 - 0 0 0  PHQ-9 Score 0 - 0 0 0  Difficult doing work/chores Not difficult at all - Not difficult at all Not difficult at all Not difficult at all  Some recent data might be hidden    phq 9 is reviewed today, neg  Fall Risk: Fall Risk  10/06/2019 07/20/2019 05/24/2019 02/21/2019 10/18/2018  Falls in the past year? 0 0 0 0 0  Number falls in past yr: 0 0 0 0 0  Injury with Fall? 0 0 0 0 0  Risk for fall due to : - - - - -  Risk for fall due to: Comment - - - - -  Follow up - Falls prevention discussed - - -    Functional Status Survey: Is the patient deaf or have difficulty hearing?: No Does the patient have difficulty seeing, even when wearing glasses/contacts?: No Does the patient have difficulty concentrating, remembering, or making decisions?: No Does the patient have difficulty walking or climbing stairs?: No Does the patient have difficulty dressing or bathing?: No Does the patient have difficulty doing errands alone such as visiting a doctor's office or shopping?: No   Assessment & Plan:     ICD-10-CM   1. Essential hypertension  I10 Ambulatory referral to Cardiology    telmisartan (MICARDIS) 80 MG tablet    CMP w GFR   stable, well controlled  2. Chronic right-sided low back pain with right-sided sciatica  M54.41 gabapentin (NEURONTIN) 300 MG capsule   G89.29     gabapentin effective for pain, no change  3. Mixed hyperlipidemia  E78.2 Ambulatory referral to Cardiology    CMP w GFR    Lipid Panel   compliant with meds, no SE or concerns, check labs  4. Spinal stenosis of lumbar region, unspecified whether neurogenic claudication present  M48.061 Ambulatory referral to Neurosurgery   no change in sx, recent MRI lost to f/up with specialist referral due to [place]  5. Spinal stenosis of lumbar region with neurogenic claudication  M48.062 Ambulatory referral to Neurosurgery   see above  6. Facet arthropathy, lumbar  M47.816 Ambulatory referral to Neurosurgery   see above  7. Nonrheumatic mitral valve regurgitation  I34.0 Ambulatory referral to Cardiology   no new cardiac sx, sees cardiology  - referred back   8. Left atrial dilation  I51.7 Ambulatory referral to Cardiology   same as above  9.  Prediabetes  R73.03 A1C   working on very healthy diet, but more sedentary due to covid, recheck labs  10. Medication monitoring encounter  Z51.81 CBC w/ Diff    CMP w GFR    Lipid Panel    A1C      Delsa Grana, PA-C 10/06/19 2:45 PM

## 2019-10-07 LAB — COMPLETE METABOLIC PANEL WITH GFR
AG Ratio: 1.3 (calc) (ref 1.0–2.5)
ALT: 29 U/L (ref 9–46)
AST: 29 U/L (ref 10–35)
Albumin: 4.5 g/dL (ref 3.6–5.1)
Alkaline phosphatase (APISO): 45 U/L (ref 35–144)
BUN: 14 mg/dL (ref 7–25)
CO2: 27 mmol/L (ref 20–32)
Calcium: 9.5 mg/dL (ref 8.6–10.3)
Chloride: 104 mmol/L (ref 98–110)
Creat: 1.14 mg/dL (ref 0.70–1.18)
GFR, Est African American: 72 mL/min/{1.73_m2} (ref >=60)
GFR, Est Non African American: 62 mL/min/{1.73_m2} (ref >=60)
Globulin: 3.6 g/dL (calc) (ref 1.9–3.7)
Glucose, Bld: 87 mg/dL (ref 65–99)
Potassium: 4.4 mmol/L (ref 3.5–5.3)
Sodium: 138 mmol/L (ref 135–146)
Total Bilirubin: 0.9 mg/dL (ref 0.2–1.2)
Total Protein: 8.1 g/dL (ref 6.1–8.1)

## 2019-10-07 LAB — CBC WITH DIFFERENTIAL/PLATELET
Absolute Monocytes: 310 cells/uL (ref 200–950)
Basophils Absolute: 30 cells/uL (ref 0–200)
Basophils Relative: 0.9 %
Eosinophils Absolute: 79 cells/uL (ref 15–500)
Eosinophils Relative: 2.4 %
HCT: 43.6 % (ref 38.5–50.0)
Hemoglobin: 14.3 g/dL (ref 13.2–17.1)
Lymphs Abs: 1789 cells/uL (ref 850–3900)
MCH: 28.1 pg (ref 27.0–33.0)
MCHC: 32.8 g/dL (ref 32.0–36.0)
MCV: 85.7 fL (ref 80.0–100.0)
MPV: 11.5 fL (ref 7.5–12.5)
Monocytes Relative: 9.4 %
Neutro Abs: 1092 cells/uL — ABNORMAL LOW (ref 1500–7800)
Neutrophils Relative %: 33.1 %
Platelets: 131 10*3/uL — ABNORMAL LOW (ref 140–400)
RBC: 5.09 10*6/uL (ref 4.20–5.80)
RDW: 13.1 % (ref 11.0–15.0)
Total Lymphocyte: 54.2 %
WBC: 3.3 10*3/uL — ABNORMAL LOW (ref 3.8–10.8)

## 2019-10-07 LAB — LIPID PANEL
Cholesterol: 147 mg/dL (ref <200)
HDL: 47 mg/dL (ref >=40)
LDL Cholesterol (Calc): 86 mg/dL (calc)
Non-HDL Cholesterol (Calc): 100 mg/dL (calc) (ref <130)
Total CHOL/HDL Ratio: 3.1 (calc) (ref <5.0)
Triglycerides: 54 mg/dL (ref <150)

## 2019-10-07 LAB — HEMOGLOBIN A1C
Hgb A1c MFr Bld: 6.2 % of total Hgb — ABNORMAL HIGH (ref <5.7)
Mean Plasma Glucose: 131 (calc)
eAG (mmol/L): 7.3 (calc)

## 2019-10-09 ENCOUNTER — Ambulatory Visit: Payer: Medicare Other | Admitting: Family Medicine

## 2019-10-21 ENCOUNTER — Other Ambulatory Visit: Payer: Self-pay | Admitting: Family Medicine

## 2019-10-21 DIAGNOSIS — I10 Essential (primary) hypertension: Secondary | ICD-10-CM

## 2019-10-21 DIAGNOSIS — R251 Tremor, unspecified: Secondary | ICD-10-CM

## 2019-10-26 ENCOUNTER — Telehealth: Payer: Self-pay | Admitting: Cardiovascular Disease

## 2019-10-26 NOTE — Telephone Encounter (Signed)
Have made 3 attempts to schedule follow up with no success Deleting recall

## 2019-11-07 ENCOUNTER — Ambulatory Visit: Payer: Medicare Other | Admitting: Family Medicine

## 2020-02-01 DIAGNOSIS — E7800 Pure hypercholesterolemia, unspecified: Secondary | ICD-10-CM | POA: Insufficient documentation

## 2020-02-01 DIAGNOSIS — G250 Essential tremor: Secondary | ICD-10-CM | POA: Insufficient documentation

## 2020-02-01 DIAGNOSIS — M5432 Sciatica, left side: Secondary | ICD-10-CM | POA: Insufficient documentation

## 2020-02-06 ENCOUNTER — Other Ambulatory Visit: Payer: Self-pay | Admitting: Family Medicine

## 2020-02-06 DIAGNOSIS — M5441 Lumbago with sciatica, right side: Secondary | ICD-10-CM

## 2020-02-06 MED ORDER — GABAPENTIN 300 MG PO CAPS: 300.0000 mg | ORAL_CAPSULE | Freq: Three times a day (TID) | ORAL | 0 refills | Status: DC

## 2020-02-06 NOTE — Telephone Encounter (Signed)
Copied from [place] [id number]. Topic: Quick Communication - Rx Refill/Question >> Feb 06, 2020  4:22 PM Leonides Schanz, Jacinto Reap wrote: Pt stated he only has 1 day of medication remaining and it could be a week or so before he is able to get Rx from mail order pharmacy. Pt requests a Rx for gabapentin to be sent to [id number] - [place], [place]  to last until he is able to get mail order delivered.  Medication: gabapentin (NEURONTIN) 300 MG capsule  Has the patient contacted their pharmacy? yes   Preferred Pharmacy (with phone number or street name): [facility] - [address], [place]  Phone: [phone]  Fax: [phone]  Agent: Please be advised that RX refills may take up to 3 business days. We ask that you follow-up with your pharmacy.

## 2020-02-06 NOTE — Telephone Encounter (Signed)
Pt. Requesting refill while waiting for mail order. 30 pills sent to local pharmacy.

## 2020-02-07 NOTE — Telephone Encounter (Signed)
Pt's wife called to report that pt is out of medication and really needs medication as soon as possible.  [id number] [address], [place] [address]  Phone: [phone] Fax: [phone]

## 2020-03-12 ENCOUNTER — Other Ambulatory Visit: Payer: Self-pay

## 2020-03-12 MED ORDER — ATORVASTATIN CALCIUM 40 MG PO TABS: 40.0000 mg | ORAL_TABLET | Freq: Every day | ORAL | 3 refills | Status: DC

## 2020-03-19 DIAGNOSIS — D696 Thrombocytopenia, unspecified: Secondary | ICD-10-CM | POA: Insufficient documentation

## 2020-04-04 ENCOUNTER — Other Ambulatory Visit: Payer: Self-pay | Admitting: Family Medicine

## 2020-04-04 DIAGNOSIS — I10 Essential (primary) hypertension: Secondary | ICD-10-CM

## 2020-04-05 ENCOUNTER — Ambulatory Visit: Payer: Medicare Other | Admitting: Family Medicine

## 2020-04-05 ENCOUNTER — Encounter: Payer: Self-pay | Admitting: Family Medicine

## 2020-04-05 ENCOUNTER — Other Ambulatory Visit: Payer: Self-pay

## 2020-04-05 VITALS — BP 132/84 | Temp 98.1°F | Resp 18 | Ht 72.0 in | Wt 232.7 lb

## 2020-04-05 DIAGNOSIS — R251 Tremor, unspecified: Secondary | ICD-10-CM

## 2020-04-05 DIAGNOSIS — E669 Obesity, unspecified: Secondary | ICD-10-CM

## 2020-04-05 DIAGNOSIS — R7303 Prediabetes: Secondary | ICD-10-CM

## 2020-04-05 DIAGNOSIS — E782 Mixed hyperlipidemia: Secondary | ICD-10-CM | POA: Diagnosis not present

## 2020-04-05 DIAGNOSIS — I340 Nonrheumatic mitral (valve) insufficiency: Secondary | ICD-10-CM | POA: Diagnosis not present

## 2020-04-05 DIAGNOSIS — M48061 Spinal stenosis, lumbar region without neurogenic claudication: Secondary | ICD-10-CM

## 2020-04-05 DIAGNOSIS — I10 Essential (primary) hypertension: Secondary | ICD-10-CM | POA: Diagnosis not present

## 2020-04-05 DIAGNOSIS — D696 Thrombocytopenia, unspecified: Secondary | ICD-10-CM

## 2020-04-05 DIAGNOSIS — M5441 Lumbago with sciatica, right side: Secondary | ICD-10-CM

## 2020-04-05 DIAGNOSIS — Z5181 Encounter for therapeutic drug level monitoring: Secondary | ICD-10-CM

## 2020-04-05 DIAGNOSIS — G8929 Other chronic pain: Secondary | ICD-10-CM

## 2020-04-05 MED ORDER — TELMISARTAN 80 MG PO TABS: 80.0000 mg | ORAL_TABLET | Freq: Every day | ORAL | 3 refills | Status: DC

## 2020-04-05 MED ORDER — AMLODIPINE BESYLATE 10 MG PO TABS: 10.0000 mg | ORAL_TABLET | Freq: Every day | ORAL | 3 refills | Status: DC

## 2020-04-05 MED ORDER — GABAPENTIN 300 MG PO CAPS: 300.0000 mg | ORAL_CAPSULE | Freq: Three times a day (TID) | ORAL | 3 refills | Status: DC

## 2020-04-05 NOTE — Progress Notes (Signed)
Name: Jesus Dillon   MRN: [id number]    DOB: April 21, 1941   Date:04/05/2020       Progress Note  Chief Complaint  Patient presents with  . Follow-up  . Hypertension     Subjective:   Jesus Dillon is a 79 y.o. male, presents to clinic for f/up on chronic conditions  HTN on norvasc 10 mg, propranolol and micardis  Pt reports good med compliance and denies any SE.  No lightheadedness, hypotension, syncope. Blood pressure today is well controlled. BP Readings from Last 3 Encounters:  04/05/20 132/84  10/06/19 134/78  05/24/19 121/63   Pt denies CP, SOB, exertional sx, LE edema, palpitation, Ha's, visual disturbances   Prediabetes: Recent pertinent labs: Lab Results  Component Value Date   HGBA1C 6.2 (H) 10/06/2019   HGBA1C 6.1 (H) 06/09/2018   HGBA1C 6.1 02/27/2016   Obesity:  He has lost about 6 lbs since last OV Wt Readings from Last 5 Encounters:  04/05/20 232 lb 11.2 oz (105.6 kg)  10/06/19 238 lb 9.6 oz (108.2 kg)  07/20/19 232 lb (105.2 kg)  02/21/19 239 lb 8 oz (108.6 kg)  10/18/18 242 lb 1.6 oz (109.8 kg)   BMI Readings from Last 5 Encounters:  04/05/20 31.56 kg/m  10/06/19 32.36 kg/m  07/20/19 31.46 kg/m  02/21/19 32.48 kg/m  10/18/18 32.83 kg/m    Hyperlipidemia: Currently treated with lipitor 40 mg daily, pt reports good med compliance Last Lipids: Lab Results  Component Value Date   CHOL 147 10/06/2019   HDL 47 10/06/2019   LDLCALC 86 10/06/2019   TRIG 54 10/06/2019   CHOLHDL 3.1 10/06/2019   - Denies: Chest pain, shortness of breath, myalgias, claudication  Chronic back pain with sciatica and hx of spinal stenosis MRI done by Dr. Sanda Klein in early 2020, pt does want referral to specialists at [facility]?, his wife saw specialists and he would like to be referred to them as well. No significant change - managing pain well with gabapentin 300 mg TID No new leg pain, weakness, saddle anesthesia, paresthesias, bladder changes, incontinence of  stool or urine. He gets tired when he pushes the [employer], it feels similar to how he used to feel when he ran a lot. Denies exertional leg sx. Prior HPI: Chronic back pain to back started in 2008 - when he lived in [place] is when back pain started he did MRI which showed a disc problem, when he moved to [place] and est here he was put on gabapentin.  He states it works well, over the past couple years he has not noticed any progressive sx, no new numbness, weakness, saddle anesthesia, incontinence.  A few times in the morning his back pain and sciatica is "tight" over all.  His past PCP did an MRI of lumbar spine about 10 months ago and referred him to neurosurgery/spine due to findings, he never went due to covid.  He would like to est with specialist. MRI reviewed with the pt  MRI done with prior PCP 10/2018:  MR lumbar spine w/o contrast:  IMPRESSION: L2-3: Moderate multifactorial spinal stenosis that could be symptomatic. Foraminal stenosis on the left that could affect the L2 nerve. Discogenic endplate edema could be a cause of back pain.  L3-4: Moderate to severe multifactorial stenosis that could cause neural compression.  L4-5: Severe multifactorial spinal stenosis that could cause neural compression. Degenerative anterolisthesis of 6-7 mm at this level.  L5-S1: Subarticular lateral recess and foraminal narrowing right  more than left that could cause neural compression. The facet arthropathy could contribute to back pain.    Current Outpatient Medications:  .  amLODipine (NORVASC) 10 MG tablet, Take 10 mg by mouth daily., Disp: , Rfl:  .  aspirin EC 81 MG tablet, Take 1 tablet (81 mg total) by mouth daily. No NSAIDs for at least one hour, Disp: , Rfl:  .  atorvastatin (LIPITOR) 40 MG tablet, Take 1 tablet (40 mg total) by mouth at bedtime., Disp: 90 tablet, Rfl: 3 .  cholecalciferol (VITAMIN D) 1000 units tablet, Take 1,000 Units by mouth daily., Disp: , Rfl:  .  gabapentin  (NEURONTIN) 300 MG capsule, Take 1 capsule (300 mg total) by mouth 3 (three) times daily., Disp: 30 capsule, Rfl: 0 .  propranolol (INDERAL) 10 MG tablet, Take 1 tablet (10 mg total) by mouth 3 (three) times daily., Disp: 270 tablet, Rfl: 3 .  telmisartan (MICARDIS) 80 MG tablet, Take 1 tablet (80 mg total) by mouth daily., Disp: 90 tablet, Rfl: 1  Patient Active Problem List   Diagnosis Date Noted  . Obesity (BMI 30.0-34.9) 07/04/2018  . Diastolic dysfunction 00/37/0488  . Left atrial dilation 06/17/2018  . Mitral regurgitation 06/17/2018  . 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 Pulmonary regurgitation 06/17/2018  . Lumbar canal stenosis 12/20/2017  . Abnormal EKG 12/20/2017  . 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 Tremors of nervous system 04/03/2016  . Hyperlipidemia 02/27/2016  . Hyperglycemia 02/27/2016  . Cardiac murmur 02/27/2016  . HBP (high blood pressure) 12/25/2015  . Chronic right-sided low back pain with right-sided sciatica 12/25/2015    Past Surgical History:  Procedure Laterality Date  . ANAL FISSURECTOMY    . COLONOSCOPY WITH PROPOFOL N/A 08/25/2018   Procedure: COLONOSCOPY WITH PROPOFOL;  Surgeon: Jonathon Bellows, MD;  Location: [facility] ENDOSCOPY;  Service: Gastroenterology;  Laterality: N/A;  . HYDROCELE EXCISION Left 03/01/2018   Procedure: HYDROCELECTOMY ADULT;  Surgeon: Abbie Sons, MD;  Location: [facility] ORS;  Service: Urology;  Laterality: Left;    Family History  Problem Relation Age of Onset  . Hypertension Mother   . Dementia Mother   . Angina Mother   . Diabetes Brother   . Hypertension Brother   . Blindness Maternal Grandmother     Social History   Tobacco Use  . Smoking status: Never Smoker  . Smokeless tobacco: Never Used  . Tobacco comment: smoking cessation materials not required  Vaping Use  . Vaping Use: Never used  Substance Use Topics  . Alcohol use: No    Alcohol/week: 0.0 standard drinks  . Drug use: No     Allergies  Allergen Reactions    . Penicillins Rash    Has patient had a PCN reaction causing immediate rash, facial/tongue/throat swelling, SOB or lightheadedness with hypotension: no Has patient had a PCN reaction causing severe rash involving mucus membranes or skin necrosis: no Has patient had a PCN reaction that required hospitalization: no Has patient had a PCN reaction occurring within the last 10 years: no If all of the above answers are "NO", then may proceed with Cephalosporin use.     Health Maintenance  Topic Date Due  . Hepatitis C Screening  Never done  . INFLUENZA VACCINE  04/21/2020  . COLONOSCOPY  08/25/2021  . TETANUS/TDAP  04/11/2022  . COVID-19 Vaccine  Completed  . PNA vac Low Risk Adult  Completed    Chart Review Today: I personally reviewed active problem list, medication list, allergies, family history, social  history, health maintenance, notes from last encounter, lab results, imaging with the patient/caregiver today.   Review of Systems  10 Systems reviewed and are negative for acute change except as noted in the HPI.  Objective:   Vitals:   04/05/20 1429  BP: 132/84  Resp: 18  Temp: 98.1 F (36.7 C)  TempSrc: Temporal  SpO2: 99%  Weight: 232 lb 11.2 oz (105.6 kg)  Height: 6' (1.829 m)    Body mass index is 31.56 kg/m.  Physical Exam Vitals and nursing note reviewed.  Constitutional:      General: He is not in acute distress.    Appearance: Normal appearance. He is well-developed. He is obese. He is not ill-appearing, toxic-appearing or diaphoretic.     Interventions: Face mask in place.  HENT:     Head: Normocephalic and atraumatic.     Jaw: No trismus.     Right Ear: External ear normal.     Left Ear: External ear normal.  Eyes:     General: Lids are normal. No scleral icterus.    Conjunctiva/sclera: Conjunctivae normal.     Pupils: Pupils are equal, round, and reactive to light.  Neck:     Trachea: Trachea and phonation normal. No tracheal deviation.   Cardiovascular:     Rate and Rhythm: Normal rate and regular rhythm.     Pulses: Normal pulses.          Radial pulses are 2+ on the right side and 2+ on the left side.       Posterior tibial pulses are 2+ on the right side and 2+ on the left side.     Heart sounds: Normal heart sounds. No murmur heard.  No friction rub. No gallop.   Pulmonary:     Effort: Pulmonary effort is normal. No respiratory distress.     Breath sounds: Normal breath sounds. No stridor. No wheezing, rhonchi or rales.  Abdominal:     General: Bowel sounds are normal. There is no distension.     Palpations: Abdomen is soft.     Tenderness: There is no abdominal tenderness. There is no guarding or rebound.  Musculoskeletal:        General: Normal range of motion.     Cervical back: Normal range of motion and neck supple.     Right lower leg: No edema.     Left lower leg: No edema.  Skin:    General: Skin is warm and dry.     Capillary Refill: Capillary refill takes less than 2 seconds.     Coloration: Skin is not jaundiced.     Findings: No rash.     Nails: There is no clubbing.  Neurological:     Mental Status: He is alert.     Cranial Nerves: No dysarthria or facial asymmetry.     Motor: No tremor or abnormal muscle tone.     Gait: Gait normal.  Psychiatric:        Mood and Affect: Mood normal.        Speech: Speech normal.        Behavior: Behavior normal. Behavior is cooperative.         Assessment & Plan:     ICD-10-CM   1. Essential hypertension  I10 amLODipine (NORVASC) 10 MG tablet    telmisartan (MICARDIS) 80 MG tablet    COMPLETE METABOLIC PANEL WITH GFR    CANCELED: COMPLETE METABOLIC PANEL WITH GFR   stable well controlled with benicar  and norvasc, last labs normal, pt declined labs today  2. Mixed hyperlipidemia  D31.4 COMPLETE METABOLIC PANEL WITH GFR    Hemoglobin A1c    Lipid panel    CANCELED: COMPLETE METABOLIC PANEL WITH GFR    CANCELED: Hemoglobin A1c   compliant with  statin, no SE or concerns, no myalgias, no claudication sx and no cardiac sx, last lipids well controlled, will repeat in Jan  3. Nonrheumatic mitral valve regurgitation  I34.0   4. Prediabetes  R73.03 Hemoglobin A1c    CANCELED: Hemoglobin A1c   last A1C 6.2, no sx concerning for new onset DM or hyperglycemia, pt has lost weight and feels good, will do labs next OV  5. Obesity (BMI 30.0-34.9)  H88.8 COMPLETE METABOLIC PANEL WITH GFR    Hemoglobin A1c    CANCELED: COMPLETE METABOLIC PANEL WITH GFR    CANCELED: Hemoglobin A1c   some weight loss, encouraged continued healthy diet and lifestyle  6. Tremors of nervous system  R25.1    well controlled on propanolol   7. Chronic right-sided low back pain with right-sided sciatica  M54.41 gabapentin (NEURONTIN) 300 MG capsule   G89.29 Ambulatory referral to Orthopedic Surgery   on gabapentin 300 mg TID - see below - pt requests referral to [facility]  8. Thrombocytopenia (HCC)  D69.6 CBC with Differential/Platelet    CANCELED: CBC with Differential/Platelet   mild platelet abnormality, pt declines labs today, no bleeding concerns, recheck at next OV  9. Encounter for medication monitoring  Z51.81 CBC with Differential/Platelet    COMPLETE METABOLIC PANEL WITH GFR    Hemoglobin A1c    Lipid panel    CANCELED: COMPLETE METABOLIC PANEL WITH GFR    CANCELED: Hemoglobin A1c    CANCELED: CBC with Differential/Platelet  10. Spinal stenosis of lumbar region, unspecified whether neurogenic claudication present  M48.061 Ambulatory referral to Orthopedic Surgery   Pt since I saw him last established with Dr. Netty Starring as PCP - clinic administrator asked to talk to pt prior to me seeing him. I receieved a refill request recently for pt but declined and stated that pt needed to see new PCP - Dr. Carlean Jews  Pt clarifies that this was a misunderstanding, pt will remain pt here at [facility] All meds refilled Pt doing well Referral entered for [facility] - he would  like to go where his wife went (during visit he asked for [facility], but Katharine Look and I believe he meant [facility]) He declined labs today, ordered for prior to next OV.  Encouraged him to f/up sooner if any changes or concerns    Return in about 6 months (around 10/06/2020) for Routine follow-up HLD, HTN, prediabetes.   Delsa Grana, PA-C 04/05/20 2:46 PM

## 2020-04-05 NOTE — Telephone Encounter (Signed)
Patient has appointment today 7/16. It will be refilled at that time

## 2020-07-23 ENCOUNTER — Ambulatory Visit: Payer: Medicare Other

## 2020-08-21 ENCOUNTER — Other Ambulatory Visit: Payer: Self-pay | Admitting: Family Medicine

## 2020-08-21 DIAGNOSIS — G8929 Other chronic pain: Secondary | ICD-10-CM

## 2020-08-21 DIAGNOSIS — M5441 Lumbago with sciatica, right side: Secondary | ICD-10-CM

## 2020-08-21 MED ORDER — GABAPENTIN 300 MG PO CAPS: 300.0000 mg | ORAL_CAPSULE | Freq: Three times a day (TID) | ORAL | 1 refills | Status: DC

## 2020-08-21 NOTE — Telephone Encounter (Signed)
Medication Refill - Medication: Gabapentin   Has the patient contacted their pharmacy? Yes.  Pts wife states that the pt is completely out of this medication. Please advise.  (Agent: If no, request that the patient contact the pharmacy for the refill.) (Agent: If yes, when and what did the pharmacy advise?)  Preferred Pharmacy (with phone number or street name):  [address]  [place] [address]  Phone: [phone] Fax: [phone]  Hours: Not open 24 hours     Agent: Please be advised that RX refills may take up to 3 business days. We ask that you follow-up with your pharmacy.

## 2020-08-21 NOTE — Telephone Encounter (Signed)
Badalamenti,Dianne (wife) requesting a 30 day supply of gabapentin (NEURONTIN) 300 MG capsule, to hold him over until mail order is received. Patient would like to pick up rx today and wife would like a follow up all when completed     [id number] [address], [place] Phone:  [phone]  Fax:  [phone]

## 2020-08-23 ENCOUNTER — Other Ambulatory Visit: Payer: Self-pay

## 2020-08-23 ENCOUNTER — Ambulatory Visit (INDEPENDENT_AMBULATORY_CARE_PROVIDER_SITE_OTHER): Payer: Medicare Other | Admitting: Emergency Medicine

## 2020-08-23 DIAGNOSIS — Z23 Encounter for immunization: Secondary | ICD-10-CM | POA: Diagnosis not present

## 2020-08-23 NOTE — Progress Notes (Signed)
[id number] 

## 2020-09-03 ENCOUNTER — Ambulatory Visit: Payer: Medicare Other

## 2020-09-10 ENCOUNTER — Ambulatory Visit (INDEPENDENT_AMBULATORY_CARE_PROVIDER_SITE_OTHER): Payer: Medicare Other

## 2020-09-10 DIAGNOSIS — Z0000 Encounter for general adult medical examination without abnormal findings: Secondary | ICD-10-CM | POA: Diagnosis not present

## 2020-09-10 NOTE — Progress Notes (Signed)
Subjective:   Jesus Dillon is a 79 y.o. male who presents for Medicare Annual/Subsequent preventive examination.  Virtual Visit via Telephone Note  I connected with  Jesus Dillon on 09/10/20 at  3:30 PM EST by telephone and verified that I am speaking with the correct person using two identifiers.  Location: Patient: home Provider: Woodson Dillon participating in the virtual visit: Jesus Dillon   I discussed the limitations, risks, security and privacy concerns of performing an evaluation and management service by telephone and the availability of in person appointments. The patient expressed understanding and agreed to proceed.  Interactive audio and video telecommunications were attempted between this nurse and patient, however failed, due to patient having technical difficulties OR patient did not have access to video capability.  We continued and completed visit with audio only.  Some vital signs may be absent or patient reported.   Jesus Marker, LPN    Review of Systems     Cardiac Risk Factors include: advanced age (>76men, >25 women);dyslipidemia;male gender;hypertension;obesity (BMI >30kg/m2)     Objective:    There were no vitals filed for this visit. There is no height or weight on file to calculate BMI.  Advanced Directives 09/10/2020 07/20/2019 08/25/2018 06/09/2018 03/01/2018 02/24/2018 06/18/2017  Does Patient Have a Medical Advance Directive? Yes Yes Yes Yes Yes Yes Yes  Type of Paramedic of [place];Living will [place];Living will [place];Living will [place];Living will [place];Living will [place];Living will [place];Living will  Does patient want to make changes to medical advance directive? - - - - No - Patient declined No - Patient declined -  Copy of [place] in Chart? No - copy  requested No - copy requested No - copy requested No - copy requested Yes No - copy requested Yes  Would patient like information on creating a medical advance directive? - - - - - - -    Current Medications (verified) Outpatient Encounter Medications as of 09/10/2020  Medication Sig  . amLODipine (NORVASC) 10 MG tablet Take 1 tablet (10 mg total) by mouth daily.  . Ascorbic Acid (VITAMIN C) 1000 MG tablet Take 1,000 mg by mouth daily.  Marland Kitchen aspirin EC 81 MG tablet Take 1 tablet (81 mg total) by mouth daily. No NSAIDs for at least one hour  . atorvastatin (LIPITOR) 40 MG tablet Take 1 tablet (40 mg total) by mouth at bedtime.  . celecoxib (CELEBREX) 200 MG capsule Take 200 mg by mouth daily.  . cholecalciferol (VITAMIN D) 1000 units tablet Take 1,000 Units by mouth daily.  Marland Kitchen gabapentin (NEURONTIN) 300 MG capsule Take 1 capsule (300 mg total) by mouth 3 (three) times daily.  . propranolol (INDERAL) 10 MG tablet Take 1 tablet (10 mg total) by mouth 3 (three) times daily.  Marland Kitchen telmisartan (MICARDIS) 80 MG tablet Take 1 tablet (80 mg total) by mouth daily.   No facility-administered encounter medications on file as of 09/10/2020.    Allergies (verified) Penicillins   History: Past Medical History:  Diagnosis Date  . Arthritis   . Diastolic dysfunction 6/37/8588   Grade 1, echo Sept 2019  . GERD (gastroesophageal reflux disease)   . Gout   . HBP (high blood pressure)   . Heart murmur   . Hyperlipidemia   . 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 Mitral regurgitation 06/17/2018  Echo Sept 2019  . Pulmonary regurgitation 06/17/2018   Echo Sept 2019  . Tremors of nervous system    Past Surgical History:  Procedure Laterality Date  . ANAL FISSURECTOMY    . COLONOSCOPY WITH PROPOFOL N/A 08/25/2018   Procedure: COLONOSCOPY WITH PROPOFOL;  Surgeon: Jonathon Bellows, MD;  Location[facility] END[facility]COPY;  Service: Gastroenterology;  Laterality: N/A;  . HYDROCELE EXCISION Left 03/01/2018    Procedure: HYDROCELECTOMY ADULT;  Surgeon: Abbie Sons, MD;  Location: [facility] ORS;  Service: Urology;  Laterality: Left;   Family History  Problem Relation Age of Onset  . Hypertension Mother   . Dementia Mother   . Angina Mother   . Diabetes Brother   . Hypertension Brother   . Blindness Maternal Grandmother    Social History   Socioeconomic History  . Marital status: Married    Spouse name: Jesus Dillon  . Number of children: 1  . Years of education: Not on file  . Highest education level: Master's degree (e.g., MA, MS, MEng, MEd, MSW, MBA)  Occupational History  . Occupation: Retired  Tobacco Use  . Smoking status: Never Smoker  . Smokeless tobacco: Never Used  . Tobacco comment: smoking cessation materials not required  Vaping Use  . Vaping Use: Never used  Substance and Sexual Activity  . Alcohol use: No    Alcohol/week: 0.0 standard drinks  . Drug use: No  . Sexual activity: Yes    Partners: Female  Other Topics Concern  . Not on file  Social History Narrative  . Not on file   Social Determinants of Health   Financial Resource Strain: Low Risk   . Difficulty of Paying Living Expenses: Not hard at all  Food Insecurity: No Food Insecurity  . Worried About Charity fundraiser in the Last Year: Never true  . Ran Out of Food in the Last Year: Never true  Transportation Needs: No Transportation Needs  . Lack of Transportation (Medical): No  . Lack of Transportation (Non-Medical): No  Physical Activity: Insufficiently Active  . Days of Exercise per Week: 3 days  . Minutes of Exercise per Session: 30 min  Stress: No Stress Concern Present  . Feeling of Stress : Not at all  Social Connections: Socially Integrated  . Frequency of Communication with Friends and Family: More than three times a week  . Frequency of Social Gatherings with Friends and Family: More than three times a week  . Attends Religious Services: More than 4 times per year  . Active Member of Clubs  or Organizations: Yes  . Attends Archivist Meetings: More than 4 times per year  . Marital Status: Married    Tobacco Counseling Counseling given: Not Answered Comment: smoking cessation materials not required   Clinical Intake:  Pre-visit preparation completed: Yes  Pain : No/denies pain     Nutritional Risks: None Diabetes: No  How often do you need to have someone help you when you read instructions, pamphlets, or other written materials from your doctor or pharmacy?: 1 - Never    Interpreter Needed?: No  Information entered by :: Jesus Marker LPN   Activities of Daily Living In your present state of health, do you have any difficulty performing the following activities: 09/10/2020 04/05/2020  Hearing? N N  Comment declines hearing aids -  Vision? N N  Difficulty concentrating or making decisions? N N  Walking or climbing stairs? N N  Dressing or bathing? N N  Doing errands,  shopping? N N  Preparing Food and eating ? N -  Using the Toilet? N -  In the past six months, have you accidently leaked urine? N -  Do you have problems with loss of bowel control? N -  Managing your Medications? N -  Managing your Finances? N -  Housekeeping or managing your Housekeeping? N -  Some recent data might be hidden    Patient Care Team: Delsa Grana, PA-C as PCP - General (Family Medicine) Minna Merritts, MD as Consulting Physician (Cardiology) Abbie Sons, MD as Consulting Physician (Urology) Thornton Park, MD as Consulting Physician (Orthopedic Surgery)  Indicate any recent Medical Services you may have received from other than [facility] providers in the past year (date may be approximate).     Assessment:   This is a routine wellness examination for Rajendra.  Hearing/Vision screen  Hearing Screening   125Hz  250Hz  500Hz  1000Hz  2000Hz  3000Hz  4000Hz  6000Hz  8000Hz   Right ear:           Left ear:           Comments: Pt denies hearing  difficulty  Vision Screening Comments: [facility] screenings [facility] Dr. Wallace Going  Dietary issues and exercise activities discussed: Current Exercise Habits: Home exercise routine, Type of exercise: Other - see comments (elliptical), Time (Minutes): 30, Frequency (Times/Week): 3, Weekly Exercise (Minutes/Week): 90, Intensity: Moderate, Exercise limited by: orthopedic condition(s)  Goals    . DIET - REDUCE SODIUM INTAKE     Recommend to begin DASH diet (Diet attached to AVS).    . Increase physical activity     Recommend increasing physical activity to 150 minutes per week      Depression Screen PHQ 2/9 Scores 09/10/2020 04/05/2020 10/06/2019 07/20/2019 05/24/2019 02/21/2019 10/18/2018  PHQ - 2 Score 0 0 0 0 0 0 0  PHQ- 9 Score - 0 0 - 0 0 0    Fall Risk Fall Risk  09/10/2020 04/05/2020 10/06/2019 07/20/2019 05/24/2019  Falls in the past year? 0 0 0 0 0  Number falls in past yr: 0 0 0 0 0  Injury with Fall? 0 0 0 0 0  Risk for fall due to : No Fall Risks - - - -  Risk for fall due to: Comment - - - - -  Follow up Falls prevention discussed - - Falls prevention discussed -    FALL RISK PREVENTION PERTAINING TO THE HOME:  Any stairs in or around the home? Yes  If so, are there any without handrails? No  Home free of loose throw rugs in walkways, pet beds, electrical cords, etc? Yes  Adequate lighting in your home to reduce risk of falls? Yes   ASSISTIVE DEVICES UTILIZED TO PREVENT FALLS:  Life alert? No  Use of a cane, walker or w/c? No  Grab bars in the bathroom? Yes  Shower chair or bench in shower? Yes  Elevated toilet seat or a handicapped toilet? Yes   TIMED UP AND GO:  Was the test performed? No . Telephonic visit.   Cognitive Function: Normal cognitive status assessed by direct observation by this Nurse Health Advisor. No abnormalities found.       6CIT Screen 07/20/2019 06/09/2018  What Year? 0 points 0 points  What month? 0 points 0 points  What  time? 0 points 0 points  Count back from 20 0 points 0 points  Months in reverse 0 points 4 points  Repeat phrase 0 points 0  points  Total Score 0 4    Immunizations Immunization History  Administered Date(s) Administered  . Fluad Quad(high Dose 65+) 06/30/2019, 08/23/2020  . Influenza, High Dose Seasonal PF 06/18/2017, 06/06/2018  . PFIZER SARS-COV-2 Vaccination 11/13/2019, 12/04/2019, 07/05/2020  . Pneumococcal Conjugate-13 12/05/2013  . Pneumococcal Polysaccharide-23 04/11/2012  . Tdap 04/11/2012    TDAP status: Up to date   Flu Vaccine status: Up to date  Pneumococcal vaccine status: Up to date  Covid-19 vaccine status: Completed vaccines  Qualifies for Shingles Vaccine? Yes   Zostavax completed No   Shingrix Completed?: No.    Education has been provided regarding the importance of this vaccine. Patient has been advised to call insurance company to determine out of pocket expense if they have not yet received this vaccine. Advised may also receive vaccine at local pharmacy or Health Dept. Verbalized acceptance and understanding.  Screening Tests Health Maintenance  Topic Date Due  . Hepatitis C Screening  Never done  . COLONOSCOPY  08/25/2021  . TETANUS/TDAP  04/11/2022  . INFLUENZA VACCINE  Completed  . COVID-19 Vaccine  Completed  . PNA vac Low Risk Adult  Completed    Health Maintenance  Health Maintenance Due  Topic Date Due  . Hepatitis C Screening  Never done    Colorectal cancer screening: Type of screening: Colonoscopy. Completed 08/25/18. Repeat every 3 years  Lung Cancer Screening: (Low Dose CT Chest recommended if Age 41-80 years, 30 pack-year currently smoking OR have quit w/in 15years.) does not qualify.   Additional Screening:  Hepatitis C Screening: does qualify; postponed  Vision Screening: Recommended annual ophthalmology exams for early detection of glaucoma and other disorders of the eye. Is the patient up to date with their annual eye  exam?  Yes  Who is the provider or what is the name of the office in which the patient attends annual eye exams? [place] Screening: Recommended annual dental exams for proper oral hygiene  Community Resource Referral / Chronic Care Management: CRR required this visit?  No   CCM required this visit?  No      Plan:     I have personally reviewed and noted the following in the patient's chart:   . Medical and social history . Use of alcohol, tobacco or illicit drugs  . Current medications and supplements . Functional ability and status . Nutritional status . Physical activity . Advanced directives . List of other physicians . Hospitalizations, surgeries, and ER visits in previous 12 months . Vitals . Screenings to include cognitive, depression, and falls . Referrals and appointments  In addition, I have reviewed and discussed with patient certain preventive protocols, quality metrics, and best practice recommendations. A written personalized care plan for preventive services as well as general preventive health recommendations were provided to patient.     Jesus Marker, LPN   22/29/7989   Nurse Notes: none

## 2020-09-10 NOTE — Patient Instructions (Signed)
[name] , Thank you for taking time to come for your Medicare Wellness Visit. I appreciate your ongoing commitment to your health goals. Please review the following plan we discussed and let me know if I can assist you in the future.   Screening recommendations/referrals: Colonoscopy: done 08/25/18 Recommended yearly ophthalmology/optometry visit for glaucoma screening and checkup Recommended yearly dental visit for hygiene and checkup  Vaccinations: Influenza vaccine: done 08/23/20 Pneumococcal vaccine: done 12/05/13 Tdap vaccine: done 04/11/12 Shingles vaccine: Shingrix discussed. Please contact your pharmacy for coverage information.  Covid-19: done 11/13/19 & 12/04/19 & 07/05/20  Advanced directives: Please bring a copy of your health care power of attorney and living will to the office at your convenience.  Conditions/risks identified: Keep up the great work!  Next appointment: Follow up in one year for your annual wellness visit.   Preventive Care 52 Years and Older, Male Preventive care refers to lifestyle choices and visits with your health care provider that can promote health and wellness. What does preventive care include?  A yearly physical exam. This is also called an annual well check.  Dental exams once or twice a year.  Routine eye exams. Ask your health care provider how often you should have your eyes checked.  Personal lifestyle choices, including:  Daily care of your teeth and gums.  Regular physical activity.  Eating a healthy diet.  Avoiding tobacco and drug use.  Limiting alcohol use.  Practicing safe sex.  Taking low doses of aspirin every day.  Taking vitamin and mineral supplements as recommended by your health care provider. What happens during an annual well check? The services and screenings done by your health care provider during your annual well check will depend on your age, overall health, lifestyle risk factors, and family history of  disease. Counseling  Your health care provider may ask you questions about your:  Alcohol use.  Tobacco use.  Drug use.  Emotional well-being.  Home and relationship well-being.  Sexual activity.  Eating habits.  History of falls.  Memory and ability to understand (cognition).  Work and work Statistician. Screening  You may have the following tests or measurements:  Height, weight, and BMI.  Blood pressure.  Lipid and cholesterol levels. These may be checked every 5 years, or more frequently if you are over 34 years old.  Skin check.  Lung cancer screening. You may have this screening every year starting at age 44 if you have a 30-pack-year history of smoking and currently smoke or have quit within the past 15 years.  Fecal occult blood test (FOBT) of the stool. You may have this test every year starting at age 55.  Flexible sigmoidoscopy or colonoscopy. You may have a sigmoidoscopy every 5 years or a colonoscopy every 10 years starting at age 49.  Prostate cancer screening. Recommendations will vary depending on your family history and other risks.  Hepatitis C blood test.  Hepatitis B blood test.  Sexually transmitted disease (STD) testing.  Diabetes screening. This is done by checking your blood sugar (glucose) after you have not eaten for a while (fasting). You may have this done every 1-3 years.  Abdominal aortic aneurysm (AAA) screening. You may need this if you are a current or former smoker.  Osteoporosis. You may be screened starting at age 57 if you are at high risk. Talk with your health care provider about your test results, treatment options, and if necessary, the need for more tests. Vaccines  Your health care  provider may recommend certain vaccines, such as:  Influenza vaccine. This is recommended every year.  Tetanus, diphtheria, and acellular pertussis (Tdap, Td) vaccine. You may need a Td booster every 10 years.  Zoster vaccine. You may  need this after age 60.  Pneumococcal 13-valent conjugate (PCV13) vaccine. One dose is recommended after age 1.  Pneumococcal polysaccharide (PPSV23) vaccine. One dose is recommended after age 37. Talk to your health care provider about which screenings and vaccines you need and how often you need them. This information is not intended to replace advice given to you by your health care provider. Make sure you discuss any questions you have with your health care provider. Document Released: 10/04/2015 Document Revised: 05/27/2016 Document Reviewed: 07/09/2015 Elsevier Interactive Patient Education  2017 [place] Prevention in the Home Falls can cause injuries. They can happen to people of all ages. There are many things you can do to make your home safe and to help prevent falls. What can I do on the outside of my home?  Regularly fix the edges of walkways and driveways and fix any cracks.  Remove anything that might make you trip as you walk through a door, such as a raised step or threshold.  Trim any bushes or trees on the path to your home.  Use bright outdoor lighting.  Clear any walking paths of anything that might make someone trip, such as rocks or tools.  Regularly check to see if handrails are loose or broken. Make sure that both sides of any steps have handrails.  Any raised decks and porches should have guardrails on the edges.  Have any leaves, snow, or ice cleared regularly.  Use sand or salt on walking paths during winter.  Clean up any spills in your garage right away. This includes oil or grease spills. What can I do in the bathroom?  Use night lights.  Install grab bars by the toilet and in the tub and shower. Do not use towel bars as grab bars.  Use non-skid mats or decals in the tub or shower.  If you need to sit down in the shower, use a plastic, non-slip stool.  Keep the floor dry. Clean up any water that spills on the floor as soon as it  happens.  Remove soap buildup in the tub or shower regularly.  Attach bath mats securely with double-sided non-slip rug tape.  Do not have throw rugs and other things on the floor that can make you trip. What can I do in the bedroom?  Use night lights.  Make sure that you have a light by your bed that is easy to reach.  Do not use any sheets or blankets that are too big for your bed. They should not hang down onto the floor.  Have a firm chair that has side arms. You can use this for support while you get dressed.  Do not have throw rugs and other things on the floor that can make you trip. What can I do in the kitchen?  Clean up any spills right away.  Avoid walking on wet floors.  Keep items that you use a lot in easy-to-reach places.  If you need to reach something above you, use a strong step stool that has a grab bar.  Keep electrical cords out of the way.  Do not use floor polish or wax that makes floors slippery. If you must use wax, use non-skid floor wax.  Do not have throw  rugs and other things on the floor that can make you trip. What can I do with my stairs?  Do not leave any items on the stairs.  Make sure that there are handrails on both sides of the stairs and use them. Fix handrails that are broken or loose. Make sure that handrails are as long as the stairways.  Check any carpeting to make sure that it is firmly attached to the stairs. Fix any carpet that is loose or worn.  Avoid having throw rugs at the top or bottom of the stairs. If you do have throw rugs, attach them to the floor with carpet tape.  Make sure that you have a light switch at the top of the stairs and the bottom of the stairs. If you do not have them, ask someone to add them for you. What else can I do to help prevent falls?  Wear shoes that:  Do not have high heels.  Have rubber bottoms.  Are comfortable and fit you well.  Are closed at the toe. Do not wear sandals.  If you  use a stepladder:  Make sure that it is fully opened. Do not climb a closed stepladder.  Make sure that both sides of the stepladder are locked into place.  Ask someone to hold it for you, if possible.  Clearly mark and make sure that you can see:  Any grab bars or handrails.  First and last steps.  Where the edge of each step is.  Use tools that help you move around (mobility aids) if they are needed. These include:  Canes.  Walkers.  Scooters.  Crutches.  Turn on the lights when you go into a dark area. Replace any light bulbs as soon as they burn out.  Set up your furniture so you have a clear path. Avoid moving your furniture around.  If any of your floors are uneven, fix them.  If there are any pets around you, be aware of where they are.  Review your medicines with your doctor. Some medicines can make you feel dizzy. This can increase your chance of falling. Ask your doctor what other things that you can do to help prevent falls. This information is not intended to replace advice given to you by your health care provider. Make sure you discuss any questions you have with your health care provider. Document Released: 07/04/2009 Document Revised: 02/13/2016 Document Reviewed: 10/12/2014 Elsevier Interactive Patient Education  2017 [employer].

## 2020-09-18 ENCOUNTER — Other Ambulatory Visit: Payer: Medicare Other

## 2020-09-19 ENCOUNTER — Other Ambulatory Visit: Payer: Medicare Other

## 2020-09-19 DIAGNOSIS — Z20822 Contact with and (suspected) exposure to covid-19: Secondary | ICD-10-CM

## 2020-09-21 LAB — NOVEL CORONAVIRUS, NAA: SARS-CoV-2, NAA: NOT DETECTED

## 2020-09-21 LAB — SARS-COV-2, NAA 2 DAY TAT

## 2020-10-07 ENCOUNTER — Ambulatory Visit: Payer: Medicare Other | Admitting: Family Medicine

## 2020-10-15 ENCOUNTER — Other Ambulatory Visit: Payer: Self-pay | Admitting: Family Medicine

## 2020-10-15 DIAGNOSIS — I10 Essential (primary) hypertension: Secondary | ICD-10-CM

## 2020-10-15 DIAGNOSIS — R251 Tremor, unspecified: Secondary | ICD-10-CM

## 2020-10-22 ENCOUNTER — Other Ambulatory Visit: Payer: Self-pay

## 2020-10-22 ENCOUNTER — Encounter: Payer: Self-pay | Admitting: Family Medicine

## 2020-10-22 ENCOUNTER — Ambulatory Visit: Payer: Medicare Other | Admitting: Family Medicine

## 2020-10-22 VITALS — BP 132/76 | HR 68 | Temp 98.3°F | Resp 18 | Ht 72.0 in | Wt 233.6 lb

## 2020-10-22 DIAGNOSIS — E669 Obesity, unspecified: Secondary | ICD-10-CM

## 2020-10-22 DIAGNOSIS — M48061 Spinal stenosis, lumbar region without neurogenic claudication: Secondary | ICD-10-CM

## 2020-10-22 DIAGNOSIS — I371 Nonrheumatic pulmonary valve insufficiency: Secondary | ICD-10-CM

## 2020-10-22 DIAGNOSIS — E66811 Obesity, class 1: Secondary | ICD-10-CM

## 2020-10-22 DIAGNOSIS — I517 Cardiomegaly: Secondary | ICD-10-CM

## 2020-10-22 DIAGNOSIS — M48062 Spinal stenosis, lumbar region with neurogenic claudication: Secondary | ICD-10-CM

## 2020-10-22 DIAGNOSIS — I10 Essential (primary) hypertension: Secondary | ICD-10-CM

## 2020-10-22 DIAGNOSIS — Z1159 Encounter for screening for other viral diseases: Secondary | ICD-10-CM

## 2020-10-22 DIAGNOSIS — I340 Nonrheumatic mitral (valve) insufficiency: Secondary | ICD-10-CM | POA: Diagnosis not present

## 2020-10-22 DIAGNOSIS — Z5181 Encounter for therapeutic drug level monitoring: Secondary | ICD-10-CM

## 2020-10-22 DIAGNOSIS — R7303 Prediabetes: Secondary | ICD-10-CM

## 2020-10-22 DIAGNOSIS — E782 Mixed hyperlipidemia: Secondary | ICD-10-CM | POA: Diagnosis not present

## 2020-10-22 DIAGNOSIS — I361 Nonrheumatic tricuspid (valve) insufficiency: Secondary | ICD-10-CM

## 2020-10-22 DIAGNOSIS — I5189 Other ill-defined heart diseases: Secondary | ICD-10-CM

## 2020-10-22 DIAGNOSIS — M5441 Lumbago with sciatica, right side: Secondary | ICD-10-CM

## 2020-10-22 DIAGNOSIS — R6889 Other general symptoms and signs: Secondary | ICD-10-CM

## 2020-10-22 DIAGNOSIS — D696 Thrombocytopenia, unspecified: Secondary | ICD-10-CM

## 2020-10-22 DIAGNOSIS — G8929 Other chronic pain: Secondary | ICD-10-CM

## 2020-10-22 DIAGNOSIS — R251 Tremor, unspecified: Secondary | ICD-10-CM

## 2020-10-22 MED ORDER — PROPRANOLOL HCL 10 MG PO TABS: 10.0000 mg | ORAL_TABLET | Freq: Three times a day (TID) | ORAL | 3 refills | Status: DC

## 2020-10-22 NOTE — Progress Notes (Signed)
Name: Jesus Dillon   MRN: [id number]    DOB: 1941/05/17   Date:10/22/2020       Progress Note  Chief Complaint  Patient presents with  . Hypertension  . Hyperlipidemia  . Back Pain    Gabapentin refill     Subjective:   Jesus Dillon is a 80 y.o. male, presents to clinic for routine f/up   Hypertension:  Currently managed on norvasc 10 mg, and micardis 80 mg daily Pt reports good med compliance and denies any SE.   Blood pressure today is well controlled. BP Readings from Last 3 Encounters:  10/22/20 132/76  04/05/20 132/84  10/06/19 134/78   Pt denies CP, SOB, exertional sx, LE edema, palpitation, Ha's, visual disturbances, lightheadedness, hypotension, syncope. Dietary efforts for BP?  Tries to eat healthy  Tremor - managed by propranolol 10 mg TID - has controlled his tremor well, denies exertional sx, SOB, near syncope, fatigue, hx of bradycardia  Prediabetes:  Not on meds, managing with diet/lifestyle efforts Denies: Polyuria, polydipsia, vision changes, neuropathy, hypoglycemia Recent pertinent labs: Lab Results  Component Value Date   HGBA1C 6.2 (H) 10/06/2019   HGBA1C 6.1 (H) 06/09/2018   HGBA1C 6.1 02/27/2016  Not exercising, no weight change  Obesity:  weight stable Wt Readings from Last 5 Encounters:  10/22/20 233 lb 9.6 oz (106 kg)  04/05/20 232 lb 11.2 oz (105.6 kg)  10/06/19 238 lb 9.6 oz (108.2 kg)  07/20/19 232 lb (105.2 kg)  02/21/19 239 lb 8 oz (108.6 kg)   BMI Readings from Last 5 Encounters:  10/22/20 31.68 kg/m  04/05/20 31.56 kg/m  10/06/19 32.36 kg/m  07/20/19 31.46 kg/m  02/21/19 32.48 kg/m    Hyperlipidemia: Currently treated with lipitor 40 mg daily, pt reports good med compliance Last Lipids: Lab Results  Component Value Date   CHOL 147 10/06/2019   HDL 47 10/06/2019   LDLCALC 86 10/06/2019   TRIG 54 10/06/2019   CHOLHDL 3.1 10/06/2019   - Denies: Chest pain, shortness of breath, myalgias,  claudication   Chronic back pain - working with Dr. Sharyne Richters, now Dr. Kayleen Memos - asks for refill on gabapentin, still taking 300 mg TID, manages pain, he's unsure if he's having SE, sometimes forgetful but he thinks that just normal for his age.  Past hx: Chronic back pain with sciatica and hx of spinal stenosis MRI done by Dr. Sanda Klein in early 2020, pt does want referral to specialists at [facility]?, his wife saw specialists and he would like to be referred to them as well. No significant change - managing pain well with gabapentin 300 mg TID No new leg pain, weakness, saddle anesthesia, paresthesias, bladder changes, incontinence of stool or urine. He gets tired when he pushes the [employer], it feels similar to how he used to feel when he ran a lot. Denies exertional leg sx. Prior HPI: Chronic back pain to back started in 2008 - when he lived in [place] is when back pain started he did MRI which showed a disc problem, when hemoved to [place] and est here he was put on gabapentin. He states it works well, over the past couple years he has not noticed any progressive sx, no new numbness, weakness, saddle anesthesia, incontinence. A few times in the morning his back pain and sciatica is "tight" over all. His past PCP did an MRI of lumbar spine about 10 months ago and referred him to neurosurgery/spine due to findings, he never went due to  covid. He would like to est with specialist. MRI reviewed with the pt  MRI done with prior PCP 10/2018:  MR lumbar spine w/o contrast:  IMPRESSION: L2-3: Moderate multifactorial spinal stenosis that could be symptomatic. Foraminal stenosis on the left that could affect the L2 nerve. Discogenic endplate edema could be a cause of back pain.  L3-4: Moderate to severe multifactorial stenosis that could cause neural compression.  L4-5: Severe multifactorial spinal stenosis that could cause neural compression. Degenerative anterolisthesis of 6-7 mm at this  level.  L5-S1: Subarticular lateral recess and foraminal narrowing right more than left that could cause neural compression. The facet arthropathy could contribute to back pain.  On celebrex and baby ASA - he denies any bleeding, spontaneous bruising, blood in stool, melena     Health Maintenance  Topic Date Due  . Hepatitis C Screening  Never done  . COLONOSCOPY (Pts 45-80yrs Insurance coverage will need to be confirmed)  08/25/2021  . TETANUS/TDAP  04/11/2022  . INFLUENZA VACCINE  Completed  . COVID-19 Vaccine  Completed  . PNA vac Low Risk Adult  Completed      Current Outpatient Medications:  .  amLODipine (NORVASC) 10 MG tablet, Take 1 tablet (10 mg total) by mouth daily., Disp: 90 tablet, Rfl: 3 .  Ascorbic Acid (VITAMIN C) 1000 MG tablet, Take 1,000 mg by mouth daily., Disp: , Rfl:  .  aspirin EC 81 MG tablet, Take 1 tablet (81 mg total) by mouth daily. No NSAIDs for at least one hour, Disp: , Rfl:  .  atorvastatin (LIPITOR) 40 MG tablet, Take 1 tablet (40 mg total) by mouth at bedtime., Disp: 90 tablet, Rfl: 3 .  celecoxib (CELEBREX) 200 MG capsule, Take 200 mg by mouth daily., Disp: , Rfl:  .  cholecalciferol (VITAMIN D) 1000 units tablet, Take 1,000 Units by mouth daily., Disp: , Rfl:  .  gabapentin (NEURONTIN) 300 MG capsule, Take 1 capsule (300 mg total) by mouth 3 (three) times daily., Disp: 270 capsule, Rfl: 1 .  propranolol (INDERAL) 10 MG tablet, Take 1 tablet (10 mg total) by mouth 3 (three) times daily., Disp: 270 tablet, Rfl: 3 .  telmisartan (MICARDIS) 80 MG tablet, Take 1 tablet (80 mg total) by mouth daily., Disp: 90 tablet, Rfl: 3  Patient Active Problem List   Diagnosis Date Noted  . Obesity (BMI 30.0-34.9) 07/04/2018  . Diastolic dysfunction 86/57/8469  . Left atrial dilation 06/17/2018  . Mitral regurgitation 06/17/2018  . 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 Pulmonary regurgitation 06/17/2018  . Lumbar canal stenosis 12/20/2017  . Abnormal EKG  12/20/2017  . 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 Tremors of nervous system 04/03/2016  . Hyperlipidemia 02/27/2016  . Hyperglycemia 02/27/2016  . Cardiac murmur 02/27/2016  . HBP (high blood pressure) 12/25/2015  . Chronic right-sided low back pain with right-sided sciatica 12/25/2015    Past Surgical History:  Procedure Laterality Date  . ANAL FISSURECTOMY    . COLONOSCOPY WITH PROPOFOL N/A 08/25/2018   Procedure: COLONOSCOPY WITH PROPOFOL;  Surgeon: Jonathon Bellows, MD;  Location: [facility] ENDOSCOPY;  Service: Gastroenterology;  Laterality: N/A;  . HYDROCELE EXCISION Left 03/01/2018   Procedure: HYDROCELECTOMY ADULT;  Surgeon: Abbie Sons, MD;  Location: [facility] ORS;  Service: Urology;  Laterality: Left;    Family History  Problem Relation Age of Onset  . Hypertension Mother   . Dementia Mother   . Angina Mother   . Diabetes Brother   . Hypertension Brother   . Blindness Maternal  Grandmother     Social History   Tobacco Use  . Smoking status: Never Smoker  . Smokeless tobacco: Never Used  . Tobacco comment: smoking cessation materials not required  Vaping Use  . Vaping Use: Never used  Substance Use Topics  . Alcohol use: No    Alcohol/week: 0.0 standard drinks  . Drug use: No     Allergies  Allergen Reactions  . Penicillins Rash    Has patient had a PCN reaction causing immediate rash, facial/tongue/throat swelling, SOB or lightheadedness with hypotension: no Has patient had a PCN reaction causing severe rash involving mucus membranes or skin necrosis: no Has patient had a PCN reaction that required hospitalization: no Has patient had a PCN reaction occurring within the last 10 years: no If all of the above answers are "NO", then may proceed with Cephalosporin use.     Health Maintenance  Topic Date Due  . Hepatitis C Screening  Never done  . COLONOSCOPY (Pts 45-82yrs Insurance coverage will need to be confirmed)  08/25/2021  . TETANUS/TDAP  04/11/2022  . INFLUENZA  VACCINE  Completed  . COVID-19 Vaccine  Completed  . PNA vac Low Risk Adult  Completed    Chart Review Today: I personally reviewed active problem list, medication list, allergies, family history, social history, health maintenance, notes from last encounter, lab results, imaging with the patient/caregiver today.   Review of Systems  Constitutional: Negative.   HENT: Negative.   Eyes: Negative.   Respiratory: Negative.   Cardiovascular: Negative.   Gastrointestinal: Negative.   Endocrine: Negative.   Genitourinary: Negative.   Musculoskeletal: Negative.   Skin: Negative.   Allergic/Immunologic: Negative.   Neurological: Negative.   Hematological: Negative.   Psychiatric/Behavioral: Negative.   All other systems reviewed and are negative.    Objective:   Vitals:   10/22/20 1326  BP: 132/76  Pulse: 68  Resp: 18  Temp: 98.3 F (36.8 C)  TempSrc: Oral  SpO2: 99%  Weight: 233 lb 9.6 oz (106 kg)  Height: 6' (1.829 m)    Body mass index is 31.68 kg/m.  Physical Exam Vitals and nursing note reviewed.  Constitutional:      General: He is not in acute distress.    Appearance: Normal appearance. He is well-developed. He is obese. He is not ill-appearing, toxic-appearing or diaphoretic.     Interventions: Face mask in place.  HENT:     Head: Normocephalic and atraumatic.     Jaw: No trismus.     Right Ear: External ear normal.     Left Ear: External ear normal.  Eyes:     General: Lids are normal. No scleral icterus.       Right eye: No discharge.        Left eye: No discharge.     Conjunctiva/sclera: Conjunctivae normal.  Neck:     Trachea: Trachea and phonation normal. No tracheal deviation.  Cardiovascular:     Rate and Rhythm: Normal rate and regular rhythm.     Pulses: Normal pulses.          Radial pulses are 2+ on the right side and 2+ on the left side.       Posterior tibial pulses are 2+ on the right side and 2+ on the left side.     Heart sounds:  Normal heart sounds. No murmur heard. No friction rub. No gallop.   Pulmonary:     Effort: Pulmonary effort is normal. No respiratory distress.  Breath sounds: Normal breath sounds. No stridor. No wheezing, rhonchi or rales.  Abdominal:     General: Bowel sounds are normal. There is no distension.     Palpations: Abdomen is soft.  Musculoskeletal:     Right lower leg: No edema.     Left lower leg: No edema.  Skin:    General: Skin is warm and dry.     Coloration: Skin is not jaundiced.     Findings: No rash.     Nails: There is no clubbing.  Neurological:     Mental Status: He is alert. Mental status is at baseline.     Cranial Nerves: No dysarthria or facial asymmetry.     Motor: No tremor or abnormal muscle tone.     Gait: Gait normal.  Psychiatric:        Mood and Affect: Mood normal.        Speech: Speech normal.        Behavior: Behavior normal. Behavior is cooperative.         Assessment & Plan:   1. Essential hypertension Stable, well controlled, BP at goal Continue norvasc and micardis with diet/lifestyle efforts - COMPLETE METABOLIC PANEL WITH GFR - propranolol (INDERAL) 10 MG tablet; Take 1 tablet (10 mg total) by mouth 3 (three) times daily.  Dispense: 270 tablet; Refill: 3  2. Mixed hyperlipidemia Compliant with meds, no SE, no myalgias, fatigue or jaundice Due for annual lipid panel and recheck CMP - has been well controlled Diet and exercise recommendations for HLD reviewed - encouraged to continue meds and diet/lifestyle - COMPLETE METABOLIC PANEL WITH GFR - Lipid panel  3. Nonrheumatic mitral valve regurgitation F/up cardiology - [facility] - lost to f/up  Some exercise intolerance/deconditioning?  No other concerning cardiac sx  4. Prediabetes Weight stable, still working on healthy diet, recheck labs Lab Results  Component Value Date   HGBA1C 6.2 (H) 10/06/2019   Has been stable 6.2 for the past few years - COMPLETE METABOLIC PANEL WITH GFR -  Hemoglobin A1c  5. Obesity (BMI 30.0-34.9) Weight stable - encouraged staying active and healthy foods  6. Tremors of nervous system Well controlled with propranolol 10 mg TID - no SE HR in range  7. Chronic right-sided low back pain with right-sided sciatica Per specialists - refill on gabapentin when due  8. Thrombocytopenia ([place]) monitoring - CBC with Differential/Platelet  9. Encounter for medication monitoring  - CBC with Differential/Platelet - COMPLETE METABOLIC PANEL WITH GFR - Lipid panel - Hemoglobin A1c  10. Spinal stenosis of lumbar region, unspecified whether neurogenic claudication present 11. Spinal stenosis of lumbar region with neurogenic claudication 10 and 11- See #7 - per Dr. Margarita [facility]  12. Diastolic dysfunction F/up cardiology [facility]  13. Encounter for hepatitis C screening test for low risk patient - Hepatitis C Antibody  14. Tremor of right hand Well controlled, refill sent in - propranolol (INDERAL) 10 MG tablet; Take 1 tablet (10 mg total) by mouth 3 (three) times daily.  Dispense: 270 tablet; Refill: 3    Return in about 6 months (around 04/21/2021) for Routine follow-up.   Delsa Grana, PA-C 10/22/20 1:32 PM

## 2020-10-22 NOTE — Patient Instructions (Signed)
You have enough gabapentin refills from December through May - please request when you are near the end of your bottle in May and refills will be sent in.  Health Maintenance  Topic Date Due  .  Hepatitis C: One time screening is recommended by Center for Disease Control  (CDC) for  adults born from [birth year] through 1963.   Never done  . Colon Cancer Screening  08/25/2021  . Tetanus Vaccine  04/11/2022  . Flu Shot  Completed  . COVID-19 Vaccine  Completed  . Pneumonia vaccines  Completed

## 2020-10-23 LAB — CBC WITH DIFFERENTIAL/PLATELET
Absolute Monocytes: 323 cells/uL (ref 200–950)
Basophils Absolute: 40 cells/uL (ref 0–200)
Basophils Relative: 1.2 %
Eosinophils Absolute: 112 cells/uL (ref 15–500)
Eosinophils Relative: 3.4 %
HCT: 44.5 % (ref 38.5–50.0)
Hemoglobin: 14.7 g/dL (ref 13.2–17.1)
Lymphs Abs: 1610 cells/uL (ref 850–3900)
MCH: 28.8 pg (ref 27.0–33.0)
MCHC: 33 g/dL (ref 32.0–36.0)
MCV: 87.1 fL (ref 80.0–100.0)
MPV: 11.8 fL (ref 7.5–12.5)
Monocytes Relative: 9.8 %
Neutro Abs: 1214 cells/uL — ABNORMAL LOW (ref 1500–7800)
Neutrophils Relative %: 36.8 %
Platelets: 135 10*3/uL — ABNORMAL LOW (ref 140–400)
RBC: 5.11 10*6/uL (ref 4.20–5.80)
RDW: 13.2 % (ref 11.0–15.0)
Total Lymphocyte: 48.8 %
WBC: 3.3 10*3/uL — ABNORMAL LOW (ref 3.8–10.8)

## 2020-10-23 LAB — HEMOGLOBIN A1C
Hgb A1c MFr Bld: 6.3 % of total Hgb — ABNORMAL HIGH (ref <5.7)
Mean Plasma Glucose: 134 mg/dL
eAG (mmol/L): 7.4 mmol/L

## 2020-10-23 LAB — COMPLETE METABOLIC PANEL WITH GFR
AG Ratio: 1.3 (calc) (ref 1.0–2.5)
ALT: 41 U/L (ref 9–46)
AST: 36 U/L — ABNORMAL HIGH (ref 10–35)
Albumin: 4.7 g/dL (ref 3.6–5.1)
Alkaline phosphatase (APISO): 58 U/L (ref 35–144)
BUN: 13 mg/dL (ref 7–25)
CO2: 28 mmol/L (ref 20–32)
Calcium: 9.5 mg/dL (ref 8.6–10.3)
Chloride: 104 mmol/L (ref 98–110)
Creat: 1.03 mg/dL (ref 0.70–1.18)
GFR, Est African American: 81 mL/min/{1.73_m2} (ref >=60)
GFR, Est Non African American: 70 mL/min/{1.73_m2} (ref >=60)
Globulin: 3.5 g/dL (calc) (ref 1.9–3.7)
Glucose, Bld: 100 mg/dL — ABNORMAL HIGH (ref 65–99)
Potassium: 4.1 mmol/L (ref 3.5–5.3)
Sodium: 139 mmol/L (ref 135–146)
Total Bilirubin: 0.9 mg/dL (ref 0.2–1.2)
Total Protein: 8.2 g/dL — ABNORMAL HIGH (ref 6.1–8.1)

## 2020-10-23 LAB — LIPID PANEL
Cholesterol: 162 mg/dL (ref <200)
HDL: 60 mg/dL (ref >=40)
LDL Cholesterol (Calc): 89 mg/dL (calc)
Non-HDL Cholesterol (Calc): 102 mg/dL (calc) (ref <130)
Total CHOL/HDL Ratio: 2.7 (calc) (ref <5.0)
Triglycerides: 52 mg/dL (ref <150)

## 2020-10-23 LAB — HEPATITIS C ANTIBODY
Hepatitis C Ab: NONREACTIVE
SIGNAL TO CUT-OFF: 0.01 (ref <1.00)

## 2021-03-07 ENCOUNTER — Other Ambulatory Visit: Payer: Self-pay | Admitting: Family Medicine

## 2021-03-31 ENCOUNTER — Other Ambulatory Visit: Payer: Self-pay | Admitting: Family Medicine

## 2021-03-31 DIAGNOSIS — I10 Essential (primary) hypertension: Secondary | ICD-10-CM

## 2021-04-03 ENCOUNTER — Other Ambulatory Visit: Payer: Self-pay | Admitting: Family Medicine

## 2021-04-03 DIAGNOSIS — I10 Essential (primary) hypertension: Secondary | ICD-10-CM

## 2021-04-21 ENCOUNTER — Ambulatory Visit: Payer: Medicare Other | Admitting: Family Medicine

## 2021-05-12 ENCOUNTER — Other Ambulatory Visit: Payer: Self-pay | Admitting: Family Medicine

## 2021-05-12 DIAGNOSIS — G8929 Other chronic pain: Secondary | ICD-10-CM

## 2021-07-02 ENCOUNTER — Other Ambulatory Visit: Payer: Self-pay | Admitting: Family Medicine

## 2021-07-02 DIAGNOSIS — I10 Essential (primary) hypertension: Secondary | ICD-10-CM

## 2021-07-02 NOTE — Telephone Encounter (Signed)
Requested Prescriptions  Pending Prescriptions Disp Refills  . amLODipine (NORVASC) 10 MG tablet [Pharmacy Med Name: AMLODIPINE BESYLATE TABS 10MG ] 90 tablet 0    Sig: TAKE 1 TABLET DAILY     Cardiovascular:  Calcium Channel Blockers Failed - 07/02/2021  1:31 AM      Failed - Valid encounter within last 6 months    Recent Outpatient Visits          8 months ago Essential hypertension   [facility] Delsa Grana, PA-C   1 year ago Essential hypertension   [facility] Delsa Grana, PA-C   1 year ago Essential hypertension   [facility] Delsa Grana, PA-C   2 years ago Essential hypertension   [place], [place], NP   2 years ago Essential hypertension   [place], [place], NP      Future Appointments            In 2 months [place] BP in normal range    BP Readings from Last 1 Encounters:  10/22/20 132/76

## 2021-07-24 ENCOUNTER — Telehealth: Payer: Self-pay

## 2021-07-24 NOTE — Telephone Encounter (Signed)
Copied from [place] [id number]. Topic: General - Other >> Jul 23, 2021  4:57 PM Bayard Beaver wrote: Reason for UXY:BFXOVAN'V wife called in to state he has alrady had the flu shot and covid shot on 10/26

## 2021-07-24 NOTE — Telephone Encounter (Signed)
I have added flu/COVID vaccines that were done on 07/16/2021.

## 2021-09-16 ENCOUNTER — Other Ambulatory Visit: Payer: Self-pay | Admitting: Family Medicine

## 2021-09-16 ENCOUNTER — Ambulatory Visit: Payer: Medicare Other

## 2021-09-16 DIAGNOSIS — G8929 Other chronic pain: Secondary | ICD-10-CM

## 2021-09-16 NOTE — Telephone Encounter (Signed)
Copied from [place] [id number]. Topic: Quick Communication - Rx Refill/Question >> Sep 16, 2021 10:21 AM Leward Quan A wrote: Medication: gabapentin (NEURONTIN) 300 MG capsule Need 30 day supply needed today   Has the patient contacted their pharmacy? No. Per [facility]  (Agent: If no, request that the patient contact the pharmacy for the refill. If patient does not wish to contact the pharmacy document the reason why and proceed with request.) (Agent: If yes, when and what did the pharmacy advise?)  Preferred Pharmacy (with phone number or street name): [id number] - [place], [place]  Phone:  [phone] Fax:  [phone]    Has the patient been seen for an appointment in the last year OR does the patient have an upcoming appointment? Yes.    Agent: Please be advised that RX refills may take up to 3 business days. We ask that you follow-up with your pharmacy.

## 2021-09-16 NOTE — Telephone Encounter (Signed)
Pt's wife calling back to check on status of medication refill says pt is completely out and will need some to get him until next week when his delivery comes.

## 2021-09-16 NOTE — Telephone Encounter (Signed)
Copied from [place] [id number]. Topic: Quick Communication - Rx Refill/Question >> Sep 16, 2021 12:59 PM Lenon Curt, [place] A wrote: Medication:gabapentin (NEURONTIN) 300 MG capsule [[id number]]   Has the patient contacted their pharmacy? Yes.  The patient's wife was directed to request a 15 day prescription of the medication until their full supply arrives from [facility]  (Agent: If no, request that the patient contact the pharmacy for the refill. If patient does not wish to contact the pharmacy document the reason why and proceed with request.) (Agent: If yes, when and what did the pharmacy advise?)  Preferred Pharmacy (with phone number or street name): [id number] - [place], [place]  Phone:  [phone] Fax:  [phone]  Has the patient been seen for an appointment in the last year OR does the patient have an upcoming appointment? Yes.    Agent: Please be advised that RX refills may take up to 3 business days. We ask that you follow-up with your pharmacy.

## 2021-09-16 NOTE — Telephone Encounter (Signed)
Pts wife is calling to check on the status of the medication request. Advised wife that medication refills can take up to 3 business days.

## 2021-09-16 NOTE — Telephone Encounter (Signed)
Pt's wife called for status update, says the patient is completely out. Wants to know if PCP can provide short supply for 15 days

## 2021-09-17 NOTE — Telephone Encounter (Signed)
Refill was sent in 09/16/21

## 2021-09-30 ENCOUNTER — Other Ambulatory Visit: Payer: Self-pay | Admitting: Family Medicine

## 2021-09-30 ENCOUNTER — Other Ambulatory Visit: Payer: Self-pay

## 2021-09-30 ENCOUNTER — Encounter: Payer: Self-pay | Admitting: Cardiovascular Disease

## 2021-09-30 ENCOUNTER — Ambulatory Visit: Payer: Medicare Other | Admitting: Cardiovascular Disease

## 2021-09-30 VITALS — BP 150/68 | HR 69 | Ht 71.0 in | Wt 241.2 lb

## 2021-09-30 DIAGNOSIS — I340 Nonrheumatic mitral (valve) insufficiency: Secondary | ICD-10-CM | POA: Diagnosis not present

## 2021-09-30 DIAGNOSIS — E782 Mixed hyperlipidemia: Secondary | ICD-10-CM

## 2021-09-30 DIAGNOSIS — I10 Essential (primary) hypertension: Secondary | ICD-10-CM

## 2021-09-30 MED ORDER — AMLODIPINE BESYLATE 10 MG PO TABS: 10.0000 mg | ORAL_TABLET | Freq: Every day | ORAL | 3 refills | Status: DC

## 2021-09-30 MED ORDER — TELMISARTAN 80 MG PO TABS: 80.0000 mg | ORAL_TABLET | Freq: Every day | ORAL | 3 refills | Status: DC

## 2021-09-30 NOTE — Patient Instructions (Addendum)
If leg swelling gets worse, Call the office We would use a diuretic 2-3 days a week if needed  Monitor blood pressure at home Call if elevated >150  Medication Instructions:  No changes  If you need a refill on your cardiac medications before your next appointment, please call your pharmacy.   Lab work: No new labs needed  Testing/Procedures: No new testing needed  Follow-Up: At [facility], you and your health needs are our priority.  As part of our continuing mission to provide you with exceptional heart care, we have created designated Provider Care Teams.  These Care Teams include your primary Cardiologist (physician) and Advanced Practice Providers (APPs -  Physician Assistants and Nurse Practitioners) who all work together to provide you with the care you need, when you need it.  You will need a follow up appointment in 12 months  Providers on your designated Care Team:   Murray Hodgkins, NP Christell Faith, PA-C Cadence Kathlen Mody, [place]  COVID-19 Vaccine Information can be found at: [email or web address].uk For questions related to vaccine distribution or appointments, please email [email or web address] or call [phone].

## 2021-09-30 NOTE — Progress Notes (Signed)
Cardiology Office Note  Date:  09/30/2021   ID:  Jesus Dillon, DOB Mar 23, 1941, MRN [id number]  PCP:  Delsa Grana, PA-C   Chief Complaint  Patient presents with   New Patient (Initial Visit)    Ref by Delsa Grana, PA-C to establish care for HTN; last seen in 2019. Medications reviewed by the patient verbally.     HPI:  [name] is a 81 year old gentleman with past medical history of Nonsmoker Prediabetes HTN Hyperlipidemia Heart murmur/aortic valve sclerosis Benign essential tremor  Who presents by referral from Delsa Grana for mitral valve regurgitation, exercise intolerance  Previously seen in clinic April 2019 Stress test Myoview January 12, 2018 normal ejection fraction low risk study  Echocardiogram September 2019 normal ejection fraction mild to moderate mitral valve regurgitation Mild to moderately dilated left atrium  Feels well,  "Always had high blood pressure  Taking norvasc 10 daily, micardis 80 mg daily  No regular exercise  EKG personally reviewed by myself on todays visit NSR rate 69 bpm nonspecific T wave ABN   PMH:   has a past medical history of Arthritis, Diastolic dysfunction (4/58/0998), GERD (gastroesophageal reflux disease), Gout, HBP (high blood pressure), Heart murmur, Hyperlipidemia, Left atrial dilation (06/17/2018), Mitral regurgitation (06/17/2018), Pulmonary regurgitation (06/17/2018), and Tremors of nervous system.  PSH:    Past Surgical History:  Procedure Laterality Date   ANAL FISSURECTOMY     COLONOSCOPY WITH PROPOFOL N/A 08/25/2018   Procedure: COLONOSCOPY WITH PROPOFOL;  Surgeon: Jonathon Bellows, MD;  Location: [facility] ENDOSCOPY;  Service: Gastroenterology;  Laterality: N/A;   HYDROCELE EXCISION Left 03/01/2018   Procedure: HYDROCELECTOMY ADULT;  Surgeon: Abbie Sons, MD;  Location: [facility] ORS;  Service: Urology;  Laterality: Left;    Current Outpatient Medications  Medication Sig Dispense Refill   amLODipine (NORVASC) 10 MG tablet  TAKE 1 TABLET DAILY 90 tablet 0   Ascorbic Acid (VITAMIN C) 1000 MG tablet Take 1,000 mg by mouth daily.     aspirin EC 81 MG tablet Take 1 tablet (81 mg total) by mouth daily. No NSAIDs for at least one hour     atorvastatin (LIPITOR) 40 MG tablet TAKE 1 TABLET AT BEDTIME 90 tablet 3   celecoxib (CELEBREX) 200 MG capsule Take 200 mg by mouth daily.     cholecalciferol (VITAMIN D) 1000 units tablet Take 1,000 Units by mouth daily.     gabapentin (NEURONTIN) 300 MG capsule TAKE 1 CAPSULE BY MOUTH THREE TIMES A DAY 30 capsule 0   propranolol (INDERAL) 10 MG tablet Take 1 tablet (10 mg total) by mouth 3 (three) times daily. 270 tablet 3   No current facility-administered medications for this visit.     Allergies:   Penicillins   Social History:  The patient  reports that he has never smoked. He has never used smokeless tobacco. He reports that he does not drink alcohol and does not use drugs.   Family History:   family history includes Angina in his mother; Blindness in his maternal grandmother; Dementia in his mother; Diabetes in his brother; Hypertension in his brother and mother.    Review of Systems: Review of Systems  Constitutional: Negative.   Respiratory:  Positive for shortness of breath.   Cardiovascular:  Positive for chest pain.  Gastrointestinal: Negative.   Musculoskeletal: Negative.   Neurological: Negative.   Psychiatric/Behavioral: Negative.    All other systems reviewed and are negative.   PHYSICAL EXAM: VS:  BP (!) 150/68 (BP Location: Right Arm,  Patient Position: Sitting, Cuff Size: Normal)    Pulse 69    Ht 5\' 11"  (1.803 m)    Wt 241 lb 4 oz (109.4 kg)    SpO2 98%    BMI 33.65 kg/m  , BMI Body mass index is 33.65 kg/m. GEN: Well nourished, well developed, in no acute distress  HEENT: normal  Neck: no JVD, carotid bruits, or masses Cardiac: RRR; no murmurs, rubs, or gallops,no edema  Respiratory:  clear to auscultation bilaterally, normal work of  breathing GI: soft, nontender, nondistended, + BS MS: no deformity or atrophy  Skin: warm and dry, no rash Neuro:  Strength and sensation are intact Psych: euthymic mood, full affect  Recent Labs: 10/22/2020: ALT 41; BUN 13; Creat 1.03; Hemoglobin 14.7; Platelets 135; Potassium 4.1; Sodium 139    Lipid Panel Lab Results  Component Value Date   CHOL 162 10/22/2020   HDL 60 10/22/2020   LDLCALC 89 10/22/2020   TRIG 52 10/22/2020      Wt Readings from Last 3 Encounters:  09/30/21 241 lb 4 oz (109.4 kg)  10/22/20 233 lb 9.6 oz (106 kg)  04/05/20 232 lb 11.2 oz (105.6 kg)     ASSESSMENT AND PLAN:  Leg edema From norvasic or fluid retention Declining diuretic at this time  Chest pain with moderate risk for cardiac etiology -  No recent sx  Abnormal EKG -  Nonspecific T wave, no change compared to prior EKGs No further work-up at this time, good exercise tolerance  Cardiac murmur -  No change, will hold off on repeat echocardiogram  Pure hypercholesterolemia Continue statin  Essential hypertension  elevated on arrival  Shortness of breath -  Previously worked up with stress test Denies any significant change Mild symptoms likely secondary to conditioning    Total encounter time more than 60 minutes  Greater than 50% was spent in counseling and coordination of care with the patient    Orders Placed This Encounter  Procedures   EKG 12-Lead     Signed, Esmond Plants, M.D., Ph.D. 09/30/2021  [place], [place]

## 2021-09-30 NOTE — Telephone Encounter (Signed)
Requested Prescriptions  Pending Prescriptions Disp Refills   amLODipine (NORVASC) 10 MG tablet [Pharmacy Med Name: AMLODIPINE BESYLATE TABS 10MG ] 90 tablet 0    Sig: TAKE 1 TABLET DAILY     Cardiovascular:  Calcium Channel Blockers Failed - 09/30/2021 12:13 AM      Failed - Valid encounter within last 6 months    Recent Outpatient Visits          11 months ago Essential hypertension   [facility] Delsa Grana, PA-C   1 year ago Essential hypertension   [facility] Delsa Grana, PA-C   1 year ago Essential hypertension   [facility] Delsa Grana, PA-C   2 years ago Essential hypertension   [place], [place], NP   2 years ago Essential hypertension   [place], [place] Born, NP      Future Appointments            Today Gollan, Kathlene November, MD [facility], [facility]   In 1 week Bo Merino, [facility], [place] BP in normal range    BP Readings from Last 1 Encounters:  10/22/20 132/76

## 2021-10-07 ENCOUNTER — Ambulatory Visit: Payer: Medicare Other | Admitting: Family Medicine

## 2021-10-09 ENCOUNTER — Encounter: Payer: Self-pay | Admitting: Nurse Practitioner

## 2021-10-09 ENCOUNTER — Ambulatory Visit: Payer: Medicare Other | Admitting: Nurse Practitioner

## 2021-10-09 VITALS — BP 134/66 | HR 73 | Temp 98.3°F | Resp 16 | Ht 71.0 in | Wt 240.1 lb

## 2021-10-09 DIAGNOSIS — R7303 Prediabetes: Secondary | ICD-10-CM

## 2021-10-09 DIAGNOSIS — E782 Mixed hyperlipidemia: Secondary | ICD-10-CM

## 2021-10-09 DIAGNOSIS — I10 Essential (primary) hypertension: Secondary | ICD-10-CM | POA: Diagnosis not present

## 2021-10-09 DIAGNOSIS — G8929 Other chronic pain: Secondary | ICD-10-CM

## 2021-10-09 DIAGNOSIS — I340 Nonrheumatic mitral (valve) insufficiency: Secondary | ICD-10-CM

## 2021-10-09 DIAGNOSIS — R251 Tremor, unspecified: Secondary | ICD-10-CM | POA: Diagnosis not present

## 2021-10-09 DIAGNOSIS — M5441 Lumbago with sciatica, right side: Secondary | ICD-10-CM | POA: Diagnosis not present

## 2021-10-09 DIAGNOSIS — R011 Cardiac murmur, unspecified: Secondary | ICD-10-CM

## 2021-10-09 MED ORDER — GABAPENTIN 300 MG PO CAPS: ORAL_CAPSULE | ORAL | 1 refills | Status: DC

## 2021-10-09 MED ORDER — ATORVASTATIN CALCIUM 40 MG PO TABS: 40.0000 mg | ORAL_TABLET | Freq: Every day | ORAL | 3 refills | Status: DC

## 2021-10-09 MED ORDER — PROPRANOLOL HCL 10 MG PO TABS: 10.0000 mg | ORAL_TABLET | Freq: Three times a day (TID) | ORAL | 3 refills | Status: DC

## 2021-10-09 NOTE — Progress Notes (Signed)
BP 134/66    Pulse 73    Temp 98.3 F (36.8 C) (Oral)    Resp 16    Ht 5\' 11"  (1.803 m)    Wt 240 lb 1.6 oz (108.9 kg)    SpO2 99%    BMI 33.49 kg/m    Subjective:    [name], male    DOB: 12/30/1940, 81 y.o.   MRN: [id number]  HPI: Jesus Dillon is a 81 y.o. male  Chief Complaint  Patient presents with   Follow-up   Hypertension   Hyperlipidemia   HTN: He says his blood pressure has always run high he says it is normally around 140/85.  His blood pressure today after recheck ws 134/66.  He denies any chest pain, shortness of breath, headaches or blurred vision.  He is currently taking telmisartan 80 mg daily, and amlodipine 10 mg daily.   Hyperlipidemia:  He says he takes his atorvastatin 40 mg daily.  He denies any myalgia. His last LDL was 89 on 10/22/2020.  Will get labs.  The 10-year ASCVD risk score (Arnett DK, et al., 2019) is: 23.4%   Values used to calculate the score:     Age: 80 years     Sex: Male     Is Non-Hispanic African American: Yes     Diabetic: No     Tobacco smoker: No     Systolic Blood Pressure: 854 mmHg     Is BP treated: Yes     HDL Cholesterol: 60 mg/dL     Total Cholesterol: 162 mg/dL   Tremors: He says he has had tremors for a long time.  He says he takes propranolol and that helps.   Chronic right sided back pain:  He says that he takes gabapentin.  He says that helps. He says his biggest problem is that he can not run like he used to.  Heart murmur/ mitral valve regurgitation : He saw Dr Rockey Situ on 09/30/2021. He had a stress test 01/12/2018 which showed normal ejection fraction. Echocardiogram done 05/2018 also normal ejection fraction, mild to moderate mitral valve regurgitation. EKG performed at cardiologist office which showed  Abnormal EKG -  Nonspecific T wave, no change compared to prior EKGs  Relevant past medical, surgical, family and social history reviewed and updated as indicated. Interim medical history since our last  visit reviewed. Allergies and medications reviewed and updated.  Review of Systems  Constitutional: Negative for fever or weight change.  Respiratory: Negative for cough and shortness of breath.   Cardiovascular: Negative for chest pain or palpitations.  Gastrointestinal: Negative for abdominal pain, no bowel changes.  Musculoskeletal: Negative for gait problem or joint swelling.  Skin: Negative for rash.  Neurological: Negative for dizziness or headache.  No other specific complaints in a complete review of systems (except as listed in HPI above).      Objective:    BP 134/66    Pulse 73    Temp 98.3 F (36.8 C) (Oral)    Resp 16    Ht 5\' 11"  (1.803 m)    Wt 240 lb 1.6 oz (108.9 kg)    SpO2 99%    BMI 33.49 kg/m   Wt Readings from Last 3 Encounters:  09/24/2021 240 lb 1.6 oz (108.9 kg)  09/30/21 241 lb 4 oz (109.4 kg)  10/22/20 233 lb 9.6 oz (106 kg)    Physical Exam  Constitutional: Patient appears well-developed and well-nourished. Obese  No  distress.  HEENT: head atraumatic, normocephalic, pupils equal and reactive to light,  neck supple Cardiovascular: Normal rate, regular rhythm and normal heart sounds.  No murmur heard. Edema noted to lower extremities +1. Pulmonary/Chest: Effort normal and breath sounds normal. No respiratory distress. Abdominal: Soft.  There is no tenderness. Psychiatric: Patient has a normal mood and affect. behavior is normal. Judgment and thought content normal.   Results for orders placed or performed in visit on 10/22/20  CBC with Differential/Platelet  Result Value Ref Range   WBC 3.3 (L) 3.8 - 10.8 Thousand/uL   RBC 5.11 4.20 - 5.80 Million/uL   Hemoglobin 14.7 13.2 - 17.1 g/dL   HCT 44.5 38.5 - 50.0 %   MCV 87.1 80.0 - 100.0 fL   MCH 28.8 27.0 - 33.0 pg   MCHC 33.0 32.0 - 36.0 g/dL   RDW 13.2 11.0 - 15.0 %   Platelets 135 (L) 140 - 400 Thousand/uL   MPV 11.8 7.5 - 12.5 fL   Neutro Abs 1,214 (L) 1,500 - 7,800 cells/uL   Lymphs Abs 1,610  850 - 3,900 cells/uL   Absolute Monocytes 323 200 - 950 cells/uL   Eosinophils Absolute 112 15 - 500 cells/uL   Basophils Absolute 40 0 - 200 cells/uL   Neutrophils Relative % 36.8 %   Total Lymphocyte 48.8 %   Monocytes Relative 9.8 %   Eosinophils Relative 3.4 %   Basophils Relative 1.2 %  COMPLETE METABOLIC PANEL WITH GFR  Result Value Ref Range   Glucose, Bld 100 (H) 65 - 99 mg/dL   BUN 13 7 - 25 mg/dL   Creat 1.03 0.70 - 1.18 mg/dL   GFR, Est Non African American 70 > OR = 60 mL/min/1.90m2   GFR, Est African American 81 > OR = 60 mL/min/1.72m2   BUN/Creatinine Ratio NOT APPLICABLE 6 - 22 (calc)   Sodium 139 135 - 146 mmol/L   Potassium 4.1 3.5 - 5.3 mmol/L   Chloride 104 98 - 110 mmol/L   CO2 28 20 - 32 mmol/L   Calcium 9.5 8.6 - 10.3 mg/dL   Total Protein 8.2 (H) 6.1 - 8.1 g/dL   Albumin 4.7 3.6 - 5.1 g/dL   Globulin 3.5 1.9 - 3.7 g/dL (calc)   AG Ratio 1.3 1.0 - 2.5 (calc)   Total Bilirubin 0.9 0.2 - 1.2 mg/dL   Alkaline phosphatase (APISO) 58 35 - 144 U/L   AST 36 (H) 10 - 35 U/L   ALT 41 9 - 46 U/L  Lipid panel  Result Value Ref Range   Cholesterol 162 <200 mg/dL   HDL 60 > OR = 40 mg/dL   Triglycerides 52 <150 mg/dL   LDL Cholesterol (Calc) 89 mg/dL (calc)   Total CHOL/HDL Ratio 2.7 <5.0 (calc)   Non-HDL Cholesterol (Calc) 102 <130 mg/dL (calc)  Hemoglobin A1c  Result Value Ref Range   Hgb A1c MFr Bld 6.3 (H) <5.7 % of total Hgb   Mean Plasma Glucose 134 mg/dL   eAG (mmol/L) 7.4 mmol/L  Hepatitis C Antibody  Result Value Ref Range   Hepatitis C Ab NON-REACTIVE NON-REACTI   SIGNAL TO CUT-OFF 0.01 <1.00      Assessment & Plan:   1. Essential hypertension  - propranolol (INDERAL) 10 MG tablet; Take 1 tablet (10 mg total) by mouth 3 (three) times daily.  Dispense: 270 tablet; Refill: 3 - CBC with Differential/Platelet - COMPLETE METABOLIC PANEL WITH GFR  2. Mixed hyperlipidemia  - atorvastatin (LIPITOR) 40  MG tablet; Take 1 tablet (40 mg total) by  mouth at bedtime.  Dispense: 90 tablet; Refill: 3 - Lipid panel - COMPLETE METABOLIC PANEL WITH GFR  3. Tremors of nervous system  - propranolol (INDERAL) 10 MG tablet; Take 1 tablet (10 mg total) by mouth 3 (three) times daily.  Dispense: 270 tablet; Refill: 3  4. Chronic right-sided low back pain with right-sided sciatica  - gabapentin (NEURONTIN) 300 MG capsule; TAKE 1 CAPSULE BY MOUTH THREE TIMES A DAY  Dispense: 90 capsule; Refill: 1  5. Cardiac murmur  -continue to follow-up with cardiology  6. Nonrheumatic mitral valve regurgitation -continue to follow-up with cardiology  7. Prediabetes  - COMPLETE METABOLIC PANEL WITH GFR - Hemoglobin A1c   Follow up plan: Return in about 6 months (around 04/08/2022) for follow up.

## 2021-10-21 ENCOUNTER — Ambulatory Visit: Payer: Medicare Other | Admitting: Nurse Practitioner

## 2021-10-21 ENCOUNTER — Encounter: Payer: Self-pay | Admitting: Nurse Practitioner

## 2021-10-21 ENCOUNTER — Other Ambulatory Visit: Payer: Self-pay

## 2021-10-21 VITALS — BP 126/82 | HR 69 | Temp 98.3°F | Resp 16 | Ht 71.0 in | Wt 232.0 lb

## 2021-10-21 DIAGNOSIS — J069 Acute upper respiratory infection, unspecified: Secondary | ICD-10-CM | POA: Diagnosis not present

## 2021-10-21 DIAGNOSIS — U071 COVID-19: Secondary | ICD-10-CM | POA: Diagnosis not present

## 2021-10-21 LAB — POCT INFLUENZA A/B
Influenza A, POC: NEGATIVE
Influenza B, POC: NEGATIVE

## 2021-10-21 NOTE — Progress Notes (Signed)
° °  BP 126/82    Pulse 69    Temp 98.3 F (36.8 C) (Oral)    Resp 16    Ht 5\' 11"  (1.803 m)    Wt 232 lb (105.2 kg)    SpO2 99%    BMI 32.36 kg/m    Subjective:    [name], male    DOB: 1941/06/08, 81 y.o.   MRN: [id number]  HPI: Jesus Dillon is a 81 y.o. male  Chief Complaint  Patient presents with   Covid Positive    Was checked 5 days ago   Covid-19: He says that on Wednesday he started feeling fatigue and had a cough. He says he did a home Covid test and it was positive. He says he is feeling much better. He says he still has a cough.  He denies any fever or shortness of breath.  Discussed continuing pushing fluids. He says he has been taking Nyquil for his cough.  Discussed OTC medications for symptoms.  He has already by quarantining. He does want to get a flu test just to make sure he does not have the flu too.  He does want a work note to go back to work.    Relevant past medical, surgical, family and social history reviewed and updated as indicated. Interim medical history since our last visit reviewed. Allergies and medications reviewed and updated.  Review of Systems  Constitutional: Negative for fever or weight change.  Respiratory: Positive for cough and negative for shortness of breath.   Cardiovascular: Negative for chest pain or palpitations.  Gastrointestinal: Negative for abdominal pain, no bowel changes.  Musculoskeletal: Negative for gait problem or joint swelling.  Skin: Negative for rash.  Neurological: Negative for dizziness or headache.  No other specific complaints in a complete review of systems (except as listed in HPI above).      Objective:    BP 126/82    Pulse 69    Temp 98.3 F (36.8 C) (Oral)    Resp 16    Ht 5\' 11"  (1.803 m)    Wt 232 lb (105.2 kg)    SpO2 99%    BMI 32.36 kg/m   Wt Readings from Last 3 Encounters:  10/21/21 232 lb (105.2 kg)  10/21/2021 240 lb 1.6 oz (108.9 kg)  09/30/21 241 lb 4 oz (109.4 kg)    Physical  Exam  Constitutional: Patient appears well-developed and well-nourished. Obese  No distress.  HEENT: head atraumatic, normocephalic, pupils equal and reactive to light, ears TMs clear, neck supple, throat within normal limits Cardiovascular: Normal rate, regular rhythm and normal heart sounds.  No murmur heard. No BLE edema. Pulmonary/Chest: Effort normal and breath sounds normal. No respiratory distress. Abdominal: Soft.  There is no tenderness. Psychiatric: Patient has a normal mood and affect. behavior is normal. Judgment and thought content normal.   Results for orders placed or performed in visit on 10/21/21  POCT Influenza A/B  Result Value Ref Range   Influenza A, POC Negative Negative   Influenza B, POC Negative Negative      Assessment & Plan:   1. COVID-19 -continue to push fluids, get rest -discussed OTC treatments for symptoms  2. Viral upper respiratory tract infection  - POCT Influenza A/B    Follow up plan: Return if symptoms worsen or fail to improve.

## 2021-10-22 DEATH — deceased

## 2022-01-01 ENCOUNTER — Ambulatory Visit: Payer: Medicare Other | Admitting: Family Medicine

## 2022-01-01 VITALS — BP 134/72 | HR 70 | Temp 98.3°F | Resp 16 | Ht 71.5 in | Wt 234.4 lb

## 2022-01-01 DIAGNOSIS — J310 Chronic rhinitis: Secondary | ICD-10-CM

## 2022-01-01 DIAGNOSIS — J329 Chronic sinusitis, unspecified: Secondary | ICD-10-CM

## 2022-01-01 DIAGNOSIS — R519 Headache, unspecified: Secondary | ICD-10-CM

## 2022-01-01 DIAGNOSIS — D696 Thrombocytopenia, unspecified: Secondary | ICD-10-CM | POA: Diagnosis not present

## 2022-01-01 MED ORDER — MOMETASONE FUROATE 50 MCG/ACT NA SUSP: 2.0000 | Freq: Every day | NASAL | 12 refills | Status: DC

## 2022-01-01 NOTE — Progress Notes (Signed)
? ? ?[name], male    DOB: 1941-08-13, 81 y.o.   MRN: [id number] ? ?PCP: Bo Merino, FNP ? ?Chief Complaint  ?Patient presents with  ? Headache  ?  Mainly left side radiates to eye, ear and even throat. Pt has had history of migraines in the past. Pt was taken prednisone for a month not sure if its related to it.  ? ? ?Subjective:  ? ?Jesus Dillon is a 82 y.o. male, presents to clinic with CC of the following: ? ?Headache  ?This is a new problem. The current episode started in the past 7 days. The problem occurs constantly. The pain is located in the Left unilateral, parietal and occipital region. Radiates to: left scalp temple eye. The pain quality is not similar to prior headaches. The pain is at a severity of 7/10. The pain is moderate. Associated symptoms include drainage, rhinorrhea, scalp tenderness, sinus pressure, a sore throat and tinnitus. Pertinent negatives include no abdominal pain, anorexia, back pain, blurred vision, coughing, dizziness, ear pain, eye pain, eye redness, eye watering, fever, loss of balance, muscle aches, nausea, neck pain, numbness, phonophobia, photophobia or vomiting. He has tried acetaminophen for the symptoms. The treatment provided mild relief. His past medical history is significant for hypertension and migraine headaches.   ?HA/skin sensitivity to left scalp with recent rash, states the pain can move around ?Started 3 d ago  ?He has no scalp or skin changes in areas of hypersensitivity, no redness ?No temple pain ?He does have some sinus pain and pressure does seem to be causing some of the pain in his eye and his left sinus and ear with occasional swallowing discomfort but he has not had any coughing, fever, chills, sweats ?He has no known sick contacts ?He tried a nasal spray and it did seem to help for a little bit ?He had migraines as a child, this headache does not feel like what his migraines used to be like they were debilitating with photophobia,  photophobia nausea and he would have to lie in a dark room to get to go away ? ? ? ?Patient Active Problem List  ? Diagnosis Date Noted  ? Thrombocytopenia ([place]) 03/19/2020  ? Benign essential tremor 02/01/2020  ? Pure hypercholesterolemia 02/01/2020  ? Sciatica of left side 02/01/2020  ? Obesity (BMI 30.0-34.9) 07/04/2018  ? Diastolic dysfunction 37/06/6268  ? 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 Mitral regurgitation 06/17/2018  ? 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 Pulmonary regurgitation 06/17/2018  ? Lumbar canal stenosis 12/20/2017  ? Abnormal EKG 12/20/2017  ? Chronic gout 05/19/2016  ? Tremors of nervous system 04/03/2016  ? Hyperlipidemia 02/27/2016  ? Hyperglycemia 02/27/2016  ? Cardiac murmur 02/27/2016  ? HBP (high blood pressure) 12/25/2015  ? Chronic right-sided low back pain with right-sided sciatica 12/25/2015  ? ? ? ? ?Current Outpatient Medications:  ?  amLODipine (NORVASC) 10 MG tablet, Take 1 tablet (10 mg total) by mouth daily., Disp: 90 tablet, Rfl: 3 ?  Ascorbic Acid (VITAMIN C) 1000 MG tablet, Take 1,000 mg by mouth daily., Disp: , Rfl:  ?  aspirin EC 81 MG tablet, Take 1 tablet (81 mg total) by mouth daily. No NSAIDs for at least one hour, Disp: , Rfl:  ?  atorvastatin (LIPITOR) 40 MG tablet, Take 1 tablet (40 mg total) by mouth at bedtime., Disp: 90 tablet, Rfl: 3 ?  cholecalciferol (VITAMIN D) 1000 units tablet, Take 1,000 Units by mouth daily., Disp: ,  Rfl:  ?  gabapentin (NEURONTIN) 300 MG capsule, TAKE 1 CAPSULE BY MOUTH THREE TIMES A DAY, Disp: 90 capsule, Rfl: 1 ?  propranolol (INDERAL) 10 MG tablet, Take 1 tablet (10 mg total) by mouth 3 (three) times daily., Disp: 270 tablet, Rfl: 3 ?  telmisartan (MICARDIS) 80 MG tablet, Take 1 tablet (80 mg total) by mouth daily., Disp: 90 tablet, Rfl: 3 ?  triamcinolone cream (KENALOG) 0.1 %, Apply 1 application. topically 2 (two) times daily., Disp: , Rfl:  ?  celecoxib (CELEBREX) 100 MG capsule, Take 100 mg by mouth 2 (two) times daily. (Patient  not taking: Reported on 01/01/2022), Disp: , Rfl:  ?  celecoxib (CELEBREX) 200 MG capsule, Take 200 mg by mouth daily. (Patient not taking: Reported on 01/01/2022), Disp: , Rfl:  ? ? ?Allergies  ?Allergen Reactions  ? Penicillins Rash  ?  Has patient had a PCN reaction causing immediate rash, facial/tongue/throat swelling, SOB or lightheadedness with hypotension: no ?Has patient had a PCN reaction causing severe rash involving mucus membranes or skin necrosis: no ?Has patient had a PCN reaction that required hospitalization: no ?Has patient had a PCN reaction occurring within the last 10 years: no ?If all of the above answers are "NO", then may proceed with Cephalosporin use. ?  ? ? ? ?Social History  ? ?Tobacco Use  ? Smoking status: Never  ? Smokeless tobacco: Never  ? Tobacco comments:  ?  smoking cessation materials not required  ?Vaping Use  ? Vaping Use: Never used  ?Substance Use Topics  ? Alcohol use: No  ?  Alcohol/week: 0.0 standard drinks  ? Drug use: No  ?  ? ? ?Chart Review Today: ?I personally reviewed active problem list, medication list, allergies, family history, social history, health maintenance, notes from last encounter, lab results, imaging with the patient/caregiver today. ? ? ?Review of Systems  ?Constitutional: Negative.  Negative for fever.  ?HENT:  Positive for rhinorrhea, sinus pressure, sore throat and tinnitus. Negative for ear pain.   ?Eyes: Negative.  Negative for blurred vision, photophobia, pain and redness.  ?Respiratory: Negative.  Negative for cough.   ?Cardiovascular: Negative.   ?Gastrointestinal: Negative.  Negative for abdominal pain, anorexia, nausea and vomiting.  ?Endocrine: Negative.   ?Genitourinary: Negative.   ?Musculoskeletal: Negative.  Negative for back pain and neck pain.  ?Skin: Negative.   ?Allergic/Immunologic: Negative.   ?Neurological:  Positive for headaches. Negative for dizziness, numbness and loss of balance.  ?Hematological: Negative.    ?Psychiatric/Behavioral: Negative.    ?All other systems reviewed and are negative. ? ?   ?Objective:  ? ?Vitals:  ? 01/01/22 1322  ?BP: 134/72  ?Pulse: 70  ?Resp: 16  ?Temp: 98.3 ?F (36.8 ?C)  ?TempSrc: Oral  ?SpO2: 98%  ?Weight: 234 lb 6.4 oz (106.3 kg)  ?Height: 5' 11.5" (1.816 m)  ?  ?Body mass index is 32.24 kg/m?. ? ?Physical Exam ?Vitals and nursing note reviewed.  ?Constitutional:   ?   General: He is not in acute distress. ?   Appearance: Normal appearance. He is well-developed and well-groomed. He is obese. He is not ill-appearing, toxic-appearing or diaphoretic.  ?HENT:  ?   Head: Normocephalic and atraumatic. No right periorbital erythema or left periorbital erythema.  ?   Jaw: No trismus or tenderness.  ?   Salivary Glands: Right salivary gland is not diffusely enlarged or tender. Left salivary gland is not diffusely enlarged or tender.  ?   Comments: No tenderness to palpation  to bilateral temples and frontal and maxillary sinuses ?Bald on top of scalp with some healing rash right at the crown and no edema or erythema noted, some tenderness to light palpation to left parietal scalp ?   Right Ear: Tympanic membrane, ear canal and external ear normal.  ?   Left Ear: Tympanic membrane, ear canal and external ear normal.  ?   Nose: Mucosal edema, congestion and rhinorrhea present. Rhinorrhea is clear.  ?   Right Turbinates: Swollen. Not pale.  ?   Left Turbinates: Swollen. Not pale.  ?   Right Sinus: No maxillary sinus tenderness or frontal sinus tenderness.  ?   Left Sinus: No maxillary sinus tenderness or frontal sinus tenderness.  ?   Mouth/Throat:  ?   Mouth: Mucous membranes are not pale, not dry and not cyanotic.  ?   Pharynx: Uvula midline. Posterior oropharyngeal erythema present. No oropharyngeal exudate or uvula swelling.  ?   Tonsils: No tonsillar exudate or tonsillar abscesses.  ?Eyes:  ?   General: Lids are normal. No scleral icterus.    ?   Right eye: No discharge.     ?   Left eye: No  discharge.  ?   Extraocular Movements: Extraocular movements intact.  ?   Conjunctiva/sclera: Conjunctivae normal.  ?   Pupils: Pupils are equal, round, and reactive to light.  ?Neck:  ?   Trachea: Trachea and phonation normal

## 2022-01-01 NOTE — Patient Instructions (Addendum)
Use an over the counter antihistamine daily at bedtime - like generic zyrtec, claritin, allegra or xyzal  ?And do the steroid intranasal spray 2 sprays each nostril once daily ? ?Use tylenol or over the counter ibuprofen or excedrine migraine as needed for head ache release ? ?If you suddenly worsen with severe pain, tenderness to sinuses with fever, or this last more than 10-12 days, then you may need to start antibiotics ? ?Today I do not feel antibiotics are indicated and the other treatments above will likely help  ?

## 2022-01-12 ENCOUNTER — Ambulatory Visit: Payer: Self-pay

## 2022-01-12 NOTE — Telephone Encounter (Signed)
?  Chief Complaint: headache ?Symptoms: headache on L side of head affected face, nose, and mouth ?Frequency: 3 weeks or more ?Pertinent Negatives: NA ?Disposition: '[]'$ ED /'[]'$ Urgent Care (no appt availability in office) / '[]'$ Appointment(In office/virtual)/ '[]'$  [place] [facility]/ '[]'$ Home Care/ '[]'$ Refused Recommended Disposition /'[]'$ [place] [facility]/ '[x]'$  Follow-up with PCP ?Additional Notes: pt had OV on 01/01/22 with Kristeen Miss, PA. He states that he has tried everything that she prescribed and OTC and nothing helps. He is requesting a referral for xray and neurology to see what is going on because he feels like this is neurological. He can touch his head and have a sharp shooting pain on the L side face. Pt refused to come in for appt to f/up. States he has an appt on 01/19/22. I advised him that I would send message to provider for referral request.  ? ?Summary: Headaches  ? Pt called and stated that he is still having headaches and would like a call back from a nurse to give clinical info.   ?  ? ?Reason for Disposition ? [1] MODERATE headache (e.g., interferes with normal activities) AND [2] present > 24 hours AND [3] unexplained  (Exceptions: analgesics not tried, typical migraine, or headache part of viral illness) ? ?Answer Assessment - Initial Assessment Questions ?1. LOCATION: "Where does it hurt?"  ?    L side affected  ?2. ONSET: "When did the headache start?" (Minutes, hours or days)  ?    3 weeks  ?3. PATTERN: "Does the pain come and go, or has it been constant since it started?" ?    Comes and goes  ?4. SEVERITY: "How bad is the pain?" and "What does it keep you from doing?"  (e.g., Scale 1-10; mild, moderate, or severe) ?  - MILD (1-3): doesn't interfere with normal activities  ?  - MODERATE (4-7): interferes with normal activities or awakens from sleep  ?  - SEVERE (8-10): excruciating pain, unable to do any normal activities    ?    When present 10 ?9. OTHER SYMPTOMS: "Do you have any other symptoms?"  (fever, stiff neck, eye pain, sore throat, cold symptoms) ?    Tenderness ? ?Protocols used: Headache-A-AH ? ?

## 2022-01-13 ENCOUNTER — Other Ambulatory Visit: Payer: Self-pay | Admitting: Nurse Practitioner

## 2022-01-13 DIAGNOSIS — R519 Headache, unspecified: Secondary | ICD-10-CM

## 2022-01-19 ENCOUNTER — Ambulatory Visit: Payer: Medicare Other | Admitting: Family Medicine

## 2022-01-23 ENCOUNTER — Other Ambulatory Visit: Payer: Self-pay | Admitting: Physician Assistant

## 2022-01-23 DIAGNOSIS — H53149 Visual discomfort, unspecified: Secondary | ICD-10-CM

## 2022-01-23 DIAGNOSIS — R519 Headache, unspecified: Secondary | ICD-10-CM

## 2022-01-23 DIAGNOSIS — H93A3 Pulsatile tinnitus, bilateral: Secondary | ICD-10-CM

## 2022-01-23 DIAGNOSIS — R6884 Jaw pain: Secondary | ICD-10-CM

## 2022-01-29 ENCOUNTER — Encounter (INDEPENDENT_AMBULATORY_CARE_PROVIDER_SITE_OTHER): Payer: Medicare Other | Admitting: Vascular Surgery

## 2022-02-18 ENCOUNTER — Telehealth: Payer: Self-pay | Admitting: Nurse Practitioner

## 2022-02-18 ENCOUNTER — Other Ambulatory Visit: Payer: Self-pay | Admitting: Nurse Practitioner

## 2022-02-18 DIAGNOSIS — G8929 Other chronic pain: Secondary | ICD-10-CM

## 2022-02-18 NOTE — Telephone Encounter (Signed)
Medication Refill - Medication: gabapentin (NEURONTIN) 300 MG capsule  Has the patient contacted their pharmacy? Yes.   (Agent: If no, request that the patient contact the pharmacy for the refill. If patient does not wish to contact the pharmacy document the reason why and proceed with request.) (Agent: If yes, when and what did the pharmacy advise?)  Preferred Pharmacy (with phone number or street name):  [place], [address]  [place] [address]  Phone: [phone] Fax: [phone]   Has the patient been seen for an appointment in the last year OR does the patient have an upcoming appointment? Yes.    Agent: Please be advised that RX refills may take up to 3 business days. We ask that you follow-up with your pharmacy.

## 2022-02-19 MED ORDER — GABAPENTIN 300 MG PO CAPS: ORAL_CAPSULE | ORAL | 1 refills | Status: DC

## 2022-02-19 NOTE — Telephone Encounter (Signed)
Medication was refilled today by PCP, will refuse this duplicate request.  Requested Prescriptions  Pending Prescriptions Disp Refills  . gabapentin (NEURONTIN) 300 MG capsule [Pharmacy Med Name: GABAPENTIN CAPS '300MG'$ ] 90 capsule 11    Sig: TAKE 1 CAPSULE THREE TIMES A DAY     Neurology: Anticonvulsants - gabapentin Failed - 02/18/2022  1:43 PM      Failed - Cr in normal range and within 360 days    Creat  Date Value Ref Range Status  10/22/2020 1.03 0.70 - 1.18 mg/dL Final    Comment:    For patients >48 years of age, the reference limit for Creatinine is approximately 13% higher for people identified as African-American. Renella Cunas - Completed PHQ-2 or PHQ-9 in the last 360 days      Passed - Valid encounter within last 12 months    Recent Outpatient Visits          1 month ago Acute nonintractable headache, unspecified headache type   [facility] Delsa Grana, PA-C   4 months ago COVID-19   [place], FNP   4 months ago Essential hypertension   [facility] Bo Merino, FNP   1 year ago Essential hypertension   [facility] Delsa Grana, PA-C   1 year ago Essential hypertension   [facility] Delsa Grana, PA-C      Future Appointments            In 1 month Reece Packer, Myna Hidalgo, [facility], [facility]

## 2022-02-19 NOTE — Telephone Encounter (Signed)
Requested Prescriptions  Pending Prescriptions Disp Refills  . gabapentin (NEURONTIN) 300 MG capsule 90 capsule 1    Sig: TAKE 1 CAPSULE BY MOUTH THREE TIMES A DAY     Neurology: Anticonvulsants - gabapentin Failed - 02/18/2022  1:39 PM      Failed - Cr in normal range and within 360 days    Creat  Date Value Ref Range Status  10/22/2020 1.03 0.70 - 1.18 mg/dL Final    Comment:    For patients >79 years of age, the reference limit for Creatinine is approximately 13% higher for people identified as African-American. Renella Cunas - Completed PHQ-2 or PHQ-9 in the last 360 days      Passed - Valid encounter within last 12 months    Recent Outpatient Visits          1 month ago Acute nonintractable headache, unspecified headache type   [facility] Delsa Grana, PA-C   4 months ago COVID-19   [place], FNP   4 months ago Essential hypertension   [facility] Bo Merino, FNP   1 year ago Essential hypertension   [facility] Delsa Grana, PA-C   1 year ago Essential hypertension   [facility] Delsa Grana, PA-C      Future Appointments            In 1 month Reece Packer, Myna Hidalgo, [facility], [facility]

## 2022-03-10 NOTE — Telephone Encounter (Signed)
Pt is requesting a 90 day supply, pt was advised by the pharmacy to contact the Drs office to get 90 day supplies, pt stated he takes 3 pills a day of Gabapentin.

## 2022-03-11 NOTE — Telephone Encounter (Signed)
Pts wife called back to state that the pharmacy wants Jesus Dillon to call them at [phone] so they can straighten the R supply out for pts Gabapentin / pt needs 270 capsules to have 9 days supply since he takes this 3xs a day / please advise when new RX is sent

## 2022-03-11 NOTE — Telephone Encounter (Signed)
Medication Refill - Medication:gabapentin (NEURONTIN) 300 MG capsule Patients wife called in states was told by pharmacy that refill request they received 06/01 shows 30 day supply, does the patient need to be prescribed more than 90 pills since he is taking it 3 times a day? Has the patient contacted their pharmacy? yes (Agent: If no, request that the patient contact the pharmacy for the refill. If patient does not wish to contact the pharmacy document the reason why and proceed with request.) (Agent: If yes, when and what did the pharmacy advise?)contact pcp  Preferred Pharmacy (with phone number or street name):  [facility] [address]  [place] [address]  Phone: [phone] Fax: [phone]   Has the patient been seen for an appointment in the last year OR does the patient have an upcoming appointment? yes  Agent: Please be advised that RX refills may take up to 3 business days. We ask that you follow-up with your pharmacy.

## 2022-03-13 ENCOUNTER — Other Ambulatory Visit: Payer: Self-pay | Admitting: Nurse Practitioner

## 2022-03-13 DIAGNOSIS — G8929 Other chronic pain: Secondary | ICD-10-CM

## 2022-03-13 MED ORDER — GABAPENTIN 300 MG PO CAPS: ORAL_CAPSULE | ORAL | 1 refills | Status: DC

## 2022-04-09 ENCOUNTER — Ambulatory Visit: Payer: Medicare Other | Admitting: Nurse Practitioner

## 2022-04-09 ENCOUNTER — Encounter: Payer: Self-pay | Admitting: Nurse Practitioner

## 2022-04-09 VITALS — BP 118/78 | HR 69 | Temp 98.9°F | Resp 16 | Ht 71.5 in | Wt 230.5 lb

## 2022-04-09 DIAGNOSIS — D696 Thrombocytopenia, unspecified: Secondary | ICD-10-CM | POA: Diagnosis not present

## 2022-04-09 DIAGNOSIS — R7303 Prediabetes: Secondary | ICD-10-CM | POA: Insufficient documentation

## 2022-04-09 DIAGNOSIS — M5441 Lumbago with sciatica, right side: Secondary | ICD-10-CM

## 2022-04-09 DIAGNOSIS — E782 Mixed hyperlipidemia: Secondary | ICD-10-CM

## 2022-04-09 DIAGNOSIS — G8929 Other chronic pain: Secondary | ICD-10-CM

## 2022-04-09 DIAGNOSIS — I340 Nonrheumatic mitral (valve) insufficiency: Secondary | ICD-10-CM

## 2022-04-09 DIAGNOSIS — I10 Essential (primary) hypertension: Secondary | ICD-10-CM | POA: Diagnosis not present

## 2022-04-09 DIAGNOSIS — R251 Tremor, unspecified: Secondary | ICD-10-CM | POA: Diagnosis not present

## 2022-04-09 DIAGNOSIS — R011 Cardiac murmur, unspecified: Secondary | ICD-10-CM

## 2022-04-09 NOTE — Assessment & Plan Note (Signed)
His last platelet count was 135 on 10/22/2020.  We will get lab work today.

## 2022-04-09 NOTE — Assessment & Plan Note (Signed)
Patient's last LDL was 89 on October 22, 2020.  Patient is due for lab work we will get labs today.  Continue taking atorvastatin 40 mg daily.

## 2022-04-09 NOTE — Assessment & Plan Note (Signed)
Patient sees Dr. Rockey Situ.  He last saw him on 09/30/2021.  Patient reports he has been doing well.

## 2022-04-09 NOTE — Assessment & Plan Note (Signed)
Patient reports his tremors have been fine as long as he takes his propranolol 10 mg 3 times a day.

## 2022-04-09 NOTE — Assessment & Plan Note (Signed)
Blood pressure at goal today 118/78.  Continue taking telmisartan 80 mg daily and amlodipine 10 mg daily.

## 2022-04-09 NOTE — Progress Notes (Signed)
BP 118/78   Pulse 69   Temp 98.9 F (37.2 C)   Resp 16   Ht 5' 11.5" (1.816 m)   Wt 230 lb 8 oz (104.6 kg)   SpO2 99%   BMI 31.70 kg/m    Subjective:    [name], male    DOB: 01/17/1941, 81 y.o.   MRN: [id number]  HPI: Jesus Dillon is a 81 y.o. male  Chief Complaint  Patient presents with   Follow-up   Hyperlipidemia   Hypertension   HTN: Blood pressure today is 118/78.  He says his blood pressure normally runs high around 140/80.  He denies any chest pain, shortness of breath, headaches or blurred vision.  He is currently taking telmisartan 80 mg daily and amlodipine 10 mg daily.    Hyperlipidemia: His last LDL was 89 on 10/22/2020.  He is due for lab work.  He says he takes his atorvastatin 40 mg daily.  He denies any myalgia.   Thrombocytopenia: His last platelet count was 135 on 10/22/2020.  He is due for lab work.  Denies any abnormal bruising, hematuria or blood in his stools.  Tremors: Patient reports that he has had tremors for a long time.  He takes propranolol 10 mg 3 times a day which seems to help.     Chronic right sided back pain: Patient currently takes gabapentin for his chronic right-sided back pain.  He takes gabapentin 300 mg 3 times a day.  This seems to help.   Heart murmur/ mitral valve regurgitation : Patient saw Dr. Rockey Situ on 09/30/2021.  Patient reports he has been doing well.  He had a stress test on 01/12/2018 which showed normal ejection fraction.  echocardiogram done 06/10/2018 also normal ejection fraction, mild to moderate mitral valve regurgitation.  EKG performed at cardiologist office which showed nonspecific T wave, no change compared to prior EKGs  Migraine: She reports that for a while there he was having really bad migraines he followed up with neurology.  But instead of following their instructions he went ahead and just stop taking the Celebrex.  Patient reports that his migraines have improved since stopping Celebrex.  Patient  states he feels like he was having a reaction to the Celebrex.  Discussed with patient that we would add that to his allergy list.  Relevant past medical, surgical, family and social history reviewed and updated as indicated. Interim medical history since our last visit reviewed. Allergies and medications reviewed and updated.  Review of Systems  Constitutional: Negative for fever or weight change.  Respiratory: Negative for cough and shortness of breath.   Cardiovascular: Negative for chest pain or palpitations.  Gastrointestinal: Negative for abdominal pain, no bowel changes.  Musculoskeletal: Negative for gait problem or joint swelling.  Skin: Negative for rash.  Neurological: Negative for dizziness or headache.  No other specific complaints in a complete review of systems (except as listed in HPI above).      Objective:    BP 118/78   Pulse 69   Temp 98.9 F (37.2 C)   Resp 16   Ht 5' 11.5" (1.816 m)   Wt 230 lb 8 oz (104.6 kg)   SpO2 99%   BMI 31.70 kg/m   Wt Readings from Last 3 Encounters:  04/09/22 230 lb 8 oz (104.6 kg)  01/01/22 234 lb 6.4 oz (106.3 kg)  10/21/21 232 lb (105.2 kg)    Physical Exam  Constitutional: Patient appears well-developed and  well-nourished. Obese  No distress.  HEENT: head atraumatic, normocephalic, pupils equal and reactive to light,  neck supple Cardiovascular: Normal rate, regular rhythm and normal heart sounds.   murmur heard. No BLE edema. Pulmonary/Chest: Effort normal and breath sounds normal. No respiratory distress. Abdominal: Soft.  There is no tenderness. Psychiatric: Patient has a normal mood and affect. behavior is normal. Judgment and thought content normal.  Results for orders placed or performed in visit on 10/21/21  POCT Influenza A/B  Result Value Ref Range   Influenza A, POC Negative Negative   Influenza B, POC Negative Negative      Assessment & Plan:   Problem List Items Addressed This Visit        Cardiovascular and Mediastinum   Essential hypertension - Primary    Blood pressure at goal today 118/78.  Continue taking telmisartan 80 mg daily and amlodipine 10 mg daily.      Relevant Orders   CBC with Differential/Platelet   COMPLETE METABOLIC PANEL WITH GFR   Mitral regurgitation    Patient sees Dr. Rockey Situ.  He last saw him on 09/30/2021.  Patient reports he has been doing well.        Nervous and Auditory   Chronic right-sided low back pain with right-sided sciatica    Patient reports he has been doing pretty well.  He continues to take gabapentin 300 mg 3 times a day.        Hematopoietic and Hemostatic   Thrombocytopenia ([place])    His last platelet count was 135 on 10/22/2020.  We will get lab work today.      Relevant Orders   CBC with Differential/Platelet     Other   Hyperlipidemia (Chronic)    Patient's last LDL was 89 on October 22, 2020.  Patient is due for lab work we will get labs today.  Continue taking atorvastatin 40 mg daily.      Relevant Orders   COMPLETE METABOLIC PANEL WITH GFR   Lipid panel   Cardiac murmur    Patient sees Dr. Rockey Situ.  He last saw him on 09/30/2021.  Patient reports he has been doing well.      Tremors of nervous system    Patient reports his tremors have been fine as long as he takes his propranolol 10 mg 3 times a day.      Prediabetes   Relevant Orders   Hemoglobin A1c     Follow up plan: Return in about 6 months (around 10/10/2022) for follow up.

## 2022-04-09 NOTE — Assessment & Plan Note (Signed)
Patient reports he has been doing pretty well.  He continues to take gabapentin 300 mg 3 times a day.

## 2022-04-10 LAB — CBC WITH DIFFERENTIAL/PLATELET
Absolute Monocytes: 435 cells/uL (ref 200–950)
Basophils Absolute: 29 cells/uL (ref 0–200)
Basophils Relative: 0.9 %
Eosinophils Absolute: 61 cells/uL (ref 15–500)
Eosinophils Relative: 1.9 %
HCT: 41.4 % (ref 38.5–50.0)
Hemoglobin: 13.6 g/dL (ref 13.2–17.1)
Lymphs Abs: 1453 cells/uL (ref 850–3900)
MCH: 28.6 pg (ref 27.0–33.0)
MCHC: 32.9 g/dL (ref 32.0–36.0)
MCV: 87 fL (ref 80.0–100.0)
MPV: 12.2 fL (ref 7.5–12.5)
Monocytes Relative: 13.6 %
Neutro Abs: 1222 cells/uL — ABNORMAL LOW (ref 1500–7800)
Neutrophils Relative %: 38.2 %
Platelets: 130 10*3/uL — ABNORMAL LOW (ref 140–400)
RBC: 4.76 10*6/uL (ref 4.20–5.80)
RDW: 13.7 % (ref 11.0–15.0)
Total Lymphocyte: 45.4 %
WBC: 3.2 10*3/uL — ABNORMAL LOW (ref 3.8–10.8)

## 2022-04-10 LAB — COMPLETE METABOLIC PANEL WITH GFR
AG Ratio: 1.3 (calc) (ref 1.0–2.5)
ALT: 28 U/L (ref 9–46)
AST: 31 U/L (ref 10–35)
Albumin: 4.5 g/dL (ref 3.6–5.1)
Alkaline phosphatase (APISO): 66 U/L (ref 35–144)
BUN: 13 mg/dL (ref 7–25)
CO2: 24 mmol/L (ref 20–32)
Calcium: 9.7 mg/dL (ref 8.6–10.3)
Chloride: 105 mmol/L (ref 98–110)
Creat: 1.06 mg/dL (ref 0.70–1.28)
Globulin: 3.6 g/dL (calc) (ref 1.9–3.7)
Glucose, Bld: 82 mg/dL (ref 65–99)
Potassium: 4.4 mmol/L (ref 3.5–5.3)
Sodium: 139 mmol/L (ref 135–146)
Total Bilirubin: 1 mg/dL (ref 0.2–1.2)
Total Protein: 8.1 g/dL (ref 6.1–8.1)
eGFR: 71 mL/min/{1.73_m2} (ref >=60)

## 2022-04-10 LAB — HEMOGLOBIN A1C
Hgb A1c MFr Bld: 5.9 % of total Hgb — ABNORMAL HIGH (ref <5.7)
Mean Plasma Glucose: 123 mg/dL
eAG (mmol/L): 6.8 mmol/L

## 2022-04-10 LAB — LIPID PANEL
Cholesterol: 130 mg/dL (ref <200)
HDL: 53 mg/dL (ref >=40)
LDL Cholesterol (Calc): 65 mg/dL (calc)
Non-HDL Cholesterol (Calc): 77 mg/dL (calc) (ref <130)
Total CHOL/HDL Ratio: 2.5 (calc) (ref <5.0)
Triglycerides: 50 mg/dL (ref <150)

## 2022-07-20 ENCOUNTER — Encounter (INDEPENDENT_AMBULATORY_CARE_PROVIDER_SITE_OTHER): Payer: Self-pay

## 2022-08-12 ENCOUNTER — Encounter: Payer: Self-pay | Admitting: Family Medicine

## 2022-08-12 ENCOUNTER — Ambulatory Visit: Payer: Medicare Other | Admitting: Family Medicine

## 2022-08-12 VITALS — BP 128/72 | HR 67 | Temp 99.1°F | Resp 16 | Ht 71.5 in | Wt 218.8 lb

## 2022-08-12 DIAGNOSIS — R0689 Other abnormalities of breathing: Secondary | ICD-10-CM | POA: Diagnosis not present

## 2022-08-12 DIAGNOSIS — J069 Acute upper respiratory infection, unspecified: Secondary | ICD-10-CM

## 2022-08-12 DIAGNOSIS — R0989 Other specified symptoms and signs involving the circulatory and respiratory systems: Secondary | ICD-10-CM

## 2022-08-12 MED ORDER — BENZONATATE 100 MG PO CAPS: 100.0000 mg | ORAL_CAPSULE | Freq: Three times a day (TID) | ORAL | 0 refills | Status: DC | PRN

## 2022-08-12 NOTE — Progress Notes (Signed)
[name], male    DOB: [birth year], 81 y.o.   MRN: [id number]  PCP: Bo Merino, FNP  Chief Complaint  Patient presents with   COVID sx    Subjective:   Jesus Dillon is a 81 y.o. male, presents to clinic with CC of the following:  Patient presented with his wife -she had an appointment for coughing for 4 days, patient multiple times explained his own symptoms and timeline and at the completion of his wife's visit they asked for me to examine him so he was put on the schedule He reports having onset of symptoms about a week and a half ago with a deeper more harsh cough that has recently began to improved he tried DayQuil and NyQuil but it made him ill he has not been taking anything else over-the-counter he did have a scratchy throat and some nasal discharge and congestion but that has improved and overall his coughing is less he currently denies any fever sweats chills shortness of breath, pain with breathing, chest pain  URI  This is a new problem. Episode onset: 7-10 d. The problem has been gradually improving. There has been no fever. Associated symptoms include congestion, coughing, rhinorrhea, sinus pain and a sore throat. Pertinent negatives include no abdominal pain, diarrhea, dysuria, ear pain, headaches, joint pain, joint swelling, nausea, neck pain, plugged ear sensation, rash, sneezing, swollen glands, vomiting or wheezing. Treatments tried: nyquil. The treatment provided no relief.      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   Pulmonary regurgitation 06/17/2018   Lumbar canal stenosis  12/20/2017   Abnormal EKG 12/20/2017   Chronic gout 05/19/2016   Tremors of nervous system 04/03/2016   Hyperlipidemia 02/27/2016   Hyperglycemia 02/27/2016   Cardiac murmur 02/27/2016   Essential hypertension 12/25/2015   Chronic right-sided low back pain with right-sided sciatica 12/25/2015      Current Outpatient Medications:    amLODipine (NORVASC) 10 MG tablet, Take 1 tablet (10 mg total) by mouth daily., Disp: 90 tablet, Rfl: 3   Ascorbic Acid (VITAMIN C) 1000 MG tablet, Take 1,000 mg by mouth daily., Disp: , Rfl:    aspirin EC 81 MG tablet, Take 1 tablet (81 mg total) by mouth daily. No NSAIDs for at least one hour, Disp: , Rfl:    atorvastatin (LIPITOR) 40 MG tablet, Take 1 tablet (40 mg total) by mouth at bedtime., Disp: 90 tablet, Rfl: 3   benzonatate (TESSALON) 100 MG capsule, Take 1 capsule (100 mg total) by mouth 3 (three) times daily as needed for cough., Disp: 30 capsule, Rfl: 0   cholecalciferol (VITAMIN D) 1000 units tablet, Take 1,000 Units by mouth daily., Disp: , Rfl:    gabapentin (NEURONTIN) 300 MG capsule, TAKE 1 CAPSULE BY MOUTH THREE TIMES A DAY, Disp: 270 capsule, Rfl: 1   mometasone (NASONEX) 50 MCG/ACT nasal spray, Place 2 sprays into the nose daily., Disp: 1 each, Rfl: 12   propranolol (INDERAL) 10 MG tablet, Take 1 tablet (10 mg total) by mouth 3 (three) times daily., Disp: 270 tablet, Rfl: 3   telmisartan (MICARDIS) 80 MG tablet, Take 1 tablet (  80 mg total) by mouth daily., Disp: 90 tablet, Rfl: 3   triamcinolone cream (KENALOG) 0.1 %, Apply 1 application. topically 2 (two) times daily., Disp: , Rfl:    Allergies  Allergen Reactions   Celebrex [Celecoxib]    Penicillins Rash    Has patient had a PCN reaction causing immediate rash, facial/tongue/throat swelling, SOB or lightheadedness with hypotension: no Has patient had a PCN reaction causing severe rash involving mucus membranes or skin necrosis: no Has patient had a PCN reaction that required  hospitalization: no Has patient had a PCN reaction occurring within the last 10 years: no If all of the above answers are "NO", then may proceed with Cephalosporin use.      Social History   Tobacco Use   Smoking status: Never   Smokeless tobacco: Never   Tobacco comments:    smoking cessation materials not required  Vaping Use   Vaping Use: Never used  Substance Use Topics   Alcohol use: No    Alcohol/week: 0.0 standard drinks of alcohol   Drug use: No      Chart Review Today: I personally reviewed active problem list, medication list, allergies, family history, social history, health maintenance, notes from last encounter, lab results, imaging with the patient/caregiver today.   Review of Systems  Constitutional: Negative.   HENT:  Positive for congestion, rhinorrhea, sinus pain and sore throat. Negative for ear pain and sneezing.   Eyes: Negative.   Respiratory:  Positive for cough. Negative for wheezing.   Cardiovascular: Negative.   Gastrointestinal: Negative.  Negative for abdominal pain, diarrhea, nausea and vomiting.  Endocrine: Negative.   Genitourinary: Negative.  Negative for dysuria.  Musculoskeletal: Negative.  Negative for joint pain and neck pain.  Skin: Negative.  Negative for rash.  Allergic/Immunologic: Negative.   Neurological: Negative.  Negative for headaches.  Hematological: Negative.   Psychiatric/Behavioral: Negative.    All other systems reviewed and are negative.      Objective:   Vitals:   08/12/22 1510  BP: 128/72  Pulse: 67  Resp: 16  Temp: 99.1 F (37.3 C)  TempSrc: Oral  SpO2: 98%  Weight: 218 lb 12.8 oz (99.2 kg)  Height: 5' 11.5" (1.816 m)    Body mass index is 30.09 kg/m.  Physical Exam Vitals and nursing note reviewed.  Constitutional:      General: He is not in acute distress.    Appearance: Normal appearance. He is well-developed. He is not toxic-appearing or diaphoretic.  HENT:     Head: Normocephalic and  atraumatic.     Jaw: No trismus.     Right Ear: Tympanic membrane, ear canal and external ear normal.     Left Ear: Tympanic membrane, ear canal and external ear normal.     Nose: Mucosal edema, congestion and rhinorrhea present. Rhinorrhea is clear.     Right Turbinates: Enlarged and swollen.     Left Turbinates: Enlarged and swollen.     Right Sinus: No maxillary sinus tenderness or frontal sinus tenderness.     Left Sinus: No maxillary sinus tenderness or frontal sinus tenderness.     Mouth/Throat:     Mouth: Mucous membranes are not pale, not dry and not cyanotic.     Pharynx: Uvula midline. Posterior oropharyngeal erythema present. No oropharyngeal exudate or uvula swelling.     Tonsils: No tonsillar exudate or tonsillar abscesses.  Eyes:     General: Lids are normal.        Right eye:  No discharge.        Left eye: No discharge.     Conjunctiva/sclera: Conjunctivae normal.     Pupils: Pupils are equal, round, and reactive to light.  Neck:     Trachea: Trachea and phonation normal. No tracheal deviation.  Cardiovascular:     Rate and Rhythm: Normal rate and regular rhythm.     Pulses:          Radial pulses are 2+ on the right side and 2+ on the left side.     Heart sounds: Normal heart sounds. No murmur heard.    No friction rub. No gallop.  Pulmonary:     Effort: Pulmonary effort is normal. No tachypnea, accessory muscle usage, respiratory distress or retractions.     Breath sounds: No stridor, decreased air movement or transmitted upper airway sounds. Examination of the right-lower field reveals rhonchi. Examination of the left-lower field reveals rhonchi. Rhonchi present. No decreased breath sounds, wheezing or rales.  Abdominal:     General: Bowel sounds are normal. There is no distension.     Palpations: Abdomen is soft.     Tenderness: There is no abdominal tenderness.  Musculoskeletal:        General: Normal range of motion.     Cervical back: Normal range of motion  and neck supple.  Skin:    General: Skin is warm and dry.     Capillary Refill: Capillary refill takes less than 2 seconds.     Coloration: Skin is not pale.     Findings: No rash.     Nails: There is no clubbing.  Neurological:     Mental Status: He is alert and oriented to person, place, and time.     Motor: No abnormal muscle tone.     Coordination: Coordination normal.     Gait: Gait normal.  Psychiatric:        Speech: Speech normal.        Behavior: Behavior normal. Behavior is cooperative.      Results for orders placed or performed in visit on 04/09/22  CBC with Differential/Platelet  Result Value Ref Range   WBC 3.2 (L) 3.8 - 10.8 Thousand/uL   RBC 4.76 4.20 - 5.80 Million/uL   Hemoglobin 13.6 13.2 - 17.1 g/dL   HCT 41.4 38.5 - 50.0 %   MCV 87.0 80.0 - 100.0 fL   MCH 28.6 27.0 - 33.0 pg   MCHC 32.9 32.0 - 36.0 g/dL   RDW 13.7 11.0 - 15.0 %   Platelets 130 (L) 140 - 400 Thousand/uL   MPV 12.2 7.5 - 12.5 fL   Neutro Abs 1,222 (L) 1,500 - 7,800 cells/uL   Lymphs Abs 1,453 850 - 3,900 cells/uL   Absolute Monocytes 435 200 - 950 cells/uL   Eosinophils Absolute 61 15 - 500 cells/uL   Basophils Absolute 29 0 - 200 cells/uL   Neutrophils Relative % 38.2 %   Total Lymphocyte 45.4 %   Monocytes Relative 13.6 %   Eosinophils Relative 1.9 %   Basophils Relative 0.9 %  COMPLETE METABOLIC PANEL WITH GFR  Result Value Ref Range   Glucose, Bld 82 65 - 99 mg/dL   BUN 13 7 - 25 mg/dL   Creat 1.06 0.70 - 1.28 mg/dL   eGFR 71 > OR = 60 mL/min/1.15m   BUN/Creatinine Ratio NOT APPLICABLE 6 - 22 (calc)   Sodium 139 135 - 146 mmol/L   Potassium 4.4 3.5 - 5.3 mmol/L  Chloride 105 98 - 110 mmol/L   CO2 24 20 - 32 mmol/L   Calcium 9.7 8.6 - 10.3 mg/dL   Total Protein 8.1 6.1 - 8.1 g/dL   Albumin 4.5 3.6 - 5.1 g/dL   Globulin 3.6 1.9 - 3.7 g/dL (calc)   AG Ratio 1.3 1.0 - 2.5 (calc)   Total Bilirubin 1.0 0.2 - 1.2 mg/dL   Alkaline phosphatase (APISO) 66 35 - 144 U/L   AST 31  10 - 35 U/L   ALT 28 9 - 46 U/L  Lipid panel  Result Value Ref Range   Cholesterol 130 <200 mg/dL   HDL 53 > OR = 40 mg/dL   Triglycerides 50 <150 mg/dL   LDL Cholesterol (Calc) 65 mg/dL (calc)   Total CHOL/HDL Ratio 2.5 <5.0 (calc)   Non-HDL Cholesterol (Calc) 77 <130 mg/dL (calc)  Hemoglobin A1c  Result Value Ref Range   Hgb A1c MFr Bld 5.9 (H) <5.7 % of total Hgb   Mean Plasma Glucose 123 mg/dL   eAG (mmol/L) 6.8 mmol/L       Assessment & Plan:   1. Upper respiratory tract infection, unspecified type Patient had onset of symptoms about a week and a half ago largely improving He had slightly edematous nasal mucosa with moderate amount of clear nasal discharge and mucosal and posterior oropharyngeal erythema.  He had no sinus tenderness to palpation, overall reports improving symptoms, is afebrile and well-appearing Likely a viral upper respiratory infection -that we will continue to improve and eventually resolve He can continue supportive treatment I did encourage him to try Tessalon Perles, antihistamine like Claritin or Zyrtec, and try Mucinex and push a lot of fluids and continue to rest at home - benzonatate (TESSALON) 100 MG capsule; Take 1 capsule (100 mg total) by mouth 3 (three) times daily as needed for cough.  Dispense: 30 capsule; Refill: 0  2. Abnormal breath sounds He had some slight congestion crackles or rhonchi bilaterally at the bases when he took very deep breaths It may have been some atelectasis with his very good inspiratory effort I doubt pneumonia with improving cough, no fever, no shortness of breath and no pain with breathing I consider doing a chest x-ray but he is clinically doing well and getting better Continue treatment as noted above and could get a chest x-ray and there is any worsening of his symptoms    Patient wanted COVID testing but he is far out of the window for any treatment encouraged him to test at home with his wife -for him it would  really only affect his isolation timing if he happened to be COVID-positive   Follow-up as needed     Delsa Grana, PA-C 08/12/22 7:38 PM

## 2022-09-25 ENCOUNTER — Other Ambulatory Visit: Payer: Self-pay | Admitting: Cardiovascular Disease

## 2022-09-25 DIAGNOSIS — I10 Essential (primary) hypertension: Secondary | ICD-10-CM

## 2022-09-25 NOTE — Telephone Encounter (Signed)
LVM to schedule appt

## 2022-09-25 NOTE — Telephone Encounter (Signed)
Please schedule 12 month F/U appt for 90 refills. Thank you!

## 2022-10-05 ENCOUNTER — Other Ambulatory Visit: Payer: Self-pay | Admitting: Nurse Practitioner

## 2022-10-05 DIAGNOSIS — R251 Tremor, unspecified: Secondary | ICD-10-CM

## 2022-10-05 DIAGNOSIS — G8929 Other chronic pain: Secondary | ICD-10-CM

## 2022-10-05 DIAGNOSIS — I10 Essential (primary) hypertension: Secondary | ICD-10-CM

## 2022-10-05 MED ORDER — GABAPENTIN 300 MG PO CAPS: ORAL_CAPSULE | ORAL | 1 refills | Status: DC

## 2022-10-14 ENCOUNTER — Ambulatory Visit: Payer: Medicare Other | Admitting: Nurse Practitioner

## 2022-10-22 DIAGNOSIS — R519 Headache, unspecified: Secondary | ICD-10-CM | POA: Insufficient documentation

## 2022-10-22 NOTE — Progress Notes (Deleted)
There were no vitals taken for this visit.   Subjective:    [name], male    DOB: 25-Jun-1941, 82 y.o.   MRN: [id number]  HPI: ESTABAN Dillon is a 82 y.o. male  No chief complaint on file.  HTN: His blood pressure today is ***.  He denies any blurred vision, headaches, shortness of breath or chest pain.  He currently takes telmisartan 80 mg daily and amlodipine 10 mg daily.   Hyperlipidemia: His last LDL was 5 on 04/09/2022.  Currently takes atorvastatin 40 mg daily.  He denies any myalgia. The 10-year ASCVD risk score (Arnett DK, et al., 2019) is: 27.6%   Values used to calculate the score:     Age: 83 years     Sex: Male     Is Non-Hispanic African American: Yes     Diabetic: No     Tobacco smoker: No     Systolic Blood Pressure: 123456 mmHg     Is BP treated: Yes     HDL Cholesterol: 53 mg/dL     Total Cholesterol: 130 mg/dL    Thrombocytopenia: Denies any abnormal bruising, blood in his stools or hematuria.  His last platelet count was 130 on 04/09/2022. This condition has been stable and chronic.   Tremors: She currently takes propranolol 10 mg 3 times a day.  Patient has been dealing with tremors for a long time.  Patient states history to be helpful.   Chronic right sided back pain: Patient takes gabapentin 300 mg 3 times a day.  Patient states he has had chronic right-sided back pain for a long time.  He says the gabapentin does help.   Heart murmur/ mitral valve regurgitation : Patient saw Dr. Rockey Situ on 09/30/2021.  Patient reports he has been doing well.  He had a stress test on 01/12/2018 which showed normal ejection fraction.  echocardiogram done 06/10/2018 also normal ejection fraction, mild to moderate mitral valve regurgitation.  EKG performed at cardiologist office which showed nonspecific T wave, no change compared to prior EKGs.  No Changes, condition is stable.   headache: Reports for a while he was having really bad migraines he followed up with  neurology.  Patient states that he stopped taking Celebrex and after she stopped taking it he was No longer having any headaches.   Relevant past medical, surgical, family and social history reviewed and updated as indicated. Interim medical history since our last visit reviewed. Allergies and medications reviewed and updated.  Review of Systems  Constitutional: Negative for fever or weight change.  Respiratory: Negative for cough and shortness of breath.   Cardiovascular: Negative for chest pain or palpitations.  Gastrointestinal: Negative for abdominal pain, no bowel changes.  Musculoskeletal: Negative for gait problem or joint swelling.  Skin: Negative for rash.  Neurological: Negative for dizziness or headache.  No other specific complaints in a complete review of systems (except as listed in HPI above).      Objective:    There were no vitals taken for this visit.  Wt Readings from Last 3 Encounters:  08/12/22 218 lb 12.8 oz (99.2 kg)  04/09/22 230 lb 8 oz (104.6 kg)  01/01/22 234 lb 6.4 oz (106.3 kg)    Physical Exam  Constitutional: Patient appears well-developed and well-nourished. Obese  No distress.  HEENT: head atraumatic, normocephalic, pupils equal and reactive to light,  neck supple Cardiovascular: Normal rate, regular rhythm and normal heart sounds.   murmur heard. No  BLE edema. Pulmonary/Chest: Effort normal and breath sounds normal. No respiratory distress. Abdominal: Soft.  There is no tenderness. Psychiatric: Patient has a normal mood and affect. behavior is normal. Judgment and thought content normal.  Results for orders placed or performed in visit on 04/09/22  CBC with Differential/Platelet  Result Value Ref Range   WBC 3.2 (L) 3.8 - 10.8 Thousand/uL   RBC 4.76 4.20 - 5.80 Million/uL   Hemoglobin 13.6 13.2 - 17.1 g/dL   HCT 41.4 38.5 - 50.0 %   MCV 87.0 80.0 - 100.0 fL   MCH 28.6 27.0 - 33.0 pg   MCHC 32.9 32.0 - 36.0 g/dL   RDW 13.7 11.0 - 15.0 %    Platelets 130 (L) 140 - 400 Thousand/uL   MPV 12.2 7.5 - 12.5 fL   Neutro Abs 1,222 (L) 1,500 - 7,800 cells/uL   Lymphs Abs 1,453 850 - 3,900 cells/uL   Absolute Monocytes 435 200 - 950 cells/uL   Eosinophils Absolute 61 15 - 500 cells/uL   Basophils Absolute 29 0 - 200 cells/uL   Neutrophils Relative % 38.2 %   Total Lymphocyte 45.4 %   Monocytes Relative 13.6 %   Eosinophils Relative 1.9 %   Basophils Relative 0.9 %  COMPLETE METABOLIC PANEL WITH GFR  Result Value Ref Range   Glucose, Bld 82 65 - 99 mg/dL   BUN 13 7 - 25 mg/dL   Creat 1.06 0.70 - 1.28 mg/dL   eGFR 71 > OR = 60 mL/min/1.16m   BUN/Creatinine Ratio NOT APPLICABLE 6 - 22 (calc)   Sodium 139 135 - 146 mmol/L   Potassium 4.4 3.5 - 5.3 mmol/L   Chloride 105 98 - 110 mmol/L   CO2 24 20 - 32 mmol/L   Calcium 9.7 8.6 - 10.3 mg/dL   Total Protein 8.1 6.1 - 8.1 g/dL   Albumin 4.5 3.6 - 5.1 g/dL   Globulin 3.6 1.9 - 3.7 g/dL (calc)   AG Ratio 1.3 1.0 - 2.5 (calc)   Total Bilirubin 1.0 0.2 - 1.2 mg/dL   Alkaline phosphatase (APISO) 66 35 - 144 U/L   AST 31 10 - 35 U/L   ALT 28 9 - 46 U/L  Lipid panel  Result Value Ref Range   Cholesterol 130 <200 mg/dL   HDL 53 > OR = 40 mg/dL   Triglycerides 50 <150 mg/dL   LDL Cholesterol (Calc) 65 mg/dL (calc)   Total CHOL/HDL Ratio 2.5 <5.0 (calc)   Non-HDL Cholesterol (Calc) 77 <130 mg/dL (calc)  Hemoglobin A1c  Result Value Ref Range   Hgb A1c MFr Bld 5.9 (H) <5.7 % of total Hgb   Mean Plasma Glucose 123 mg/dL   eAG (mmol/L) 6.8 mmol/L      Assessment & Plan:   Problem List Items Addressed This Visit   None    Follow up plan: No follow-ups on file.

## 2022-10-23 ENCOUNTER — Ambulatory Visit: Payer: Medicare Other | Admitting: Nurse Practitioner

## 2022-11-05 NOTE — Progress Notes (Signed)
BP 128/76   Pulse 81   Temp 97.9 F (36.6 C) (Oral)   Resp 18   Ht 5' 11.5" (1.816 m)   Wt 225 lb 12.8 oz (102.4 kg)   SpO2 98%   BMI 31.05 kg/m    Subjective:    Patient ID: [name], male    DOB: 1941-09-11, 81 y.o.   MRN: [id number]  HPI: JESSICA OSTRAND is a 82 y.o. male  Chief Complaint  Patient presents with   Hypertension   Hyperlipidemia    3 month follow up   HTN: His blood pressure today is 128/76.  He denies any blurred vision, headaches, shortness of breath or chest pain.  He currently takes telmisartan 80 mg daily and amlodipine 10 mg daily.   Hyperlipidemia: His last LDL was 65 on 04/09/2022.  Currently takes atorvastatin 40 mg daily.  He denies any myalgia. The 10-year ASCVD risk score (Arnett DK, et al., 2019) is: 21.7%   Values used to calculate the score:     Age: 89 years     Sex: Male     Is Non-Hispanic African American: Yes     Diabetic: No     Tobacco smoker: No     Systolic Blood Pressure: 0000000 mmHg     Is BP treated: Yes     HDL Cholesterol: 53 mg/dL     Total Cholesterol: 130 mg/dL    Thrombocytopenia: Denies any abnormal bruising, blood in his stools or hematuria.  His last platelet count was 130 on 04/09/2022. This condition has been stable and chronic.   Tremors: he currently takes propranolol 10 mg 3 times a day.  Patient has been dealing with tremors for a long time.  Patient states history to be helpful.   Chronic right sided back pain: Patient takes gabapentin 300 mg 3 times a day.  Patient states he has had chronic right-sided back pain for a long time.  He says the gabapentin does help.   Heart murmur/ mitral valve regurgitation : Patient saw Dr. Rockey Situ on 09/30/2021.  Patient reports he has been doing well.  He had a stress test on 01/12/2018 which showed normal ejection fraction.  echocardiogram done 06/10/2018 also normal ejection fraction, mild to moderate mitral valve regurgitation.  EKG performed at cardiologist office which showed  nonspecific T wave, no change compared to prior EKGs.  No Changes, condition is stable.   Tingling in hands: he says that especially at night he notices some tingling in his hands.  Discussed how he sleeps and he does bend his hands.  Recommend putting a wash cloth in his hands to not bend at the wrist.  Will check b12. Discussed if no improvement will refer to neuro for nerve testing.   Weight loss: his last visit he was 230 lbs on 04/09/2022. Today he is 225 lbs he says that he had lost weight and was 218 lbs.  He says that recently he has been eating more.  His wife was concerned about his weight loss. Will check thyroid.    Relevant past medical, surgical, family and social history reviewed and updated as indicated. Interim medical history since our last visit reviewed. Allergies and medications reviewed and updated.  Review of Systems  Constitutional: Negative for fever or weight change.  Respiratory: Negative for cough and shortness of breath.   Cardiovascular: Negative for chest pain or palpitations.  Gastrointestinal: Negative for abdominal pain, no bowel changes.  Musculoskeletal: Negative for gait problem or  joint swelling.  Skin: Negative for rash.  Neurological: Negative for dizziness or headache.  No other specific complaints in a complete review of systems (except as listed in HPI above).      Objective:    BP 128/76   Pulse 81   Temp 97.9 F (36.6 C) (Oral)   Resp 18   Ht 5' 11.5" (1.816 m)   Wt 225 lb 12.8 oz (102.4 kg)   SpO2 98%   BMI 31.05 kg/m   Wt Readings from Last 3 Encounters:  11/09/22 225 lb 12.8 oz (102.4 kg)  08/12/22 218 lb 12.8 oz (99.2 kg)  04/09/22 230 lb 8 oz (104.6 kg)    Physical Exam  Constitutional: Patient appears well-developed and well-nourished. Obese  No distress.  HEENT: head atraumatic, normocephalic, pupils equal and reactive to light,  neck supple Cardiovascular: Normal rate, regular rhythm and normal heart sounds.   murmur heard.  No BLE edema. Pulmonary/Chest: Effort normal and breath sounds normal. No respiratory distress. Abdominal: Soft.  There is no tenderness. Psychiatric: Patient has a normal mood and affect. behavior is normal. Judgment and thought content normal.  Results for orders placed or performed in visit on 04/09/22  CBC with Differential/Platelet  Result Value Ref Range   WBC 3.2 (L) 3.8 - 10.8 Thousand/uL   RBC 4.76 4.20 - 5.80 Million/uL   Hemoglobin 13.6 13.2 - 17.1 g/dL   HCT 41.4 38.5 - 50.0 %   MCV 87.0 80.0 - 100.0 fL   MCH 28.6 27.0 - 33.0 pg   MCHC 32.9 32.0 - 36.0 g/dL   RDW 13.7 11.0 - 15.0 %   Platelets 130 (L) 140 - 400 Thousand/uL   MPV 12.2 7.5 - 12.5 fL   Neutro Abs 1,222 (L) 1,500 - 7,800 cells/uL   Lymphs Abs 1,453 850 - 3,900 cells/uL   Absolute Monocytes 435 200 - 950 cells/uL   Eosinophils Absolute 61 15 - 500 cells/uL   Basophils Absolute 29 0 - 200 cells/uL   Neutrophils Relative % 38.2 %   Total Lymphocyte 45.4 %   Monocytes Relative 13.6 %   Eosinophils Relative 1.9 %   Basophils Relative 0.9 %  COMPLETE METABOLIC PANEL WITH GFR  Result Value Ref Range   Glucose, Bld 82 65 - 99 mg/dL   BUN 13 7 - 25 mg/dL   Creat 1.06 0.70 - 1.28 mg/dL   eGFR 71 > OR = 60 mL/min/1.84m   BUN/Creatinine Ratio NOT APPLICABLE 6 - 22 (calc)   Sodium 139 135 - 146 mmol/L   Potassium 4.4 3.5 - 5.3 mmol/L   Chloride 105 98 - 110 mmol/L   CO2 24 20 - 32 mmol/L   Calcium 9.7 8.6 - 10.3 mg/dL   Total Protein 8.1 6.1 - 8.1 g/dL   Albumin 4.5 3.6 - 5.1 g/dL   Globulin 3.6 1.9 - 3.7 g/dL (calc)   AG Ratio 1.3 1.0 - 2.5 (calc)   Total Bilirubin 1.0 0.2 - 1.2 mg/dL   Alkaline phosphatase (APISO) 66 35 - 144 U/L   AST 31 10 - 35 U/L   ALT 28 9 - 46 U/L  Lipid panel  Result Value Ref Range   Cholesterol 130 <200 mg/dL   HDL 53 > OR = 40 mg/dL   Triglycerides 50 <150 mg/dL   LDL Cholesterol (Calc) 65 mg/dL (calc)   Total CHOL/HDL Ratio 2.5 <5.0 (calc)   Non-HDL Cholesterol (Calc) 77  <130 mg/dL (calc)  Hemoglobin A1c  Result Value  Ref Range   Hgb A1c MFr Bld 5.9 (H) <5.7 % of total Hgb   Mean Plasma Glucose 123 mg/dL   eAG (mmol/L) 6.8 mmol/L      Assessment & Plan:   Problem List Items Addressed This Visit       Cardiovascular and Mediastinum   Essential hypertension - Primary    Continue taking telmisartan 80 mg daily and amlodipine 10 mg daily.      Relevant Orders   CBC with Differential/Platelet   COMPLETE METABOLIC PANEL WITH GFR   Mitral regurgitation    Patient established with cardiology.  Condition stable.        Nervous and Auditory   Chronic right-sided low back pain with right-sided sciatica    Currently taking gabapentin 300 mg 3 times a day.  condition stable.        Hematopoietic and Hemostatic   Thrombocytopenia ([place])    His last platelet count was 130 on 04/09/2022.  He denies any problems with it.  No abnormal bruising or bleeding.  Getting labs.      Relevant Orders   CBC with Differential/Platelet     Other   Hyperlipidemia (Chronic)    Continue taking atorvastatin 40 mg daily.      Relevant Orders   COMPLETE METABOLIC PANEL WITH GFR   Lipid panel   Cardiac murmur    Patient established with cardiology.  Condition stable.      Tremors of nervous system    Takes propranolol 10 mg 3 times a day.      Other Visit Diagnoses     Numbness and tingling in both hands       Will check b12. Discussed if no improvement will refer to neuro for nerve testing   Relevant Orders   Vitamin B12   Weight loss       only a few pounds, discussed not concerning, would like to have thyroid checked, labs ordered   Relevant Orders   TSH        Follow up plan: Return in about 6 months (around 05/10/2023) for follow up, needs to schedule awv.

## 2022-11-09 ENCOUNTER — Encounter: Payer: Self-pay | Admitting: Nurse Practitioner

## 2022-11-09 ENCOUNTER — Ambulatory Visit: Payer: Medicare Other | Admitting: Nurse Practitioner

## 2022-11-09 ENCOUNTER — Other Ambulatory Visit: Payer: Self-pay

## 2022-11-09 VITALS — BP 128/76 | HR 81 | Temp 97.9°F | Resp 18 | Ht 71.5 in | Wt 225.8 lb

## 2022-11-09 DIAGNOSIS — R634 Abnormal weight loss: Secondary | ICD-10-CM

## 2022-11-09 DIAGNOSIS — R251 Tremor, unspecified: Secondary | ICD-10-CM

## 2022-11-09 DIAGNOSIS — E782 Mixed hyperlipidemia: Secondary | ICD-10-CM

## 2022-11-09 DIAGNOSIS — R202 Paresthesia of skin: Secondary | ICD-10-CM

## 2022-11-09 DIAGNOSIS — D696 Thrombocytopenia, unspecified: Secondary | ICD-10-CM | POA: Diagnosis not present

## 2022-11-09 DIAGNOSIS — I10 Essential (primary) hypertension: Secondary | ICD-10-CM

## 2022-11-09 DIAGNOSIS — M5441 Lumbago with sciatica, right side: Secondary | ICD-10-CM

## 2022-11-09 DIAGNOSIS — R200 Anesthesia of skin: Secondary | ICD-10-CM

## 2022-11-09 DIAGNOSIS — G8929 Other chronic pain: Secondary | ICD-10-CM

## 2022-11-09 DIAGNOSIS — I340 Nonrheumatic mitral (valve) insufficiency: Secondary | ICD-10-CM

## 2022-11-09 DIAGNOSIS — R011 Cardiac murmur, unspecified: Secondary | ICD-10-CM

## 2022-11-09 NOTE — Assessment & Plan Note (Signed)
Patient established with cardiology.  Condition stable.

## 2022-11-09 NOTE — Assessment & Plan Note (Signed)
Takes propranolol 10 mg 3 times a day.

## 2022-11-09 NOTE — Assessment & Plan Note (Signed)
Currently taking gabapentin 300 mg 3 times a day.  condition stable.

## 2022-11-09 NOTE — Assessment & Plan Note (Signed)
Continue taking atorvastatin 40 mg daily.

## 2022-11-09 NOTE — Assessment & Plan Note (Signed)
His last platelet count was 130 on 04/09/2022.  He denies any problems with it.  No abnormal bruising or bleeding.  Getting labs.

## 2022-11-09 NOTE — Assessment & Plan Note (Signed)
Continue taking telmisartan 80 mg daily and amlodipine 10 mg daily.

## 2022-11-10 LAB — COMPLETE METABOLIC PANEL WITH GFR
AG Ratio: 1.1 (calc) (ref 1.0–2.5)
ALT: 27 U/L (ref 9–46)
AST: 28 U/L (ref 10–35)
Albumin: 4.4 g/dL (ref 3.6–5.1)
Alkaline phosphatase (APISO): 70 U/L (ref 35–144)
BUN: 11 mg/dL (ref 7–25)
CO2: 26 mmol/L (ref 20–32)
Calcium: 9.5 mg/dL (ref 8.6–10.3)
Chloride: 105 mmol/L (ref 98–110)
Creat: 0.95 mg/dL (ref 0.70–1.28)
Globulin: 4.1 g/dL (calc) — ABNORMAL HIGH (ref 1.9–3.7)
Glucose, Bld: 90 mg/dL (ref 65–99)
Potassium: 4.1 mmol/L (ref 3.5–5.3)
Sodium: 139 mmol/L (ref 135–146)
Total Bilirubin: 0.7 mg/dL (ref 0.2–1.2)
Total Protein: 8.5 g/dL — ABNORMAL HIGH (ref 6.1–8.1)
eGFR: 81 mL/min/{1.73_m2} (ref >=60)

## 2022-11-10 LAB — CBC WITH DIFFERENTIAL/PLATELET
Absolute Monocytes: 341 cells/uL (ref 200–950)
Basophils Absolute: 31 cells/uL (ref 0–200)
Basophils Relative: 1 %
Eosinophils Absolute: 90 cells/uL (ref 15–500)
Eosinophils Relative: 2.9 %
HCT: 42.3 % (ref 38.5–50.0)
Hemoglobin: 14.1 g/dL (ref 13.2–17.1)
Lymphs Abs: 1324 cells/uL (ref 850–3900)
MCH: 28.8 pg (ref 27.0–33.0)
MCHC: 33.3 g/dL (ref 32.0–36.0)
MCV: 86.3 fL (ref 80.0–100.0)
MPV: 12.3 fL (ref 7.5–12.5)
Monocytes Relative: 11 %
Neutro Abs: 1314 cells/uL — ABNORMAL LOW (ref 1500–7800)
Neutrophils Relative %: 42.4 %
Platelets: 128 10*3/uL — ABNORMAL LOW (ref 140–400)
RBC: 4.9 10*6/uL (ref 4.20–5.80)
RDW: 13.7 % (ref 11.0–15.0)
Total Lymphocyte: 42.7 %
WBC: 3.1 10*3/uL — ABNORMAL LOW (ref 3.8–10.8)

## 2022-11-10 LAB — LIPID PANEL
Cholesterol: 142 mg/dL (ref <200)
HDL: 62 mg/dL (ref >=40)
LDL Cholesterol (Calc): 64 mg/dL (calc)
Non-HDL Cholesterol (Calc): 80 mg/dL (calc) (ref <130)
Total CHOL/HDL Ratio: 2.3 (calc) (ref <5.0)
Triglycerides: 80 mg/dL (ref <150)

## 2022-11-10 LAB — VITAMIN B12: Vitamin B-12: 442 pg/mL (ref 200–1100)

## 2022-11-10 LAB — TSH: TSH: 2.44 mIU/L (ref 0.40–4.50)

## 2022-11-27 ENCOUNTER — Other Ambulatory Visit: Payer: Self-pay | Admitting: Nurse Practitioner

## 2022-11-27 DIAGNOSIS — E782 Mixed hyperlipidemia: Secondary | ICD-10-CM

## 2022-11-27 NOTE — Telephone Encounter (Signed)
Requested Prescriptions  Pending Prescriptions Disp Refills   atorvastatin (LIPITOR) 40 MG tablet [Pharmacy Med Name: ATORVASTATIN TABS '40MG'$ ] 90 tablet 3    Sig: TAKE 1 TABLET AT BEDTIME     Cardiovascular:  Antilipid - Statins Failed - 11/27/2022  1:24 AM      Failed - Lipid Panel in normal range within the last 12 months    Cholesterol, Total  Date Value Ref Range Status  10/21/2017 154 100 - 199 mg/dL Final   Cholesterol  Date Value Ref Range Status  11/09/2022 142 <200 mg/dL Final   LDL Cholesterol (Calc)  Date Value Ref Range Status  11/09/2022 64 mg/dL (calc) Final    Comment:    Reference range: <100 . Desirable range <100 mg/dL for primary prevention;   <70 mg/dL for patients with CHD or diabetic patients  with > or = 2 CHD risk factors. Marland Kitchen LDL-C is now calculated using the Martin-Hopkins  calculation, which is a validated novel method providing  better accuracy than the Friedewald equation in the  estimation of LDL-C.  Cresenciano Genre et al. Annamaria Helling. [id number]): 2061-2068  (http://education.[facility].com/faq/FAQ164)    HDL  Date Value Ref Range Status  11/09/2022 62 > OR = 40 mg/dL Final  10/21/2017 46 >39 mg/dL Final   Triglycerides  Date Value Ref Range Status  11/09/2022 80 <150 mg/dL Final         Passed - Patient is not pregnant      Passed - Valid encounter within last 12 months    Recent Outpatient Visits           2 weeks ago Essential hypertension   [facility] Bo Merino, FNP   3 months ago Upper respiratory tract infection, unspecified type   [facility] Delsa Grana, PA-C   7 months ago Essential hypertension   [facility] Serafina Royals F, FNP   11 months ago Acute nonintractable headache, unspecified headache type   [facility] Delsa Grana, PA-C   1 year ago [facility] Bo Merino,  FNP       Future Appointments             In 2 months Gollan, Kathlene November, MD [place] [facility] [place]   In 2 months  [facility], [place]   In 5 months Reece Packer, Myna Hidalgo, [facility], [facility]

## 2022-12-21 ENCOUNTER — Other Ambulatory Visit: Payer: Self-pay | Admitting: Cardiovascular Disease

## 2022-12-21 DIAGNOSIS — I10 Essential (primary) hypertension: Secondary | ICD-10-CM

## 2023-02-01 ENCOUNTER — Ambulatory Visit: Payer: Medicare Other | Admitting: Cardiovascular Disease

## 2023-02-11 ENCOUNTER — Ambulatory Visit (INDEPENDENT_AMBULATORY_CARE_PROVIDER_SITE_OTHER): Payer: Medicare Other

## 2023-02-11 VITALS — Ht 72.0 in | Wt 220.0 lb

## 2023-02-11 DIAGNOSIS — Z0000 Encounter for general adult medical examination without abnormal findings: Secondary | ICD-10-CM

## 2023-02-11 NOTE — Patient Instructions (Addendum)
Jesus Dillon , Thank you for taking time to come for your Medicare Wellness Visit. I appreciate your ongoing commitment to your health goals. Please review the following plan we discussed and let me know if I can assist you in the future.   These are the goals we discussed:  Goals      DIET - REDUCE SODIUM INTAKE     Recommend to begin DASH diet (Diet attached to AVS).     Increase physical activity     Recommend increasing physical activity to 150 minutes per week        This is a list of the screening recommended for you and due dates:  Health Maintenance  Topic Date Due   Zoster (Shingles) Vaccine (1 of 2) Never done   DTaP/Tdap/Td vaccine (2 - Td or Tdap) 04/11/2022   COVID-19 Vaccine (8 - 2023-24 season) 05/22/2022   Flu Shot  04/22/2023   Medicare Annual Wellness Visit  02/11/2024   Pneumonia Vaccine  Completed   HPV Vaccine  Aged Out   Colon Cancer Screening  Discontinued   Hepatitis C Screening  Discontinued    Advanced directives: no mailed to pt  Conditions/risks identified: none  Next appointment: Follow up in one year for your annual wellness visit. 02/17/2024 @ 3:30pm telephone  Preventive Care 65 Years and Older, Male  Preventive care refers to lifestyle choices and visits with your health care provider that can promote health and wellness. What does preventive care include? A yearly physical exam. This is also called an annual well check. Dental exams once or twice a year. Routine eye exams. Ask your health care provider how often you should have your eyes checked. Personal lifestyle choices, including: Daily care of your teeth and gums. Regular physical activity. Eating a healthy diet. Avoiding tobacco and drug use. Limiting alcohol use. Practicing safe sex. Taking low doses of aspirin every day. Taking vitamin and mineral supplements as recommended by your health care provider. What happens during an annual well check? The services and screenings done  by your health care provider during your annual well check will depend on your age, overall health, lifestyle risk factors, and family history of disease. Counseling  Your health care provider may ask you questions about your: Alcohol use. Tobacco use. Drug use. Emotional well-being. Home and relationship well-being. Sexual activity. Eating habits. History of falls. Memory and ability to understand (cognition). Work and work Astronomer. Screening  You may have the following tests or measurements: Height, weight, and BMI. Blood pressure. Lipid and cholesterol levels. These may be checked every 5 years, or more frequently if you are over 59 years old. Skin check. Lung cancer screening. You may have this screening every year starting at age 30 if you have a 30-pack-year history of smoking and currently smoke or have quit within the past 15 years. Fecal occult blood test (FOBT) of the stool. You may have this test every year starting at age 25. Flexible sigmoidoscopy or colonoscopy. You may have a sigmoidoscopy every 5 years or a colonoscopy every 10 years starting at age 55. Prostate cancer screening. Recommendations will vary depending on your family history and other risks. Hepatitis C blood test. Hepatitis B blood test. Sexually transmitted disease (STD) testing. Diabetes screening. This is done by checking your blood sugar (glucose) after you have not eaten for a while (fasting). You may have this done every 1-3 years. Abdominal aortic aneurysm (AAA) screening. You may need this if you are a current  or former smoker. Osteoporosis. You may be screened starting at age 95 if you are at high risk. Talk with your health care provider about your test results, treatment options, and if necessary, the need for more tests. Vaccines  Your health care provider may recommend certain vaccines, such as: Influenza vaccine. This is recommended every year. Tetanus, diphtheria, and acellular  pertussis (Tdap, Td) vaccine. You may need a Td booster every 10 years. Zoster vaccine. You may need this after age 73. Pneumococcal 13-valent conjugate (PCV13) vaccine. One dose is recommended after age 74. Pneumococcal polysaccharide (PPSV23) vaccine. One dose is recommended after age 75. Talk to your health care provider about which screenings and vaccines you need and how often you need them. This information is not intended to replace advice given to you by your health care provider. Make sure you discuss any questions you have with your health care provider. Document Released: 10/04/2015 Document Revised: 05/27/2016 Document Reviewed: 07/09/2015 Elsevier Interactive Patient Education  2017 [employer].  Fall Prevention in the Home Falls can cause injuries. They can happen to people of all ages. There are many things you can do to make your home safe and to help prevent falls. What can I do on the outside of my home? Regularly fix the edges of walkways and driveways and fix any cracks. Remove anything that might make you trip as you walk through a door, such as a raised step or threshold. Trim any bushes or trees on the path to your home. Use bright outdoor lighting. Clear any walking paths of anything that might make someone trip, such as rocks or tools. Regularly check to see if handrails are loose or broken. Make sure that both sides of any steps have handrails. Any raised decks and porches should have guardrails on the edges. Have any leaves, snow, or ice cleared regularly. Use sand or salt on walking paths during winter. Clean up any spills in your garage right away. This includes oil or grease spills. What can I do in the bathroom? Use night lights. Install grab bars by the toilet and in the tub and shower. Do not use towel bars as grab bars. Use non-skid mats or decals in the tub or shower. If you need to sit down in the shower, use a plastic, non-slip stool. Keep the floor  dry. Clean up any water that spills on the floor as soon as it happens. Remove soap buildup in the tub or shower regularly. Attach bath mats securely with double-sided non-slip rug tape. Do not have throw rugs and other things on the floor that can make you trip. What can I do in the bedroom? Use night lights. Make sure that you have a light by your bed that is easy to reach. Do not use any sheets or blankets that are too big for your bed. They should not hang down onto the floor. Have a firm chair that has side arms. You can use this for support while you get dressed. Do not have throw rugs and other things on the floor that can make you trip. What can I do in the kitchen? Clean up any spills right away. Avoid walking on wet floors. Keep items that you use a lot in easy-to-reach places. If you need to reach something above you, use a strong step stool that has a grab bar. Keep electrical cords out of the way. Do not use floor polish or wax that makes floors slippery. If you must use wax,  use non-skid floor wax. Do not have throw rugs and other things on the floor that can make you trip. What can I do with my stairs? Do not leave any items on the stairs. Make sure that there are handrails on both sides of the stairs and use them. Fix handrails that are broken or loose. Make sure that handrails are as long as the stairways. Check any carpeting to make sure that it is firmly attached to the stairs. Fix any carpet that is loose or worn. Avoid having throw rugs at the top or bottom of the stairs. If you do have throw rugs, attach them to the floor with carpet tape. Make sure that you have a light switch at the top of the stairs and the bottom of the stairs. If you do not have them, ask someone to add them for you. What else can I do to help prevent falls? Wear shoes that: Do not have high heels. Have rubber bottoms. Are comfortable and fit you well. Are closed at the toe. Do not wear  sandals. If you use a stepladder: Make sure that it is fully opened. Do not climb a closed stepladder. Make sure that both sides of the stepladder are locked into place. Ask someone to hold it for you, if possible. Clearly mark and make sure that you can see: Any grab bars or handrails. First and last steps. Where the edge of each step is. Use tools that help you move around (mobility aids) if they are needed. These include: Canes. Walkers. Scooters. Crutches. Turn on the lights when you go into a dark area. Replace any light bulbs as soon as they burn out. Set up your furniture so you have a clear path. Avoid moving your furniture around. If any of your floors are uneven, fix them. If there are any pets around you, be aware of where they are. Review your medicines with your doctor. Some medicines can make you feel dizzy. This can increase your chance of falling. Ask your doctor what other things that you can do to help prevent falls. This information is not intended to replace advice given to you by your health care provider. Make sure you discuss any questions you have with your health care provider. Document Released: 07/04/2009 Document Revised: 02/13/2016 Document Reviewed: 10/12/2014 Elsevier Interactive Patient Education  2017 [employer].

## 2023-02-11 NOTE — Progress Notes (Signed)
I connected with  [name] on 02/11/23 by a audio enabled telemedicine application and verified that I am speaking with the correct person using two identifiers.  Patient Location: Home  Provider Location: Home Office  I discussed the limitations of evaluation and management by telemedicine. The patient expressed understanding and agreed to proceed.  Subjective:   Jesus Dillon is a 82 y.o. male who presents for Medicare Annual/Subsequent preventive examination.  Review of Systems     Cardiac Risk Factors include: advanced age (>4men, >58 women);dyslipidemia;hypertension;male gender;sedentary lifestyle     Objective:    Today's Vitals   02/11/23 1542  Weight: 220 lb (99.8 kg)  Height: 6' (1.829 m)   Body mass index is 29.84 kg/m.     02/11/2023    3:53 PM 09/10/2020    3:28 PM 07/20/2019    3:56 PM 08/25/2018    6:58 AM 06/09/2018    3:30 PM 03/01/2018    6:11 AM 02/24/2018    9:04 AM  Advanced Directives  Does Patient Have a Medical Advance Directive? No Yes Yes Yes Yes Yes Yes  Type of Furniture conservator/restorer;Living will Healthcare Power of [place];Living will Healthcare Power of [place];Living will Healthcare Power of [place];Living will Healthcare Power of [place];Living will Healthcare Power of [place];Living will  Does patient want to make changes to medical advance directive?      No - Patient declined No - Patient declined  Copy of Healthcare Power of Attorney in Chart?  No - copy requested No - copy requested No - copy requested No - copy requested Yes No - copy requested    Current Medications (verified) Outpatient Encounter Medications as of 02/11/2023  Medication Sig   amLODipine (NORVASC) 10 MG tablet Take 1 tablet (10 mg total) by mouth daily.   Ascorbic Acid (VITAMIN C) 1000 MG tablet Take 1,000 mg by mouth daily.   aspirin EC 81 MG tablet Take 1 tablet (81 mg total) by mouth daily. No NSAIDs for at least one hour    cholecalciferol (VITAMIN D) 1000 units tablet Take 1,000 Units by mouth daily.   gabapentin (NEURONTIN) 300 MG capsule TAKE 1 CAPSULE BY MOUTH THREE TIMES A DAY   propranolol (INDERAL) 10 MG tablet TAKE 1 TABLET THREE TIMES A DAY   telmisartan (MICARDIS) 80 MG tablet TAKE 1 TABLET DAILY   atorvastatin (LIPITOR) 40 MG tablet TAKE 1 TABLET AT BEDTIME   No facility-administered encounter medications on file as of 02/11/2023.    Allergies (verified) Celebrex [celecoxib] and Penicillins   History: Past Medical History:  Diagnosis Date   Arthritis    Diastolic dysfunction 06/17/2018   Grade 1, echo Sept 2019   GERD (gastroesophageal reflux disease)    Gout    HBP (high blood pressure)    Heart murmur    Hyperlipidemia    Left atrial dilation 06/17/2018   Echo Sept 2019   Mitral regurgitation 06/17/2018   Echo Sept 2019   Pulmonary regurgitation 06/17/2018   Echo Sept 2019   Tremors of nervous system    Past Surgical History:  Procedure Laterality Date   ANAL FISSURECTOMY     COLONOSCOPY WITH PROPOFOL N/A 08/25/2018   Procedure: COLONOSCOPY WITH PROPOFOL;  Surgeon: Wyline Mood, MD;  Location: [facility] ENDOSCOPY;  Service: Gastroenterology;  Laterality: N/A;   HYDROCELE EXCISION Left 03/01/2018   Procedure: HYDROCELECTOMY ADULT;  Surgeon: Riki Altes, MD;  Location: [facility] ORS;  Service: Urology;  Laterality: Left;  Family History  Problem Relation Age of Onset   Hypertension Mother    Dementia Mother    Angina Mother    Diabetes Brother    Hypertension Brother    Blindness Maternal Grandmother    Social History   Socioeconomic History   Marital status: Married    Spouse name: Dianne   Number of children: 1   Years of education: Not on file   Highest education level: Master's degree (e.g., MA, MS, MEng, MEd, MSW, MBA)  Occupational History   Occupation: Retired  Tobacco Use   Smoking status: Never   Smokeless tobacco: Never   Tobacco comments:    smoking cessation  materials not required  Vaping Use   Vaping Use: Never used  Substance and Sexual Activity   Alcohol use: No    Alcohol/week: 0.0 standard drinks of alcohol   Drug use: No   Sexual activity: Yes    Partners: Female  Other Topics Concern   Not on file  Social History Narrative   Not on file   Social Determinants of Health   Financial Resource Strain: Low Risk  (02/11/2023)   Overall Financial Resource Strain (CARDIA)    Difficulty of Paying Living Expenses: Not hard at all  Food Insecurity: No Food Insecurity (02/11/2023)   Hunger Vital Sign    Worried About Running Out of Food in the Last Year: Never true    Ran Out of Food in the Last Year: Never true  Transportation Needs: No Transportation Needs (02/11/2023)   PRAPARE - Administrator, Civil Service (Medical): No    Lack of Transportation (Non-Medical): No  Physical Activity: Inactive (02/11/2023)   Exercise Vital Sign    Days of Exercise per Week: 0 days    Minutes of Exercise per Session: 0 min  Stress: No Stress Concern Present (02/11/2023)   [employer] of Occupational Health - Occupational Stress Questionnaire    Feeling of Stress : Not at all  Social Connections: Socially Integrated (02/11/2023)   Social Connection and Isolation Panel [NHANES]    Frequency of Communication with Friends and Family: More than three times a week    Frequency of Social Gatherings with Friends and Family: Not on file    Attends Religious Services: More than 4 times per year    Active Member of [employer] or Organizations: Yes    Attends Engineer, structural: More than 4 times per year    Marital Status: Married    Tobacco Counseling Counseling given: Not Answered Tobacco comments: smoking cessation materials not required   Clinical Intake:  Pre-visit preparation completed: Yes  Pain : No/denies pain   BMI - recorded: 29.84 Nutritional Status: BMI 25 -29 Overweight Nutritional Risks: None Diabetes: No  How  often do you need to have someone help you when you read instructions, pamphlets, or other written materials from your doctor or pharmacy?: 1 - Never  Diabetic?no  Interpreter Needed?: No  Comments: lives with wife Information entered by :: B.Aidyn Kellis,LPN   Activities of Daily Living    02/11/2023    3:53 PM 11/09/2022    2:30 PM  In your present state of health, do you have any difficulty performing the following activities:  Hearing? 0 0  Vision? 0 0  Difficulty concentrating or making decisions? 0 0  Walking or climbing stairs? 1 0  Dressing or bathing? 0 0  Doing errands, shopping? 0 0  Preparing Food and eating ? N   Using  the Toilet? N   In the past six months, have you accidently leaked urine? N   Do you have problems with loss of bowel control? N   Managing your Medications? N   Managing your Finances? N   Housekeeping or managing your Housekeeping? N     Patient Care Team: Berniece Salines, FNP as PCP - General (Nurse Practitioner) Antonieta Iba, MD as Consulting Physician (Cardiology) Riki Altes, MD as Consulting Physician (Urology) Juanell Fairly, MD as Consulting Physician (Orthopedic Surgery)  Indicate any recent Medical Services you may have received from other than [facility] providers in the past year (date may be approximate).     Assessment:   This is a routine wellness examination for Radlee.  Hearing/Vision screen Hearing Screening - Comments:: Adequate hearing Vision Screening - Comments:: Adequate vision  Dr Inez Pilgrim  Dietary issues and exercise activities discussed: Current Exercise Habits: The patient does not participate in regular exercise at present, Exercise limited by: orthopedic condition(s);neurologic condition(s)   Goals Addressed             This Visit's Progress    DIET - REDUCE SODIUM INTAKE   On track    Recommend to begin DASH diet (Diet attached to AVS).       Depression Screen    02/11/2023    3:48 PM  11/09/2022    2:31 PM 08/12/2022    3:09 PM 04/09/2022    3:00 PM 01/01/2022    1:22 PM 10/21/2021   12:54 PM 2021/10/29    1:25 PM  PHQ 2/9 Scores  PHQ - 2 Score 0 0 0 0 0 0 0  PHQ- 9 Score   0 0 0  0    Fall Risk    02/11/2023    3:46 PM 11/09/2022    2:30 PM 08/12/2022    3:09 PM 04/09/2022    3:00 PM 01/01/2022    1:22 PM  Fall Risk   Falls in the past year? 0 0 0 0 0  Number falls in past yr: 0 0 0 0 0  Injury with Fall? 0 0 0 0 0  Risk for fall due to : No Fall Risks  No Fall Risks  No Fall Risks  Follow up Education provided;Falls prevention discussed  Falls prevention discussed;Education provided;Falls evaluation completed  Falls prevention discussed    FALL RISK PREVENTION PERTAINING TO THE HOME:  Any stairs in or around the home? Yes  If so, are there any without handrails? Yes  Home free of loose throw rugs in walkways, pet beds, electrical cords, etc? Yes  Adequate lighting in your home to reduce risk of falls? Yes   ASSISTIVE DEVICES UTILIZED TO PREVENT FALLS:  Life alert? No  Use of a cane, walker or w/c? No  Grab bars in the bathroom? No  Shower chair or bench in shower? Yes  Elevated toilet seat or a handicapped toilet? Yes   Cognitive Function:        02/11/2023    3:57 PM 07/20/2019    3:58 PM 06/09/2018    3:41 PM  6CIT Screen  What Year? 0 points 0 points 0 points  What month? 0 points 0 points 0 points  What time? 0 points 0 points 0 points  Count back from 20 0 points 0 points 0 points  Months in reverse 0 points 0 points 4 points  Repeat phrase 0 points 0 points 0 points  Total Score 0 points 0 points 4  points    Immunizations Immunization History  Administered Date(s) Administered   Fluad Quad(high Dose 65+) 06/30/2019, 08/23/2020   Influenza, High Dose Seasonal PF 06/18/2017, 06/06/2018   Influenza-Unspecified 07/16/2021   PFIZER Comirnaty(Gray Top)Covid-19 Tri-Sucrose Vaccine 11/13/2019, 12/04/2019   PFIZER(Purple Top)SARS-COV-2  Vaccination 11/13/2019, 12/04/2019, 07/05/2020, 07/16/2021   Pfizer Covid-19 Vaccine Bivalent Booster 54yrs & up 07/11/2021   Pneumococcal Conjugate-13 12/05/2013   Pneumococcal Polysaccharide-23 04/11/2012   Tdap 04/11/2012    TDAP status: Up to date  Flu Vaccine status: Up to date  Pneumococcal vaccine status: Up to date  Covid-19 vaccine status: Completed vaccines  Qualifies for Shingles Vaccine? Yes   Zostavax completed No   Shingrix Completed?: No.    Education has been provided regarding the importance of this vaccine. Patient has been advised to call insurance company to determine out of pocket expense if they have not yet received this vaccine. Advised may also receive vaccine at local pharmacy or Health Dept. Verbalized acceptance and understanding.  Screening Tests Health Maintenance  Topic Date Due   Zoster Vaccines- Shingrix (1 of 2) Never done   DTaP/Tdap/Td (2 - Td or Tdap) 04/11/2022   COVID-19 Vaccine (8 - 2023-24 season) 05/22/2022   INFLUENZA VACCINE  04/22/2023   Medicare Annual Wellness (AWV)  02/11/2024   Pneumonia Vaccine 83+ Years old  Completed   HPV VACCINES  Aged Out   Colonoscopy  Discontinued   Hepatitis C Screening  Discontinued    Health Maintenance  Health Maintenance Due  Topic Date Due   Zoster Vaccines- Shingrix (1 of 2) Never done   DTaP/Tdap/Td (2 - Td or Tdap) 04/11/2022   COVID-19 Vaccine (8 - 2023-24 season) 05/22/2022    Colorectal cancer screening: No longer required.   Lung Cancer Screening: (Low Dose CT Chest recommended if Age 37-80 years, 30 pack-year currently smoking OR have quit w/in 15years.) does not qualify.   Lung Cancer Screening Referral: no  Additional Screening:  Hepatitis C Screening: does not qualify; Completed no  Vision Screening: Recommended annual ophthalmology exams for early detection of glaucoma and other disorders of the eye. Is the patient up to date with their annual eye exam?  Yes  Who is the  provider or what is the name of the office in which the patient attends annual eye exams? Dr Inez Pilgrim If pt is not established with a provider, would they like to be referred to a provider to establish care? No .   Dental Screening: Recommended annual dental exams for proper oral hygiene  Community Resource Referral / Chronic Care Management: CRR required this visit?  No   CCM required this visit?  No    Plan:     I have personally reviewed and noted the following in the patient's chart:   Medical and social history Use of alcohol, tobacco or illicit drugs  Current medications and supplements including opioid prescriptions. Patient is not currently taking opioid prescriptions. Functional ability and status Nutritional status Physical activity Advanced directives List of other physicians Hospitalizations, surgeries, and ER visits in previous 12 months Vitals Screenings to include cognitive, depression, and falls Referrals and appointments  In addition, I have reviewed and discussed with patient certain preventive protocols, quality metrics, and best practice recommendations. A written personalized care plan for preventive services as well as general preventive health recommendations were provided to patient.     Sue Lush, LPN   1/61/0960   Nurse Notes: pt is doing well and has no questions or concerns  at this time. He talks about having problems with his rt knee which puts limitations on his walking/exercise.  *Advance directive paperwork mailed for completion per request.

## 2023-03-19 ENCOUNTER — Other Ambulatory Visit: Payer: Self-pay | Admitting: Cardiovascular Disease

## 2023-03-19 DIAGNOSIS — I10 Essential (primary) hypertension: Secondary | ICD-10-CM

## 2023-03-19 NOTE — Telephone Encounter (Signed)
Appt 05/04/23

## 2023-04-16 ENCOUNTER — Ambulatory Visit: Payer: Medicare Other | Admitting: Nurse Practitioner

## 2023-04-30 ENCOUNTER — Telehealth: Payer: Self-pay | Admitting: Cardiovascular Disease

## 2023-04-30 DIAGNOSIS — I10 Essential (primary) hypertension: Secondary | ICD-10-CM

## 2023-04-30 MED ORDER — AMLODIPINE BESYLATE 10 MG PO TABS: 10.0000 mg | ORAL_TABLET | Freq: Every day | ORAL | 0 refills | Status: DC

## 2023-04-30 NOTE — Telephone Encounter (Signed)
Requested Prescriptions   Signed Prescriptions Disp Refills   amLODipine (NORVASC) 10 MG tablet 30 tablet 0    Sig: Take 1 tablet (10 mg total) by mouth daily.    Authorizing Provider: Antonieta Iba    Ordering User: Guerry Minors

## 2023-04-30 NOTE — Telephone Encounter (Signed)
Left message informing patient 30 day supply has been faxed to local pharmacy.

## 2023-04-30 NOTE — Telephone Encounter (Signed)
*  STAT* If patient is at the pharmacy, call can be transferred to refill team.   1. Which medications need to be refilled? (please list name of each medication and dose if known) amLODipine (NORVASC) 10 MG tablet  2. Which pharmacy/location (including street and city if local pharmacy) is medication to be sent to? [id number] - [place], [place] - [address]   3. Do they need a 30 day or 90 day supply?   Patient's wife is requesting a short-term supply to last until 8/13 appointment. She states the patient has 2 tablets remaining. 7-10 days.

## 2023-05-03 NOTE — Progress Notes (Unsigned)
Cardiology Office Note  Date:  05/04/2023   ID:  Jesus Dillon, Jesus Dillon October 09, 1940, MRN [id number]  PCP:  Berniece Salines, FNP   Chief Complaint  Patient presents with   Follow-up    Patient denies new or acute cardiac problems/concerns today.      HPI:  [name] is a 82 year old gentleman with past medical history of Nonsmoker Prediabetes HTN Hyperlipidemia Heart murmur/aortic valve sclerosis Benign essential tremor  Who presents for f/u of his  mitral valve regurgitation, exercise intolerance  Seen by myself in clinic January 2023 In follow-up today he reports doing relatively well  "Aging" Having difficulty with arthritis , can't exercise Cuts grass, Sedentary, no regular walking program  Denies chest pain or shortness of breath concerning for angina  Taking norvasc 10 daily, micardis 80 mg daily  Lab work reviewed Total chol 142, LDL 64 A1C 5.9  EKG personally reviewed by myself on todays visit EKG Interpretation Date/Time:  Tuesday May 04 2023 16:33:22 EDT Ventricular Rate:  60 PR Interval:  296 QRS Duration:  94 QT Interval:  394 QTC Calculation: 394 R Axis:   51  Text Interpretation: Sinus rhythm with 1st degree A-V block Nonspecific T wave abnormality When compared with ECG of 30-Sep-2021 14:34, Nonspecific T wave abnormality no longer evident in Inferior leads Confirmed by Julien Nordmann [phone]) on 05/04/2023 4:50:30 PM   Other past medical history Stress test Myoview January 12, 2018 normal ejection fraction low risk study  Echocardiogram September 2019 normal ejection fraction mild to moderate mitral valve regurgitation Mild to moderately dilated left atrium  PMH:   has a past medical history of Arthritis, Diastolic dysfunction (06/17/2018), GERD (gastroesophageal reflux disease), Gout, HBP (high blood pressure), Heart murmur, Hyperlipidemia, Left atrial dilation (06/17/2018), Mitral regurgitation (06/17/2018), Pulmonary regurgitation (06/17/2018), and  Tremors of nervous system.  PSH:    Past Surgical History:  Procedure Laterality Date   ANAL FISSURECTOMY     COLONOSCOPY WITH PROPOFOL N/A 08/25/2018   Procedure: COLONOSCOPY WITH PROPOFOL;  Surgeon: Wyline Mood, MD;  Location: [facility] ENDOSCOPY;  Service: Gastroenterology;  Laterality: N/A;   HYDROCELE EXCISION Left 03/01/2018   Procedure: HYDROCELECTOMY ADULT;  Surgeon: Riki Altes, MD;  Location: [facility] ORS;  Service: Urology;  Laterality: Left;    Current Outpatient Medications  Medication Sig Dispense Refill   acetaminophen (TYLENOL) 650 MG CR tablet Take 650 mg by mouth every 8 (eight) hours as needed for pain.     amLODipine (NORVASC) 10 MG tablet Take 1 tablet (10 mg total) by mouth daily. 30 tablet 0   aspirin EC 81 MG tablet Take 1 tablet (81 mg total) by mouth daily. No NSAIDs for at least one hour     atorvastatin (LIPITOR) 40 MG tablet TAKE 1 TABLET AT BEDTIME 90 tablet 3   gabapentin (NEURONTIN) 300 MG capsule TAKE 1 CAPSULE BY MOUTH THREE TIMES A DAY 270 capsule 1   propranolol (INDERAL) 10 MG tablet TAKE 1 TABLET THREE TIMES A DAY 270 tablet 3   telmisartan (MICARDIS) 80 MG tablet TAKE 1 TABLET DAILY 90 tablet 0   Ascorbic Acid (VITAMIN C) 1000 MG tablet Take 1,000 mg by mouth daily. (Patient not taking: Reported on 05/04/2023)     cholecalciferol (VITAMIN D) 1000 units tablet Take 1,000 Units by mouth daily. (Patient not taking: Reported on 05/04/2023)     No current facility-administered medications for this visit.    Allergies:   Celebrex [celecoxib] and Penicillins   Social History:  The patient  reports that he has never smoked. He has never used smokeless tobacco. He reports that he does not drink alcohol and does not use drugs.   Family History:   family history includes Angina in his mother; Blindness in his maternal grandmother; Dementia in his mother; Diabetes in his brother; Hypertension in his brother and mother.   Review of Systems: Review of Systems   Constitutional: Negative.   Respiratory:  Positive for shortness of breath.   Cardiovascular:  Positive for chest pain.  Gastrointestinal: Negative.   Musculoskeletal: Negative.   Neurological: Negative.   Psychiatric/Behavioral: Negative.    All other systems reviewed and are negative.    PHYSICAL EXAM: VS:  BP (!) 148/72 (BP Location: Left Arm, Patient Position: Sitting, Cuff Size: Normal)   Pulse 60   Ht 5' 11.5" (1.816 m)   Wt 222 lb 9.6 oz (101 kg)   SpO2 99%   BMI 30.61 kg/m  , BMI Body mass index is 30.61 kg/m. GEN: Well nourished, well developed, in no acute distress  HEENT: normal  Neck: no JVD, carotid bruits, or masses Cardiac: RRR; no murmurs, rubs, or gallops,no edema  Respiratory:  clear to auscultation bilaterally, normal work of breathing GI: soft, nontender, nondistended, + BS MS: no deformity or atrophy  Skin: warm and dry, no rash Neuro:  Strength and sensation are intact Psych: euthymic mood, full affect  Recent Labs: 11/09/2022: ALT 27; BUN 11; Creat 0.95; Hemoglobin 14.1; Platelets 128; Potassium 4.1; Sodium 139; TSH 2.44    Lipid Panel Lab Results  Component Value Date   CHOL 142 11/09/2022   HDL 62 11/09/2022   LDLCALC 64 11/09/2022   TRIG 80 11/09/2022      Wt Readings from Last 3 Encounters:  05/04/23 222 lb 9.6 oz (101 kg)  02/11/23 220 lb (99.8 kg)  11/09/22 225 lb 12.8 oz (102.4 kg)     ASSESSMENT AND PLAN:  Leg edema Trace edema, minimal on exam From norvasic or fluid retention  Chest pain with moderate risk for cardiac etiology -  Chest pain concerning for angina Nonspecific T wave, no change compared to prior EKGs  Cardiac murmur -  Minimal murmur on exam  Pure hypercholesterolemia Continue statin  Essential hypertension Continue amlodipine and Micardis at current dosing Recommend monitoring blood pressure at home Typically runs 120 systolic  Shortness of breath -  Previously worked up with stress  test Recommend regular walking program   Total encounter time more than 30 minutes  Greater than 50% was spent in counseling and coordination of care with the patient    Orders Placed This Encounter  Procedures   EKG 12-Lead     Signed, Dossie Arbour, M.D., Ph.D. 05/04/2023  [facility] [place], [place] [phone]

## 2023-05-04 ENCOUNTER — Encounter: Payer: Self-pay | Admitting: Cardiovascular Disease

## 2023-05-04 ENCOUNTER — Ambulatory Visit: Payer: Medicare Other | Attending: Cardiovascular Disease | Admitting: Cardiovascular Disease

## 2023-05-04 VITALS — BP 148/72 | HR 60 | Ht 71.5 in | Wt 222.6 lb

## 2023-05-04 DIAGNOSIS — I10 Essential (primary) hypertension: Secondary | ICD-10-CM

## 2023-05-04 DIAGNOSIS — I340 Nonrheumatic mitral (valve) insufficiency: Secondary | ICD-10-CM

## 2023-05-04 DIAGNOSIS — E782 Mixed hyperlipidemia: Secondary | ICD-10-CM | POA: Diagnosis not present

## 2023-05-04 MED ORDER — TELMISARTAN 80 MG PO TABS
80.0000 mg | ORAL_TABLET | Freq: Every day | ORAL | 3 refills | Status: DC
Start: 2023-05-04 — End: 2023-10-07

## 2023-05-04 MED ORDER — AMLODIPINE BESYLATE 10 MG PO TABS: 10.0000 mg | ORAL_TABLET | Freq: Every day | ORAL | 3 refills | Status: DC

## 2023-05-04 NOTE — Patient Instructions (Signed)

## 2023-05-12 ENCOUNTER — Ambulatory Visit: Payer: Medicare Other | Admitting: Nurse Practitioner

## 2023-05-20 NOTE — Progress Notes (Signed)
BP 128/70   Pulse 68   Temp 98.2 F (36.8 C) (Oral)   Resp 16   Ht 6' (1.829 m)   Wt 220 lb 4.8 oz (99.9 kg)   SpO2 99%   BMI 29.88 kg/m    Subjective:    [name], male    DOB: Jan 01, 1941, 82 y.o.   MRN: [id number]  HPI: Jesus Dillon is a 82 y.o. male  Chief Complaint  Patient presents with   Hypertension    6 month follow up   Hyperlipidemia   Hypertension:  -Medications: telmisartan 80 mg daily, amlodipine 10 mg daily -Patient is compliant with above medications and reports no side effects. -Checking BP at home (average): 120s -Denies any SOB, CP, vision changes, LE edema or symptoms of hypotension -Diet: recommend DASH diet  -Exercise: recommend 150 min of physical activity weekly     HLD:  -Medications: atorvastatin 40 mg dialy -Patient is compliant with above medications and reports no side effects.  -Last lipid panel:  Lipid Panel     Component Value Date/Time   CHOL 142 11/09/2022 1542   CHOL 154 10/21/2017 1358   TRIG 80 11/09/2022 1542   HDL 62 11/09/2022 1542   HDL 46 10/21/2017 1358   CHOLHDL 2.3 11/09/2022 1542   LDLCALC 64 11/09/2022 1542   LABVLDL 11 10/21/2017 1358       Thrombocytopenia: Denies any abnormal bruising, blood in his stools or hematuria.  This condition has been stable and chronic. No changes    Latest Ref Rng & Units 11/09/2022    3:42 PM 04/09/2022    4:00 PM 10/22/2020    2:02 PM  CBC  WBC 3.8 - 10.8 Thousand/uL 3.1  3.2  3.3   Hemoglobin 13.2 - 17.1 g/dL 72.5  36.6  44.0   Hematocrit 38.5 - 50.0 % 42.3  41.4  44.5   Platelets 140 - 400 Thousand/uL 128  130  135     Tremors: he currently takes propranolol 10 mg 3 times a day.  Patient has been dealing with tremors for a long time.  He reports that he is doing well and they have improved. Stable, no changes   Chronic right sided back pain: Patient takes gabapentin 300 mg 3 times a day.  Patient states he has had chronic right-sided back pain for a long  time.  Reports medication helps, no changes. He says that when he wakes up it is tight  but as the day progresses it gets better.     Heart murmur/ mitral valve regurgitation : patient managed by cardiology.  He last saw Dr. Mariah Milling on 05/04/2023. Currently taking  telmisartan 80 mg daily, amlodipine 10 mg daily and propanolol 10 mg 3 times a day.  Patient reports he is doing well. No changes.   Tingling in hands: he says that especially at night he notices some tingling in his hands.  Discussed how he sleeps and he does bend his hands.  Recommend putting a wash cloth in his hands to not bend at the wrist.  Checked B12 slightly low.  Will start B12 supplement over the counter.  Will also try wrist splints.  Discussed if no improvement will refer to neuro for nerve testing.    Relevant past medical, surgical, family and social history reviewed and updated as indicated. Interim medical history since our last visit reviewed. Allergies and medications reviewed and updated.  Review of Systems  Constitutional: Negative for fever or  weight change.  Respiratory: Negative for cough and shortness of breath.   Cardiovascular: Negative for chest pain or palpitations.  Gastrointestinal: Negative for abdominal pain, no bowel changes.  Musculoskeletal: Negative for gait problem or joint swelling.  Skin: Negative for rash.  Neurological: Negative for dizziness or headache.  No other specific complaints in a complete review of systems (except as listed in HPI above).      Objective:    BP 128/70   Pulse 68   Temp 98.2 F (36.8 C) (Oral)   Resp 16   Ht 6' (1.829 m)   Wt 220 lb 4.8 oz (99.9 kg)   SpO2 99%   BMI 29.88 kg/m   Wt Readings from Last 3 Encounters:  05/21/23 220 lb 4.8 oz (99.9 kg)  05/04/23 222 lb 9.6 oz (101 kg)  02/11/23 220 lb (99.8 kg)    Physical Exam  Constitutional: Patient appears well-developed and well-nourished. Obese  No distress.  HEENT: head atraumatic, normocephalic,  pupils equal and reactive to light,  neck supple Cardiovascular: Normal rate, regular rhythm and normal heart sounds.  faint murmur heard. No BLE edema. Pulmonary/Chest: Effort normal and breath sounds normal. No respiratory distress. Abdominal: Soft.  There is no tenderness. Psychiatric: Patient has a normal mood and affect. behavior is normal. Judgment and thought content normal.  Results for orders placed or performed in visit on 11/09/22  CBC with Differential/Platelet  Result Value Ref Range   WBC 3.1 (L) 3.8 - 10.8 Thousand/uL   RBC 4.90 4.20 - 5.80 Million/uL   Hemoglobin 14.1 13.2 - 17.1 g/dL   HCT 29.5 62.1 - 30.8 %   MCV 86.3 80.0 - 100.0 fL   MCH 28.8 27.0 - 33.0 pg   MCHC 33.3 32.0 - 36.0 g/dL   RDW 65.7 84.6 - 96.2 %   Platelets 128 (L) 140 - 400 Thousand/uL   MPV 12.3 7.5 - 12.5 fL   Neutro Abs 1,314 (L) 1,500 - 7,800 cells/uL   Lymphs Abs 1,324 850 - 3,900 cells/uL   Absolute Monocytes 341 200 - 950 cells/uL   Eosinophils Absolute 90 15 - 500 cells/uL   Basophils Absolute 31 0 - 200 cells/uL   Neutrophils Relative % 42.4 %   Total Lymphocyte 42.7 %   Monocytes Relative 11.0 %   Eosinophils Relative 2.9 %   Basophils Relative 1.0 %  COMPLETE METABOLIC PANEL WITH GFR  Result Value Ref Range   Glucose, Bld 90 65 - 99 mg/dL   BUN 11 7 - 25 mg/dL   Creat 9.52 8.41 - 3.24 mg/dL   eGFR 81 > OR = 60 MW/NUU/7.25D6   BUN/Creatinine Ratio SEE NOTE: 6 - 22 (calc)   Sodium 139 135 - 146 mmol/L   Potassium 4.1 3.5 - 5.3 mmol/L   Chloride 105 98 - 110 mmol/L   CO2 26 20 - 32 mmol/L   Calcium 9.5 8.6 - 10.3 mg/dL   Total Protein 8.5 (H) 6.1 - 8.1 g/dL   Albumin 4.4 3.6 - 5.1 g/dL   Globulin 4.1 (H) 1.9 - 3.7 g/dL (calc)   AG Ratio 1.1 1.0 - 2.5 (calc)   Total Bilirubin 0.7 0.2 - 1.2 mg/dL   Alkaline phosphatase (APISO) 70 35 - 144 U/L   AST 28 10 - 35 U/L   ALT 27 9 - 46 U/L  Lipid panel  Result Value Ref Range   Cholesterol 142 <200 mg/dL   HDL 62 > OR = 40 mg/dL  Triglycerides 80 <150 mg/dL   LDL Cholesterol (Calc) 64 mg/dL (calc)   Total CHOL/HDL Ratio 2.3 <5.0 (calc)   Non-HDL Cholesterol (Calc) 80 <409 mg/dL (calc)  Vitamin W11  Result Value Ref Range   Vitamin B-12 442 200 - 1,100 pg/mL  TSH  Result Value Ref Range   TSH 2.44 0.40 - 4.50 mIU/L      Assessment & Plan:   Problem List Items Addressed This Visit       Cardiovascular and Mediastinum   Essential hypertension - Primary    Managed by cardiology currently on telmisartan 80  mg dail, amlodipine 10 mg daily and atorvastatin 40 mg daily      Relevant Orders   CBC with Differential/Platelet   COMPLETE METABOLIC PANEL WITH GFR   Mitral regurgitation    Managed by cardiology currently on telmisartan 80  mg dail, amlodipine 10 mg daily and atorvastatin 40 mg daily      Relevant Orders   CBC with Differential/Platelet   COMPLETE METABOLIC PANEL WITH GFR   Lipid panel   Tricuspid regurgitation    Managed by cardiology currently on telmisartan 80  mg dail, amlodipine 10 mg daily and atorvastatin 40 mg daily      Relevant Orders   CBC with Differential/Platelet   COMPLETE METABOLIC PANEL WITH GFR   Lipid panel     Nervous and Auditory   Chronic right-sided low back pain with right-sided sciatica    Patient takes gabapentin 300 mg 3 times a day        Hematopoietic and Hemostatic   Thrombocytopenia (HCC)    Stable, no changes      Relevant Orders   CBC with Differential/Platelet     Other   Hyperlipidemia (Chronic)    Taking atorvastatin 40 mg daily      Relevant Orders   COMPLETE METABOLIC PANEL WITH GFR   Lipid panel   Cardiac murmur    Managed by cardiology currently on telmisartan 80  mg dail, amlodipine 10 mg daily and atorvastatin 40 mg daily      Relevant Orders   CBC with Differential/Platelet   COMPLETE METABOLIC PANEL WITH GFR   Lipid panel   Tremors of nervous system    Propanolol 10 mg three times a day      Other Visit Diagnoses      Numbness and tingling in both hands       start b12 supplement, try wrist splints if no improvement will refer to neurology         Follow up plan: Return in about 6 months (around 11/19/2023) for follow up.

## 2023-05-21 ENCOUNTER — Ambulatory Visit: Payer: Medicare Other | Admitting: Nurse Practitioner

## 2023-05-21 ENCOUNTER — Encounter: Payer: Self-pay | Admitting: Nurse Practitioner

## 2023-05-21 ENCOUNTER — Other Ambulatory Visit: Payer: Self-pay

## 2023-05-21 VITALS — BP 128/70 | HR 68 | Temp 98.2°F | Resp 16 | Ht 72.0 in | Wt 220.3 lb

## 2023-05-21 DIAGNOSIS — E782 Mixed hyperlipidemia: Secondary | ICD-10-CM

## 2023-05-21 DIAGNOSIS — D696 Thrombocytopenia, unspecified: Secondary | ICD-10-CM

## 2023-05-21 DIAGNOSIS — I10 Essential (primary) hypertension: Secondary | ICD-10-CM

## 2023-05-21 DIAGNOSIS — G8929 Other chronic pain: Secondary | ICD-10-CM

## 2023-05-21 DIAGNOSIS — I361 Nonrheumatic tricuspid (valve) insufficiency: Secondary | ICD-10-CM | POA: Diagnosis not present

## 2023-05-21 DIAGNOSIS — R202 Paresthesia of skin: Secondary | ICD-10-CM

## 2023-05-21 DIAGNOSIS — I340 Nonrheumatic mitral (valve) insufficiency: Secondary | ICD-10-CM

## 2023-05-21 DIAGNOSIS — M5441 Lumbago with sciatica, right side: Secondary | ICD-10-CM

## 2023-05-21 DIAGNOSIS — R200 Anesthesia of skin: Secondary | ICD-10-CM

## 2023-05-21 DIAGNOSIS — R011 Cardiac murmur, unspecified: Secondary | ICD-10-CM

## 2023-05-21 DIAGNOSIS — R251 Tremor, unspecified: Secondary | ICD-10-CM

## 2023-05-21 NOTE — Assessment & Plan Note (Signed)
Stable, no changes  

## 2023-05-21 NOTE — Assessment & Plan Note (Signed)
Managed by cardiology currently on telmisartan 80  mg dail, amlodipine 10 mg daily and atorvastatin 40 mg daily

## 2023-05-21 NOTE — Assessment & Plan Note (Signed)
Patient takes gabapentin 300 mg 3 times a day

## 2023-05-21 NOTE — Assessment & Plan Note (Signed)
Propanolol 10 mg three times a day

## 2023-05-21 NOTE — Assessment & Plan Note (Signed)
Taking atorvastatin 40mg daily.

## 2023-05-22 LAB — COMPLETE METABOLIC PANEL WITH GFR
AG Ratio: 1.4 (calc) (ref 1.0–2.5)
ALT: 41 U/L (ref 9–46)
AST: 34 U/L (ref 10–35)
Albumin: 4.7 g/dL (ref 3.6–5.1)
Alkaline phosphatase (APISO): 67 U/L (ref 35–144)
BUN: 11 mg/dL (ref 7–25)
CO2: 24 mmol/L (ref 20–32)
Calcium: 9.4 mg/dL (ref 8.6–10.3)
Chloride: 105 mmol/L (ref 98–110)
Creat: 1.09 mg/dL (ref 0.70–1.22)
Globulin: 3.3 g/dL (ref 1.9–3.7)
Glucose, Bld: 86 mg/dL (ref 65–99)
Potassium: 4.2 mmol/L (ref 3.5–5.3)
Sodium: 138 mmol/L (ref 135–146)
Total Bilirubin: 0.8 mg/dL (ref 0.2–1.2)
Total Protein: 8 g/dL (ref 6.1–8.1)
eGFR: 69 mL/min/{1.73_m2} (ref >=60)

## 2023-05-22 LAB — CBC WITH DIFFERENTIAL/PLATELET
Absolute Monocytes: 390 {cells}/uL (ref 200–950)
Basophils Absolute: 30 {cells}/uL (ref 0–200)
Basophils Relative: 1 %
Eosinophils Absolute: 129 {cells}/uL (ref 15–500)
Eosinophils Relative: 4.3 %
HCT: 42.7 % (ref 38.5–50.0)
Hemoglobin: 13.9 g/dL (ref 13.2–17.1)
Lymphs Abs: 1464 {cells}/uL (ref 850–3900)
MCH: 28.2 pg (ref 27.0–33.0)
MCHC: 32.6 g/dL (ref 32.0–36.0)
MCV: 86.6 fL (ref 80.0–100.0)
MPV: 11.5 fL (ref 7.5–12.5)
Monocytes Relative: 13 %
Neutro Abs: 987 {cells}/uL — ABNORMAL LOW (ref 1500–7800)
Neutrophils Relative %: 32.9 %
Platelets: 120 10*3/uL — ABNORMAL LOW (ref 140–400)
RBC: 4.93 10*6/uL (ref 4.20–5.80)
RDW: 13.2 % (ref 11.0–15.0)
Total Lymphocyte: 48.8 %
WBC: 3 10*3/uL — ABNORMAL LOW (ref 3.8–10.8)

## 2023-05-22 LAB — LIPID PANEL
Cholesterol: 131 mg/dL (ref <200)
HDL: 56 mg/dL (ref >=40)
LDL Cholesterol (Calc): 61 mg/dL
Non-HDL Cholesterol (Calc): 75 mg/dL (ref <130)
Total CHOL/HDL Ratio: 2.3 (calc) (ref <5.0)
Triglycerides: 65 mg/dL (ref <150)

## 2023-05-25 ENCOUNTER — Telehealth: Payer: Self-pay | Admitting: Nurse Practitioner

## 2023-05-25 NOTE — Telephone Encounter (Signed)
Pt wife is calling to discuss lab work with WBC platelets are low - is this a concern?  Please advise CB- [phone]

## 2023-05-26 NOTE — Telephone Encounter (Signed)
Left message for patient

## 2023-05-27 NOTE — Telephone Encounter (Unsigned)
Copied from CRM [id number]. Topic: General - Inquiry >> May 27, 2023  2:20 PM [name] wrote: Reason for CRM: The spouse of the patient called requesting Jesus Dillon to give a call back regarding the patients recent lab results. Please assist patient further

## 2023-05-31 NOTE — Telephone Encounter (Signed)
Patient wife notified.

## 2023-06-09 ENCOUNTER — Other Ambulatory Visit: Payer: Self-pay | Admitting: Nurse Practitioner

## 2023-06-09 DIAGNOSIS — G8929 Other chronic pain: Secondary | ICD-10-CM

## 2023-06-10 NOTE — Telephone Encounter (Signed)
Requested Prescriptions  Pending Prescriptions Disp Refills   gabapentin (NEURONTIN) 300 MG capsule [Pharmacy Med Name: GABAPENTIN CAPS 300MG ] 270 capsule 3    Sig: TAKE 1 CAPSULE THREE TIMES A DAY     Neurology: Anticonvulsants - gabapentin Passed - 06/09/2023  3:02 PM      Passed - Cr in normal range and within 360 days    Creat  Date Value Ref Range Status  05/21/2023 1.09 0.70 - 1.22 mg/dL Final         Passed - Completed PHQ-2 or PHQ-9 in the last 360 days      Passed - Valid encounter within last 12 months    Recent Outpatient Visits           2 weeks ago Essential hypertension   [facility] [place] Berniece Salines, FNP   7 months ago Essential hypertension   [facility] Berniece Salines, FNP   10 months ago Upper respiratory tract infection, unspecified type   [facility] Danelle Berry, PA-C   1 year ago Essential hypertension   [facility] Berniece Salines, FNP   1 year ago Acute nonintractable headache, unspecified headache type   [facility] Danelle Berry, PA-C       Future Appointments             In 5 months Zane Herald, Rudolpho Sevin, FNP [facility]

## 2023-07-29 ENCOUNTER — Ambulatory Visit: Payer: Medicare Other | Admitting: Nurse Practitioner

## 2023-07-29 ENCOUNTER — Encounter: Payer: Self-pay | Admitting: Nurse Practitioner

## 2023-07-29 ENCOUNTER — Other Ambulatory Visit: Payer: Self-pay

## 2023-07-29 VITALS — BP 132/78 | HR 96 | Temp 98.6°F | Resp 16 | Ht 72.0 in | Wt 220.3 lb

## 2023-07-29 DIAGNOSIS — R634 Abnormal weight loss: Secondary | ICD-10-CM

## 2023-07-29 NOTE — Progress Notes (Signed)
BP 132/78   Pulse 96   Temp 98.6 F (37 C) (Oral)   Resp 16   Ht 6' (1.829 m)   Wt 220 lb 4.8 oz (99.9 kg)   SpO2 97%   BMI 29.88 kg/m    Subjective:    [name], male    DOB: 09-08-1941, 82 y.o.   MRN: [id number]  HPI: Jesus Dillon is a 82 y.o. male  Chief Complaint  Patient presents with   Weight Loss   Unintentional weight loss: patient reports he has had unintentional weight loss.  His weight a year ago was 218 lbs.  Today it is 220 lbs.  Patient reports he went to his podiatrist who was concerned regarding his weight loss.  He reports he has noticed that his clothes are bigger and he can feel his bones.  Upon evaluating his weights over the last year he has ranged from 225-218.  Discussed with patient that it looks like his weight is stable. Patient still concerned and would like to have work up.      07/29/2023    1:38 PM 05/21/2023    2:00 PM 05/04/2023    4:29 PM  Vitals with BMI  Height 6\' 0"  6\' 0"  5' 11.5"  Weight 220 lbs 5 oz 220 lbs 5 oz 222 lbs 10 oz  BMI 29.87 29.87 30.62  Systolic 132 128 409  Diastolic 78 70 72  Pulse 96 68 60    Will get labs, chest xray and stool cards x3.    Relevant past medical, surgical, family and social history reviewed and updated as indicated. Interim medical history since our last visit reviewed. Allergies and medications reviewed and updated.  Review of Systems  Constitutional: Negative for fever, negative for weight change.  Respiratory: Negative for cough and shortness of breath.   Cardiovascular: Negative for chest pain or palpitations.  Gastrointestinal: Negative for abdominal pain, no bowel changes.  Musculoskeletal: Negative for gait problem or joint swelling.  Skin: Negative for rash.  Neurological: Negative for dizziness or headache.  No other specific complaints in a complete review of systems (except as listed in HPI above).      Objective:    BP 132/78   Pulse 96   Temp 98.6 F (37 C)  (Oral)   Resp 16   Ht 6' (1.829 m)   Wt 220 lb 4.8 oz (99.9 kg)   SpO2 97%   BMI 29.88 kg/m   Wt Readings from Last 3 Encounters:  07/29/23 220 lb 4.8 oz (99.9 kg)  05/21/23 220 lb 4.8 oz (99.9 kg)  05/04/23 222 lb 9.6 oz (101 kg)    Physical Exam  Constitutional: Patient appears well-developed and well-nourished. Obese  No distress.  HEENT: head atraumatic, normocephalic, pupils equal and reactive to light, neck supple, throat within normal limits Cardiovascular: Normal rate, regular rhythm and normal heart sounds.  No murmur heard. No BLE edema. Pulmonary/Chest: Effort normal and breath sounds normal. No respiratory distress. Abdominal: Soft.  There is no tenderness. Psychiatric: Patient has a normal mood and affect. behavior is normal. Judgment and thought content normal.  Results for orders placed or performed in visit on 05/21/23  CBC with Differential/Platelet  Result Value Ref Range   WBC 3.0 (L) 3.8 - 10.8 Thousand/uL   RBC 4.93 4.20 - 5.80 Million/uL   Hemoglobin 13.9 13.2 - 17.1 g/dL   HCT 81.1 91.4 - 78.2 %   MCV 86.6 80.0 - 100.0 fL  MCH 28.2 27.0 - 33.0 pg   MCHC 32.6 32.0 - 36.0 g/dL   RDW 16.1 09.6 - 04.5 %   Platelets 120 (L) 140 - 400 Thousand/uL   MPV 11.5 7.5 - 12.5 fL   Neutro Abs 987 (L) 1,500 - 7,800 cells/uL   Lymphs Abs 1,464 850 - 3,900 cells/uL   Absolute Monocytes 390 200 - 950 cells/uL   Eosinophils Absolute 129 15 - 500 cells/uL   Basophils Absolute 30 0 - 200 cells/uL   Neutrophils Relative % 32.9 %   Total Lymphocyte 48.8 %   Monocytes Relative 13.0 %   Eosinophils Relative 4.3 %   Basophils Relative 1.0 %  COMPLETE METABOLIC PANEL WITH GFR  Result Value Ref Range   Glucose, Bld 86 65 - 99 mg/dL   BUN 11 7 - 25 mg/dL   Creat 4.09 8.11 - 9.14 mg/dL   eGFR 69 > OR = 60 NW/GNF/6.21H0   BUN/Creatinine Ratio SEE NOTE: 6 - 22 (calc)   Sodium 138 135 - 146 mmol/L   Potassium 4.2 3.5 - 5.3 mmol/L   Chloride 105 98 - 110 mmol/L   CO2 24 20 -  32 mmol/L   Calcium 9.4 8.6 - 10.3 mg/dL   Total Protein 8.0 6.1 - 8.1 g/dL   Albumin 4.7 3.6 - 5.1 g/dL   Globulin 3.3 1.9 - 3.7 g/dL (calc)   AG Ratio 1.4 1.0 - 2.5 (calc)   Total Bilirubin 0.8 0.2 - 1.2 mg/dL   Alkaline phosphatase (APISO) 67 35 - 144 U/L   AST 34 10 - 35 U/L   ALT 41 9 - 46 U/L  Lipid panel  Result Value Ref Range   Cholesterol 131 <200 mg/dL   HDL 56 > OR = 40 mg/dL   Triglycerides 65 <865 mg/dL   LDL Cholesterol (Calc) 61 mg/dL (calc)   Total CHOL/HDL Ratio 2.3 <5.0 (calc)   Non-HDL Cholesterol (Calc) 75 <784 mg/dL (calc)      Assessment & Plan:   Problem List Items Addressed This Visit   None Visit Diagnoses     Unintentional weight loss    -  Primary   will get labs, chest xray and stool cards.   Relevant Orders   CBC with Differential/Platelet   COMPLETE METABOLIC PANEL WITH GFR   TSH   HIV Antibody (routine testing w rflx)   C-reactive protein   Sedimentation rate   DG Chest 2 View        Follow up plan: Return if symptoms worsen or fail to improve.

## 2023-07-30 ENCOUNTER — Other Ambulatory Visit: Payer: Self-pay | Admitting: Nurse Practitioner

## 2023-07-30 ENCOUNTER — Telehealth: Payer: Self-pay | Admitting: Nurse Practitioner

## 2023-07-30 DIAGNOSIS — D696 Thrombocytopenia, unspecified: Secondary | ICD-10-CM

## 2023-07-30 DIAGNOSIS — D709 Neutropenia, unspecified: Secondary | ICD-10-CM

## 2023-07-30 LAB — COMPLETE METABOLIC PANEL WITH GFR
AG Ratio: 1.3 (calc) (ref 1.0–2.5)
ALT: 27 U/L (ref 9–46)
AST: 29 U/L (ref 10–35)
Albumin: 4.2 g/dL (ref 3.6–5.1)
Alkaline phosphatase (APISO): 66 U/L (ref 35–144)
BUN: 14 mg/dL (ref 7–25)
CO2: 26 mmol/L (ref 20–32)
Calcium: 9 mg/dL (ref 8.6–10.3)
Chloride: 106 mmol/L (ref 98–110)
Creat: 1 mg/dL (ref 0.70–1.22)
Globulin: 3.3 g/dL (ref 1.9–3.7)
Glucose, Bld: 79 mg/dL (ref 65–99)
Potassium: 3.9 mmol/L (ref 3.5–5.3)
Sodium: 139 mmol/L (ref 135–146)
Total Bilirubin: 0.6 mg/dL (ref 0.2–1.2)
Total Protein: 7.5 g/dL (ref 6.1–8.1)
eGFR: 76 mL/min/{1.73_m2} (ref >=60)

## 2023-07-30 LAB — TSH: TSH: 2 m[IU]/L (ref 0.40–4.50)

## 2023-07-30 LAB — CBC WITH DIFFERENTIAL/PLATELET
Absolute Lymphocytes: 1362 {cells}/uL (ref 850–3900)
Absolute Monocytes: 396 {cells}/uL (ref 200–950)
Basophils Absolute: 30 {cells}/uL (ref 0–200)
Basophils Relative: 1 %
Eosinophils Absolute: 93 {cells}/uL (ref 15–500)
Eosinophils Relative: 3.1 %
HCT: 40.9 % (ref 38.5–50.0)
Hemoglobin: 13.3 g/dL (ref 13.2–17.1)
MCH: 28.7 pg (ref 27.0–33.0)
MCHC: 32.5 g/dL (ref 32.0–36.0)
MCV: 88.3 fL (ref 80.0–100.0)
MPV: 11.5 fL (ref 7.5–12.5)
Monocytes Relative: 13.2 %
Neutro Abs: 1119 {cells}/uL — ABNORMAL LOW (ref 1500–7800)
Neutrophils Relative %: 37.3 %
Platelets: 119 10*3/uL — ABNORMAL LOW (ref 140–400)
RBC: 4.63 10*6/uL (ref 4.20–5.80)
RDW: 13.1 % (ref 11.0–15.0)
Total Lymphocyte: 45.4 %
WBC: 3 10*3/uL — ABNORMAL LOW (ref 3.8–10.8)

## 2023-07-30 LAB — C-REACTIVE PROTEIN: CRP: <3 mg/L (ref <8.0)

## 2023-07-30 LAB — SEDIMENTATION RATE: Sed Rate: 2 mm/h (ref 0–20)

## 2023-07-30 LAB — HIV ANTIBODY (ROUTINE TESTING W REFLEX): HIV 1&2 Ab, 4th Generation: NONREACTIVE

## 2023-07-30 NOTE — Telephone Encounter (Signed)
Pt. And wife given lab results and instructions. Verbalizes understanding.

## 2023-08-02 ENCOUNTER — Telehealth: Payer: Self-pay | Admitting: *Deleted

## 2023-08-02 NOTE — Telephone Encounter (Signed)
Pt's wife called in returning call to [place]. Please cb if need be

## 2023-08-02 NOTE — Telephone Encounter (Signed)
  Chief Complaint: Advise Symptoms: NA Frequency: NA Pertinent Negatives: Patient denies NA Disposition: [] ED /[] Urgent Care (no appt availability in office) / [] Appointment(In office/virtual)/ []  [place] [facility]/ [] Home Care/ [] Refused Recommended Disposition /[] [facility]/ []  Follow-up with PCP Additional Notes:  Pt's wife calling. States they received a call from [phone] and cannot retrieve message on pt's phone. SHe is worried it came from the practice. Did not see any calls placed today. Advised referral was ordered. States if anyone at practice needs to speak with them they can call back.

## 2023-08-06 LAB — HM DIABETES EYE EXAM

## 2023-08-08 NOTE — Progress Notes (Unsigned)
[facility] CONSULT NOTE  Patient Care Team: Berniece Salines, FNP as PCP - General (Nurse Practitioner) Antonieta Iba, MD as Consulting Physician (Cardiology) Riki Altes, MD as Consulting Physician (Urology) Juanell Fairly, MD as Consulting Physician (Orthopedic Surgery) Earna Coder, MD as Consulting Physician (Internal Medicine)  CHIEF COMPLAINTS/PURPOSE OF CONSULTATION: Thrombocytopenia  HISTORY OF PRESENTING ILLNESS: Patient ambulating-independently.  Alone.  Jesus Dillon 82 y.o.  male referred to [place] for further evaluation of thrombocytopenia/mild leukopenia.  Patient denies any easy bruising or bleeding.  Denies any frequent infections.  Admits to possible weight loss-subjective feeling of weight loss.  Denies any new medications.  Denies any anticoagulants.  Denies history of any hepatitis or HIV.  No liver disease.  Denies any alcohol use or abuse.  Review of Systems  Constitutional:  Positive for weight loss. Negative for chills, diaphoresis, fever and malaise/fatigue.  HENT:  Negative for nosebleeds and sore throat.   Eyes:  Negative for double vision.  Respiratory:  Negative for cough, hemoptysis, sputum production, shortness of breath and wheezing.   Cardiovascular:  Negative for chest pain, palpitations, orthopnea and leg swelling.  Gastrointestinal:  Negative for abdominal pain, blood in stool, constipation, diarrhea, heartburn, melena, nausea and vomiting.  Genitourinary:  Negative for dysuria, frequency and urgency.  Musculoskeletal:  Positive for back pain. Negative for joint pain.  Skin: Negative.  Negative for itching and rash.  Neurological:  Negative for dizziness, tingling, focal weakness, weakness and headaches.  Endo/Heme/Allergies:  Does not bruise/bleed easily.  Psychiatric/Behavioral:  Negative for depression. The patient is not nervous/anxious and does not have insomnia.      MEDICAL HISTORY:  Past Medical History:   Diagnosis Date   Arthritis    Diastolic dysfunction 06/17/2018   Grade 1, echo Sept 2019   GERD (gastroesophageal reflux disease)    Gout    HBP (high blood pressure)    Heart murmur    Hyperlipidemia    Left atrial dilation 06/17/2018   Echo Sept 2019   Mitral regurgitation 06/17/2018   Echo Sept 2019   Pulmonary regurgitation 06/17/2018   Echo Sept 2019   Tremors of nervous system     SURGICAL HISTORY: Past Surgical History:  Procedure Laterality Date   ANAL FISSURECTOMY     COLONOSCOPY WITH PROPOFOL N/A 08/25/2018   Procedure: COLONOSCOPY WITH PROPOFOL;  Surgeon: Wyline Mood, MD;  Location: [facility] ENDOSCOPY;  Service: Gastroenterology;  Laterality: N/A;   HYDROCELE EXCISION Left 03/01/2018   Procedure: HYDROCELECTOMY ADULT;  Surgeon: Riki Altes, MD;  Location: [facility] ORS;  Service: Urology;  Laterality: Left;    SOCIAL HISTORY: Social History   Socioeconomic History   Marital status: Married    Spouse name: Dianne   Number of children: 1   Years of education: Not on file   Highest education level: Master's degree (e.g., MA, MS, MEng, MEd, MSW, MBA)  Occupational History   Occupation: Retired  Tobacco Use   Smoking status: Never   Smokeless tobacco: Never   Tobacco comments:    smoking cessation materials not required  Vaping Use   Vaping status: Never Used  Substance and Sexual Activity   Alcohol use: No    Alcohol/week: 0.0 standard drinks of alcohol   Drug use: No   Sexual activity: Yes    Partners: Female  Other Topics Concern   Not on file  Social History Narrative   Not on file   Social Determinants of Health   Financial  Resource Strain: Low Risk  (02/11/2023)   Overall Financial Resource Strain (CARDIA)    Difficulty of Paying Living Expenses: Not hard at all  Food Insecurity: No Food Insecurity (02/11/2023)   Hunger Vital Sign    Worried About Running Out of Food in the Last Year: Never true    Ran Out of Food in the Last Year: Never true   Transportation Needs: No Transportation Needs (02/11/2023)   PRAPARE - Administrator, Civil Service (Medical): No    Lack of Transportation (Non-Medical): No  Physical Activity: Inactive (02/11/2023)   Exercise Vital Sign    Days of Exercise per Week: 0 days    Minutes of Exercise per Session: 0 min  Stress: No Stress Concern Present (02/11/2023)   [employer] of Occupational Health - Occupational Stress Questionnaire    Feeling of Stress : Not at all  Social Connections: Socially Integrated (02/11/2023)   Social Connection and Isolation Panel [NHANES]    Frequency of Communication with Friends and Family: More than three times a week    Frequency of Social Gatherings with Friends and Family: Not on file    Attends Religious Services: More than 4 times per year    Active Member of [employer] or Organizations: Yes    Attends Banker Meetings: More than 4 times per year    Marital Status: Married  Catering manager Violence: Not At Risk (02/11/2023)   Humiliation, Afraid, Rape, and Kick questionnaire    Fear of Current or Ex-Partner: No    Emotionally Abused: No    Physically Abused: No    Sexually Abused: No    FAMILY HISTORY: Family History  Problem Relation Age of Onset   Hypertension Mother    Dementia Mother    Angina Mother    Diabetes Brother    Hypertension Brother    Blindness Maternal Grandmother     ALLERGIES:  is allergic to celebrex [celecoxib] and penicillins.  MEDICATIONS:  Current Outpatient Medications  Medication Sig Dispense Refill   acetaminophen (TYLENOL) 650 MG CR tablet Take 650 mg by mouth every 8 (eight) hours as needed for pain.     amLODipine (NORVASC) 10 MG tablet Take 1 tablet (10 mg total) by mouth daily. 90 tablet 3   atorvastatin (LIPITOR) 40 MG tablet TAKE 1 TABLET AT BEDTIME 90 tablet 3   gabapentin (NEURONTIN) 300 MG capsule TAKE 1 CAPSULE THREE TIMES A DAY 270 capsule 1   propranolol (INDERAL) 10 MG tablet TAKE 1  TABLET THREE TIMES A DAY 270 tablet 3   telmisartan (MICARDIS) 80 MG tablet Take 1 tablet (80 mg total) by mouth daily. 90 tablet 3   No current facility-administered medications for this visit.    PHYSICAL EXAMINATION:  Vitals:   08/09/23 1044  BP: (!) 155/80  Pulse: (!) 58  Temp: (!) 96.7 F (35.9 C)  SpO2: 100%   Filed Weights   08/09/23 1044  Weight: 220 lb 6.4 oz (100 kg)    Physical Exam Vitals and nursing note reviewed.  HENT:     Head: Normocephalic and atraumatic.     Mouth/Throat:     Pharynx: Oropharynx is clear.  Eyes:     Extraocular Movements: Extraocular movements intact.     Pupils: Pupils are equal, round, and reactive to light.  Cardiovascular:     Rate and Rhythm: Normal rate and regular rhythm.  Pulmonary:     Comments: Decreased breath sounds bilaterally.  Abdominal:  Palpations: Abdomen is soft.  Musculoskeletal:        General: Normal range of motion.     Cervical back: Normal range of motion.  Skin:    General: Skin is warm.  Neurological:     General: No focal deficit present.     Mental Status: He is alert and oriented to person, place, and time.  Psychiatric:        Behavior: Behavior normal.        Judgment: Judgment normal.     LABORATORY DATA:  I have reviewed the data as listed Lab Results  Component Value Date   WBC 3.0 (L) 07/29/2023   HGB 13.3 07/29/2023   HCT 40.9 07/29/2023   MCV 88.3 07/29/2023   PLT 119 (L) 07/29/2023   Recent Labs    11/09/22 1542 05/21/23 1459 07/29/23 1355  NA 139 138 139  K 4.1 4.2 3.9  CL 105 105 106  CO2 26 24 26   GLUCOSE 90 86 79  BUN 11 11 14   CREATININE 0.95 1.09 1.00  CALCIUM 9.5 9.4 9.0  PROT 8.5* 8.0 7.5  AST 28 34 29  ALT 27 41 27  BILITOT 0.7 0.8 0.6    RADIOGRAPHIC STUDIES: I have personally reviewed the radiological images as listed and agreed with the findings in the report. No results found.  Thrombocytopenia (HCC) # Thrombocytopenia [since 2018]:  Mild/moderate; mild to moderate Neutropenia [ANC 1200]. Hb is normal.  Asymptomatic.  The etiology is unclear.    # I had a long discussion with patient regarding multiple etiologies including but not limited to liver disease/medications/autoimmune disease-ITP etc.  Less likely any malignant causes.  Discussed regarding a bone marrow biopsy for further evaluation; however would hold for now given the chronicity of the lab abnormalities/and also given the mild abnormalities.   At this time I would recommend further work-up including-CBC CMP LDH; review of peripheral smear; HIV/hepatitis work-up.  Ultrasound abdomen-to rule out cirrhosis/splenomegaly.  # HTN- well controlled.   # ?  Subjective weight loss-however no objective evidence; weight stable over the last few months.  Monitor for now  Thank you, Ms. Pender for allowing me to participate in the care of your pleasant patient. Please do not hesitate to contact me with questions or concerns in the interim.  # DISPOSITION: # labs today-  # US abdomen ASAP # follow up in 2-3 weeks- MD; no labs- Dr.B  All questions were answered. The patient knows to call the clinic with any problems, questions or concerns.     Earna Coder, MD 08/09/2023 11:53 AM

## 2023-08-09 ENCOUNTER — Inpatient Hospital Stay: Payer: Medicare Other | Attending: Internal Medicine | Admitting: Internal Medicine

## 2023-08-09 ENCOUNTER — Inpatient Hospital Stay: Payer: Medicare Other

## 2023-08-09 ENCOUNTER — Encounter: Payer: Self-pay | Admitting: Internal Medicine

## 2023-08-09 VITALS — BP 155/80 | HR 58 | Temp 96.7°F | Ht 72.0 in | Wt 220.4 lb

## 2023-08-09 DIAGNOSIS — D72819 Decreased white blood cell count, unspecified: Secondary | ICD-10-CM | POA: Diagnosis not present

## 2023-08-09 DIAGNOSIS — D696 Thrombocytopenia, unspecified: Secondary | ICD-10-CM | POA: Insufficient documentation

## 2023-08-09 LAB — TECHNOLOGIST SMEAR REVIEW
Plt Morphology: DECREASED
RBC MORPHOLOGY: NORMAL
WBC MORPHOLOGY: NORMAL

## 2023-08-09 LAB — COMPREHENSIVE METABOLIC PANEL
ALT: 36 U/L (ref 0–44)
AST: 35 U/L (ref 15–41)
Albumin: 4.6 g/dL (ref 3.5–5.0)
Alkaline Phosphatase: 67 U/L (ref 38–126)
Anion gap: 9 (ref 5–15)
BUN: 11 mg/dL (ref 8–23)
CO2: 25 mmol/L (ref 22–32)
Calcium: 9.3 mg/dL (ref 8.9–10.3)
Chloride: 103 mmol/L (ref 98–111)
Creatinine, Ser: 1.01 mg/dL (ref 0.61–1.24)
GFR, Estimated: >60 mL/min (ref >60)
Glucose, Bld: 104 mg/dL — ABNORMAL HIGH (ref 70–99)
Potassium: 4.2 mmol/L (ref 3.5–5.1)
Sodium: 137 mmol/L (ref 135–145)
Total Bilirubin: 1.3 mg/dL — ABNORMAL HIGH (ref <1.2)
Total Protein: 8.6 g/dL — ABNORMAL HIGH (ref 6.5–8.1)

## 2023-08-09 LAB — HIV ANTIBODY (ROUTINE TESTING W REFLEX): HIV Screen 4th Generation wRfx: NONREACTIVE

## 2023-08-09 LAB — CBC WITH DIFFERENTIAL/PLATELET
Abs Immature Granulocytes: 0.01 10*3/uL (ref 0.00–0.07)
Basophils Absolute: 0 10*3/uL (ref 0.0–0.1)
Basophils Relative: 1 %
Eosinophils Absolute: 0.1 10*3/uL (ref 0.0–0.5)
Eosinophils Relative: 3 %
HCT: 42.5 % (ref 39.0–52.0)
Hemoglobin: 14.3 g/dL (ref 13.0–17.0)
Immature Granulocytes: 0 %
Lymphocytes Relative: 51 %
Lymphs Abs: 1.4 10*3/uL (ref 0.7–4.0)
MCH: 28.8 pg (ref 26.0–34.0)
MCHC: 33.6 g/dL (ref 30.0–36.0)
MCV: 85.5 fL (ref 80.0–100.0)
Monocytes Absolute: 0.3 10*3/uL (ref 0.1–1.0)
Monocytes Relative: 12 %
Neutro Abs: 0.9 10*3/uL — ABNORMAL LOW (ref 1.7–7.7)
Neutrophils Relative %: 33 %
Platelets: 114 10*3/uL — ABNORMAL LOW (ref 150–400)
RBC: 4.97 MIL/uL (ref 4.22–5.81)
RDW: 14.1 % (ref 11.5–15.5)
WBC: 2.7 10*3/uL — ABNORMAL LOW (ref 4.0–10.5)
nRBC: 0 % (ref 0.0–0.2)

## 2023-08-09 LAB — HEPATITIS B CORE ANTIBODY, IGM: Hep B C IgM: NONREACTIVE

## 2023-08-09 LAB — HEPATITIS C ANTIBODY: HCV Ab: NONREACTIVE

## 2023-08-09 LAB — VITAMIN B12: Vitamin B-12: 228 pg/mL (ref 180–914)

## 2023-08-09 LAB — HEPATITIS B SURFACE ANTIGEN: Hepatitis B Surface Ag: NONREACTIVE

## 2023-08-09 LAB — LACTATE DEHYDROGENASE: LDH: 250 U/L — ABNORMAL HIGH (ref 98–192)

## 2023-08-09 NOTE — Progress Notes (Signed)
C/o of feeling his bones, c/o losing weight. Not sure why he's losing wt.

## 2023-08-09 NOTE — Assessment & Plan Note (Addendum)
#   Thrombocytopenia [since 2018]: Mild/moderate; mild to moderate Neutropenia [ANC 1200]. Hb is normal.  Asymptomatic.  The etiology is unclear.    # I had a long discussion with patient regarding multiple etiologies including but not limited to liver disease/medications/autoimmune disease-ITP etc.  Less likely any malignant causes.  Discussed regarding a bone marrow biopsy for further evaluation; however would hold for now given the chronicity of the lab abnormalities/and also given the mild abnormalities.   At this time I would recommend further work-up including-CBC CMP LDH; review of peripheral smear; HIV/hepatitis work-up.  Ultrasound abdomen-to rule out cirrhosis/splenomegaly.  # HTN- well controlled.   # ?  Subjective weight loss-however no objective evidence; weight stable over the last few months.  Monitor for now  Thank you, Ms. Pender for allowing me to participate in the care of your pleasant patient. Please do not hesitate to contact me with questions or concerns in the interim.  # DISPOSITION: # labs today-  # US abdomen ASAP # follow up in 2-3 weeks- MD; no labs- Dr.B

## 2023-08-11 ENCOUNTER — Ambulatory Visit
Admission: RE | Admit: 2023-08-11 | Discharge: 2023-08-11 | Disposition: A | Discharge status: Home / Self Care | Payer: Medicare Other | Source: Ambulatory Visit | Attending: Internal Medicine | Admitting: Internal Medicine

## 2023-08-11 DIAGNOSIS — D696 Thrombocytopenia, unspecified: Secondary | ICD-10-CM | POA: Diagnosis present

## 2023-08-31 ENCOUNTER — Inpatient Hospital Stay: Payer: Medicare Other | Attending: Internal Medicine | Admitting: Internal Medicine

## 2023-08-31 ENCOUNTER — Encounter: Payer: Self-pay | Admitting: Internal Medicine

## 2023-08-31 VITALS — BP 142/82 | HR 62 | Temp 97.8°F | Resp 18 | Wt 221.0 lb

## 2023-08-31 DIAGNOSIS — D696 Thrombocytopenia, unspecified: Secondary | ICD-10-CM | POA: Insufficient documentation

## 2023-08-31 NOTE — Progress Notes (Signed)
About 2 weeks ago he noticed his fingers starting tingle and go numb, especially when he is sleeping at night. Patient had a [place] on 08/11/2023.

## 2023-08-31 NOTE — Assessment & Plan Note (Addendum)
#   Thrombocytopenia [since 2018]: Mild/moderate; mild to moderate Neutropenia [ANC 1200]. Hb is normal.  Hepatitis workup HIV negative.  # November 2024-hemoglobin normal-platelets 114; ANC 0.9-slow trend lower than baseline but overall stable/asymptomatic.  LDH 250.  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  However discussed regarding a bone marrow biopsy for further evaluation.  Discussed the option of continued surveillance repeat labs in 2 months versus bone marrow biopsy now.  Per patient preference/I think it is reasonable to wait repeat the blood work in 2  months at this time.  At this time I would recommend further work-up including-CBC CMP LDH; review of peripheral smear; HIV/hepatitis work-up.  Ultrasound abdomen- NOV 2024- Unremarkable ultrasound of the abdomen. No sonographic evidence of cirrhosis or splenomegaly-   # HTN-  well controlled.   # ? Subjective weight loss-however no objective evidence; weight stable over the last few months.   I spoke at length with the patient's wife, Sedalia Muta- regarding the patient's clinical status/plan of care.  Family agreement.    # DISPOSITION: # follow up in 2 month MD; labs- cbc; cmp; MM panel; K/l light chains; CRP; LDH-Dr.B

## 2023-08-31 NOTE — Progress Notes (Signed)
[facility] CONSULT NOTE  Patient Care Team: Berniece Salines, FNP as PCP - General (Nurse Practitioner) Antonieta Iba, MD as Consulting Physician (Cardiology) Riki Altes, MD as Consulting Physician (Urology) Juanell Fairly, MD as Consulting Physician (Orthopedic Surgery) Earna Coder, MD as Consulting Physician (Internal Medicine)  CHIEF COMPLAINTS/PURPOSE OF CONSULTATION: Thrombocytopenia  HISTORY OF PRESENTING ILLNESS: Patient ambulating-independently.  Alone.  [name] 82 y.o.  male history of chronic thrombocytopenia indicated reviewed results of his workup/labs..  Patient denies any easy bruising or bleeding.  Denies any frequent infections.  Admits to possible weight loss-subjective feeling of weight loss.  Review of Systems  Constitutional:  Positive for weight loss. Negative for chills, diaphoresis, fever and malaise/fatigue.  HENT:  Negative for nosebleeds and sore throat.   Eyes:  Negative for double vision.  Respiratory:  Negative for cough, hemoptysis, sputum production, shortness of breath and wheezing.   Cardiovascular:  Negative for chest pain, palpitations, orthopnea and leg swelling.  Gastrointestinal:  Negative for abdominal pain, blood in stool, constipation, diarrhea, heartburn, melena, nausea and vomiting.  Genitourinary:  Negative for dysuria, frequency and urgency.  Musculoskeletal:  Positive for back pain. Negative for joint pain.  Skin: Negative.  Negative for itching and rash.  Neurological:  Negative for dizziness, tingling, focal weakness, weakness and headaches.  Endo/Heme/Allergies:  Does not bruise/bleed easily.  Psychiatric/Behavioral:  Negative for depression. The patient is not nervous/anxious and does not have insomnia.      MEDICAL HISTORY:  Past Medical History:  Diagnosis Date   Arthritis    Diastolic dysfunction 06/17/2018   Grade 1, echo Sept 2019   GERD (gastroesophageal reflux disease)    Gout     HBP (high blood pressure)    Heart murmur    Hyperlipidemia    Left atrial dilation 06/17/2018   Echo Sept 2019   Mitral regurgitation 06/17/2018   Echo Sept 2019   Pulmonary regurgitation 06/17/2018   Echo Sept 2019   Tremors of nervous system     SURGICAL HISTORY: Past Surgical History:  Procedure Laterality Date   ANAL FISSURECTOMY     COLONOSCOPY WITH PROPOFOL N/A 08/25/2018   Procedure: COLONOSCOPY WITH PROPOFOL;  Surgeon: Wyline Mood, MD;  Location: [facility] ENDOSCOPY;  Service: Gastroenterology;  Laterality: N/A;   HYDROCELE EXCISION Left 03/01/2018   Procedure: HYDROCELECTOMY ADULT;  Surgeon: Riki Altes, MD;  Location: [facility] ORS;  Service: Urology;  Laterality: Left;    SOCIAL HISTORY: Social History   Socioeconomic History   Marital status: Married    Spouse name: Dianne   Number of children: 1   Years of education: Not on file   Highest education level: Master's degree (e.g., MA, MS, MEng, MEd, MSW, MBA)  Occupational History   Occupation: Retired  Tobacco Use   Smoking status: Never   Smokeless tobacco: Never   Tobacco comments:    smoking cessation materials not required  Vaping Use   Vaping status: Never Used  Substance and Sexual Activity   Alcohol use: No    Alcohol/week: 0.0 standard drinks of alcohol   Drug use: No   Sexual activity: Yes    Partners: Female  Other Topics Concern   Not on file  Social History Narrative   Not on file   Social Determinants of Health   Financial Resource Strain: Low Risk  (02/11/2023)   Overall Financial Resource Strain (CARDIA)    Difficulty of Paying Living Expenses: Not hard at all  Food  Insecurity: No Food Insecurity (02/11/2023)   Hunger Vital Sign    Worried About Running Out of Food in the Last Year: Never true    Ran Out of Food in the Last Year: Never true  Transportation Needs: No Transportation Needs (02/11/2023)   PRAPARE - Administrator, Civil Service (Medical): No    Lack of  Transportation (Non-Medical): No  Physical Activity: Inactive (02/11/2023)   Exercise Vital Sign    Days of Exercise per Week: 0 days    Minutes of Exercise per Session: 0 min  Stress: No Stress Concern Present (02/11/2023)   [employer] of Occupational Health - Occupational Stress Questionnaire    Feeling of Stress : Not at all  Social Connections: Socially Integrated (02/11/2023)   Social Connection and Isolation Panel [NHANES]    Frequency of Communication with Friends and Family: More than three times a week    Frequency of Social Gatherings with Friends and Family: Not on file    Attends Religious Services: More than 4 times per year    Active Member of [employer] or Organizations: Yes    Attends Banker Meetings: More than 4 times per year    Marital Status: Married  Catering manager Violence: Not At Risk (02/11/2023)   Humiliation, Afraid, Rape, and Kick questionnaire    Fear of Current or Ex-Partner: No    Emotionally Abused: No    Physically Abused: No    Sexually Abused: No    FAMILY HISTORY: Family History  Problem Relation Age of Onset   Hypertension Mother    Dementia Mother    Angina Mother    Diabetes Brother    Hypertension Brother    Blindness Maternal Grandmother     ALLERGIES:  is allergic to celebrex [celecoxib] and penicillins.  MEDICATIONS:  Current Outpatient Medications  Medication Sig Dispense Refill   acetaminophen (TYLENOL) 650 MG CR tablet Take 650 mg by mouth every 8 (eight) hours as needed for pain.     amLODipine (NORVASC) 10 MG tablet Take 1 tablet (10 mg total) by mouth daily. 90 tablet 3   atorvastatin (LIPITOR) 40 MG tablet TAKE 1 TABLET AT BEDTIME 90 tablet 3   gabapentin (NEURONTIN) 300 MG capsule TAKE 1 CAPSULE THREE TIMES A DAY 270 capsule 1   propranolol (INDERAL) 10 MG tablet TAKE 1 TABLET THREE TIMES A DAY 270 tablet 3   telmisartan (MICARDIS) 80 MG tablet Take 1 tablet (80 mg total) by mouth daily. 90 tablet 3   No  current facility-administered medications for this visit.    PHYSICAL EXAMINATION:  Vitals:   08/31/23 1446  BP: (!) 142/82  Pulse: 62  Resp: 18  Temp: 97.8 F (36.6 C)  SpO2: 100%    Filed Weights   08/31/23 1446  Weight: 221 lb (100.2 kg)     Physical Exam Vitals and nursing note reviewed.  HENT:     Head: Normocephalic and atraumatic.     Mouth/Throat:     Pharynx: Oropharynx is clear.  Eyes:     Extraocular Movements: Extraocular movements intact.     Pupils: Pupils are equal, round, and reactive to light.  Cardiovascular:     Rate and Rhythm: Normal rate and regular rhythm.  Pulmonary:     Comments: Decreased breath sounds bilaterally.  Abdominal:     Palpations: Abdomen is soft.  Musculoskeletal:        General: Normal range of motion.     Cervical back: Normal  range of motion.  Skin:    General: Skin is warm.  Neurological:     General: No focal deficit present.     Mental Status: He is alert and oriented to person, place, and time.  Psychiatric:        Behavior: Behavior normal.        Judgment: Judgment normal.     LABORATORY DATA:  I have reviewed the data as listed Lab Results  Component Value Date   WBC 2.7 (L) 08/09/2023   HGB 14.3 08/09/2023   HCT 42.5 08/09/2023   MCV 85.5 08/09/2023   PLT 114 (L) 08/09/2023   Recent Labs    05/21/23 1459 07/29/23 1355 08/09/23 1138  NA 138 139 137  K 4.2 3.9 4.2  CL 105 106 103  CO2 24 26 25   GLUCOSE 86 79 104*  BUN 11 14 11   CREATININE 1.09 1.00 1.01  CALCIUM 9.4 9.0 9.3  GFRNONAA  --   --  >60  PROT 8.0 7.5 8.6*  ALBUMIN  --   --  4.6  AST 34 29 35  ALT 41 27 36  ALKPHOS  --   --  67  BILITOT 0.8 0.6 1.3*    RADIOGRAPHIC STUDIES: I have personally reviewed the radiological images as listed and agreed with the findings in the report. US Abdomen Complete  Result Date: 08/13/2023 : PROCEDURE: ULTRASOUND ABDOMEN COMPLETE HISTORY: Patient is a 82 y/o Male with Thrombocytopenia- rule  out cirrhosis/splenomegaly COMPARISON: None available. TECHNIQUE: Two-dimensional grayscale, color and spectral Doppler ultrasound of the abdomen was performed. FINDINGS: The pancreas is obscured due to overlying bowel gas. The liver demonstrates normal echotexture without intrahepatic biliary dilatation. No masses are visualized. The main portal vein demonstrates normal hepatopedal flow. The gallbladder demonstrates normal anechoic echotexture without pericholecystic fluid or wall thickening. There are no gallstones. The common bile duct measures 0.3 cm. Negative sonographic Murphy's sign. The right kidney measures 10.5 cm in length. Renal cortical echotexture is normal. There is no hydronephrosis. There are no stones. There are no cysts. The left kidney measures 10.6 cm in length. Renal cortical echotexture is normal. There is no hydronephrosis. There are no stones. There are no cysts. The spleen measures 7.8 cm in length and demonstrates normal echotexture. There is no evidence of aneurysm within the visualized segments of the abdominal aorta. The visualized segments of the IVC are unremarkable. IMPRESSION: 1. Unremarkable ultrasound of the abdomen. No sonographic evidence of cirrhosis or splenomegaly. Thank you for allowing [place] to assist in the care of this patient. Electronically Signed   By: Lestine Box M.D.   On: 08/13/2023 08:37    Thrombocytopenia (HCC) # Thrombocytopenia [since 2018]: Mild/moderate; mild to moderate Neutropenia [ANC 1200]. Hb is normal.  Hepatitis workup HIV negative.  # November 2024-hemoglobin normal-platelets 114; ANC 0.9-slow trend lower than baseline but overall stable/asymptomatic.  LDH 250.  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  However discussed regarding a bone marrow biopsy for further evaluation.  Discussed the option of continued surveillance repeat labs in 2 months versus bone marrow biopsy now.  Per patient preference/I think it is  reasonable to wait repeat the blood work in 2  months at this time.  At this time I would recommend further work-up including-CBC CMP LDH; review of peripheral smear; HIV/hepatitis work-up.  Ultrasound abdomen- NOV 2024- Unremarkable ultrasound of the abdomen. No sonographic evidence of cirrhosis or splenomegaly-   # HTN-  well controlled.   # ?  Subjective weight loss-however no objective evidence; weight stable over the last few months.   I spoke at length with the patient's wife, Sedalia Muta- regarding the patient's clinical status/plan of care.  Family agreement.    # DISPOSITION: # follow up in 2 month MD; labs- cbc; cmp; MM panel; K/l light chains; CRP; LDH-Dr.B   All questions were answered. The patient knows to call the clinic with any problems, questions or concerns.     Earna Coder, MD 08/31/2023 3:27 PM

## 2023-10-01 ENCOUNTER — Telehealth: Payer: Self-pay

## 2023-10-01 NOTE — Telephone Encounter (Signed)
 Received a refill request from [facility] for Telmisartan 80 mg tablets. A 1 year prescription was sent to [facility] on 05/04/2023. Called and left the patient a message requesting a call back to verify which pharmacy he uses.

## 2023-10-06 ENCOUNTER — Encounter: Payer: Self-pay | Admitting: Cardiovascular Disease

## 2023-10-06 DIAGNOSIS — I10 Essential (primary) hypertension: Secondary | ICD-10-CM

## 2023-10-07 ENCOUNTER — Telehealth: Payer: Self-pay | Admitting: Nurse Practitioner

## 2023-10-07 ENCOUNTER — Encounter: Payer: Self-pay | Admitting: Nurse Practitioner

## 2023-10-07 DIAGNOSIS — R251 Tremor, unspecified: Secondary | ICD-10-CM

## 2023-10-07 DIAGNOSIS — I10 Essential (primary) hypertension: Secondary | ICD-10-CM

## 2023-10-07 DIAGNOSIS — E782 Mixed hyperlipidemia: Secondary | ICD-10-CM

## 2023-10-07 MED ORDER — TELMISARTAN 80 MG PO TABS: 80.0000 mg | ORAL_TABLET | Freq: Every day | ORAL | 3 refills | Status: DC

## 2023-10-07 MED ORDER — PROPRANOLOL HCL 10 MG PO TABS: 10.0000 mg | ORAL_TABLET | Freq: Three times a day (TID) | ORAL | 3 refills | Status: AC

## 2023-10-07 MED ORDER — ATORVASTATIN CALCIUM 40 MG PO TABS: 40.0000 mg | ORAL_TABLET | Freq: Every day | ORAL | 3 refills | Status: AC

## 2023-10-07 NOTE — Telephone Encounter (Signed)
 Marland Kitchen

## 2023-10-20 NOTE — Telephone Encounter (Signed)
Pleases call home phone number.

## 2023-10-20 NOTE — Telephone Encounter (Signed)
Patient is having some issues with his muscle mass. He is also losing weight at a rapid pace. Would like to be seen before net scheduled appt on 2/10. Pt wife is very concerned for his health. Please contact patient to discuss.

## 2023-10-22 ENCOUNTER — Other Ambulatory Visit: Payer: Medicare Other

## 2023-10-22 ENCOUNTER — Ambulatory Visit: Payer: Medicare Other | Admitting: Nurse Practitioner

## 2023-10-25 ENCOUNTER — Telehealth: Payer: Self-pay | Admitting: *Deleted

## 2023-10-25 ENCOUNTER — Encounter: Payer: Self-pay | Admitting: Nurse Practitioner

## 2023-10-25 ENCOUNTER — Inpatient Hospital Stay: Payer: Medicare Other | Attending: Internal Medicine

## 2023-10-25 ENCOUNTER — Inpatient Hospital Stay: Payer: Medicare Other | Admitting: Nurse Practitioner

## 2023-10-25 VITALS — BP 157/75 | HR 58 | Temp 97.2°F | Ht 72.0 in | Wt 221.0 lb

## 2023-10-25 DIAGNOSIS — M6250 Muscle wasting and atrophy, not elsewhere classified, unspecified site: Secondary | ICD-10-CM | POA: Insufficient documentation

## 2023-10-25 DIAGNOSIS — D696 Thrombocytopenia, unspecified: Secondary | ICD-10-CM | POA: Diagnosis present

## 2023-10-25 LAB — CBC WITH DIFFERENTIAL (CANCER CENTER ONLY)
Abs Immature Granulocytes: 0 10*3/uL (ref 0.00–0.07)
Basophils Absolute: 0 10*3/uL (ref 0.0–0.1)
Basophils Relative: 0 %
Eosinophils Absolute: 0.1 10*3/uL (ref 0.0–0.5)
Eosinophils Relative: 3 %
HCT: 41.7 % (ref 39.0–52.0)
Hemoglobin: 13.8 g/dL (ref 13.0–17.0)
Immature Granulocytes: 0 %
Lymphocytes Relative: 51 %
Lymphs Abs: 1.3 10*3/uL (ref 0.7–4.0)
MCH: 28 pg (ref 26.0–34.0)
MCHC: 33.1 g/dL (ref 30.0–36.0)
MCV: 84.8 fL (ref 80.0–100.0)
Monocytes Absolute: 0.3 10*3/uL (ref 0.1–1.0)
Monocytes Relative: 12 %
Neutro Abs: 0.9 10*3/uL — ABNORMAL LOW (ref 1.7–7.7)
Neutrophils Relative %: 34 %
Platelet Count: 124 10*3/uL — ABNORMAL LOW (ref 150–400)
RBC: 4.92 MIL/uL (ref 4.22–5.81)
RDW: 14 % (ref 11.5–15.5)
WBC Count: 2.5 10*3/uL — ABNORMAL LOW (ref 4.0–10.5)
nRBC: 0 % (ref 0.0–0.2)

## 2023-10-25 LAB — CMP (CANCER CENTER ONLY)
ALT: 38 U/L (ref 0–44)
AST: 36 U/L (ref 15–41)
Albumin: 4.2 g/dL (ref 3.5–5.0)
Alkaline Phosphatase: 57 U/L (ref 38–126)
Anion gap: 4 — ABNORMAL LOW (ref 5–15)
BUN: 10 mg/dL (ref 8–23)
CO2: 27 mmol/L (ref 22–32)
Calcium: 9.3 mg/dL (ref 8.9–10.3)
Chloride: 108 mmol/L (ref 98–111)
Creatinine: 1.06 mg/dL (ref 0.61–1.24)
GFR, Estimated: >60 mL/min (ref >60)
Glucose, Bld: 101 mg/dL — ABNORMAL HIGH (ref 70–99)
Potassium: 4 mmol/L (ref 3.5–5.1)
Sodium: 139 mmol/L (ref 135–145)
Total Bilirubin: 1.1 mg/dL (ref 0.0–1.2)
Total Protein: 8.2 g/dL — ABNORMAL HIGH (ref 6.5–8.1)

## 2023-10-25 LAB — C-REACTIVE PROTEIN: CRP: <0.5 mg/dL (ref <1.0)

## 2023-10-25 LAB — LACTATE DEHYDROGENASE: LDH: 237 U/L — ABNORMAL HIGH (ref 98–192)

## 2023-10-25 NOTE — Telephone Encounter (Signed)
The wife called back saying that maybe we should schedule it out of the appointment for maybe 2 months because the in the beginning she was saying 30 days.  Also she had wanted to see if warranted add a PSA onto the labs he had today.  And I told her that it is the end of the day and everybody is going home and I do not even know if that would be the right tube and since everybody is done gone that will not be able to do.   She is a day with this and she says if her husband does not want the March 5 and he will call back and let us know

## 2023-10-25 NOTE — Addendum Note (Signed)
Addended by: Darrold Span A on: 10/25/2023 04:20 PM   Modules accepted: Orders

## 2023-10-25 NOTE — Progress Notes (Signed)
[facility] CONSULT NOTE  Patient Care Team: Berniece Salines, FNP as PCP - General (Nurse Practitioner) Antonieta Iba, MD as Consulting Physician (Cardiology) Riki Altes, MD as Consulting Physician (Urology) Juanell Fairly, MD as Consulting Physician (Orthopedic Surgery) Earna Coder, MD as Consulting Physician (Internal Medicine)  CHIEF COMPLAINTS/PURPOSE OF CONSULTATION: Thrombocytopenia  HISTORY OF PRESENTING ILLNESS: Patient ambulating independently. Alone.  Jesus Dillon 83 y.o. male with history of chronic thrombocytopenia who returns to clinic for reevaluation and follow up. He admits to possible subjective weight loss- feels watch and rings are looser. Pants fit the same. Legs appear thinner. No loss of appetite or disinterest in food. Denies easy bruising, bleeding. Denies interval infections. No fevers, chills, night sweats. No new lumps or bumps.   Review of Systems  Constitutional:  Positive for weight loss. Negative for chills, diaphoresis, fever and malaise/fatigue.  HENT:  Negative for nosebleeds and sore throat.   Eyes:  Negative for double vision.  Respiratory:  Negative for cough, hemoptysis, sputum production, shortness of breath and wheezing.   Cardiovascular:  Negative for chest pain, palpitations, orthopnea and leg swelling.  Gastrointestinal:  Negative for abdominal pain, blood in stool, constipation, diarrhea, heartburn, melena, nausea and vomiting.  Genitourinary:  Negative for dysuria, flank pain, frequency, hematuria and urgency.  Musculoskeletal:  Positive for joint pain. Negative for back pain.  Skin: Negative.  Negative for itching and rash.  Neurological:  Negative for dizziness, tingling, focal weakness, weakness and headaches.  Endo/Heme/Allergies:  Does not bruise/bleed easily.  Psychiatric/Behavioral:  Negative for depression. The patient is not nervous/anxious and does not have insomnia.      MEDICAL HISTORY:  Past  Medical History:  Diagnosis Date   Arthritis    Diastolic dysfunction 06/17/2018   Grade 1, echo Sept 2019   GERD (gastroesophageal reflux disease)    Gout    HBP (high blood pressure)    Heart murmur    Hyperlipidemia    Left atrial dilation 06/17/2018   Echo Sept 2019   Mitral regurgitation 06/17/2018   Echo Sept 2019   Pulmonary regurgitation 06/17/2018   Echo Sept 2019   Tremors of nervous system     SURGICAL HISTORY: Past Surgical History:  Procedure Laterality Date   ANAL FISSURECTOMY     COLONOSCOPY WITH PROPOFOL N/A 08/25/2018   Procedure: COLONOSCOPY WITH PROPOFOL;  Surgeon: Wyline Mood, MD;  Location: [facility] ENDOSCOPY;  Service: Gastroenterology;  Laterality: N/A;   HYDROCELE EXCISION Left 03/01/2018   Procedure: HYDROCELECTOMY ADULT;  Surgeon: Riki Altes, MD;  Location: [facility] ORS;  Service: Urology;  Laterality: Left;    SOCIAL HISTORY: Social History   Socioeconomic History   Marital status: Married    Spouse name: Dianne   Number of children: 1   Years of education: Not on file   Highest education level: Master's degree (e.g., MA, MS, MEng, MEd, MSW, MBA)  Occupational History   Occupation: Retired  Tobacco Use   Smoking status: Never   Smokeless tobacco: Never   Tobacco comments:    smoking cessation materials not required  Vaping Use   Vaping status: Never Used  Substance and Sexual Activity   Alcohol use: No    Alcohol/week: 0.0 standard drinks of alcohol   Drug use: No   Sexual activity: Yes    Partners: Female  Other Topics Concern   Not on file  Social History Narrative   Not on file   Social Drivers of Health  Financial Resource Strain: Low Risk  (02/11/2023)   Overall Financial Resource Strain (CARDIA)    Difficulty of Paying Living Expenses: Not hard at all  Food Insecurity: No Food Insecurity (02/11/2023)   Hunger Vital Sign    Worried About Running Out of Food in the Last Year: Never true    Ran Out of Food in the Last Year: Never  true  Transportation Needs: No Transportation Needs (02/11/2023)   PRAPARE - Administrator, Civil Service (Medical): No    Lack of Transportation (Non-Medical): No  Physical Activity: Inactive (02/11/2023)   Exercise Vital Sign    Days of Exercise per Week: 0 days    Minutes of Exercise per Session: 0 min  Stress: No Stress Concern Present (02/11/2023)   [employer] of Occupational Health - Occupational Stress Questionnaire    Feeling of Stress : Not at all  Social Connections: Socially Integrated (02/11/2023)   Social Connection and Isolation Panel [NHANES]    Frequency of Communication with Friends and Family: More than three times a week    Frequency of Social Gatherings with Friends and Family: Not on file    Attends Religious Services: More than 4 times per year    Active Member of [employer] or Organizations: Yes    Attends Banker Meetings: More than 4 times per year    Marital Status: Married  Catering manager Violence: Not At Risk (02/11/2023)   Humiliation, Afraid, Rape, and Kick questionnaire    Fear of Current or Ex-Partner: No    Emotionally Abused: No    Physically Abused: No    Sexually Abused: No    FAMILY HISTORY: Family History  Problem Relation Age of Onset   Hypertension Mother    Dementia Mother    Angina Mother    Diabetes Brother    Hypertension Brother    Blindness Maternal Grandmother     ALLERGIES:  is allergic to celebrex [celecoxib] and penicillins.  MEDICATIONS:  Current Outpatient Medications  Medication Sig Dispense Refill   acetaminophen (TYLENOL) 650 MG CR tablet Take 650 mg by mouth every 8 (eight) hours as needed for pain.     amLODipine (NORVASC) 10 MG tablet Take 1 tablet (10 mg total) by mouth daily. 90 tablet 3   atorvastatin (LIPITOR) 40 MG tablet Take 1 tablet (40 mg total) by mouth at bedtime. 90 tablet 3   gabapentin (NEURONTIN) 300 MG capsule TAKE 1 CAPSULE THREE TIMES A DAY 270 capsule 1   propranolol  (INDERAL) 10 MG tablet Take 1 tablet (10 mg total) by mouth 3 (three) times daily. 270 tablet 3   telmisartan (MICARDIS) 80 MG tablet Take 1 tablet (80 mg total) by mouth daily. 90 tablet 3   No current facility-administered medications for this visit.    PHYSICAL EXAMINATION: Vitals:   10/25/23 1433  BP: (!) 157/75  Pulse: (!) 58  Temp: (!) 97.2 F (36.2 C)  SpO2: 100%   Filed Weights   10/25/23 1433  Weight: 221 lb (100.2 kg)    Physical Exam Vitals reviewed.  Constitutional:      Appearance: He is not ill-appearing.  HENT:     Head: Normocephalic and atraumatic.     Mouth/Throat:     Pharynx: Oropharynx is clear.  Eyes:     Extraocular Movements: Extraocular movements intact.     Pupils: Pupils are equal, round, and reactive to light.  Cardiovascular:     Rate and Rhythm: Normal rate and regular  rhythm.  Pulmonary:     Effort: No respiratory distress.  Abdominal:     General: There is no distension.     Palpations: Abdomen is soft.  Musculoskeletal:        General: No deformity.  Skin:    General: Skin is warm.     Coloration: Skin is not pale.     Findings: No bruising or rash.  Neurological:     Mental Status: He is alert and oriented to person, place, and time.  Psychiatric:        Mood and Affect: Mood normal.        Behavior: Behavior normal.    LABORATORY DATA:  I have reviewed the data as listed Lab Results  Component Value Date   WBC 2.5 (L) 10/25/2023   HGB 13.8 10/25/2023   HCT 41.7 10/25/2023   MCV 84.8 10/25/2023   PLT 124 (L) 10/25/2023   Recent Labs    07/29/23 1355 08/09/23 1138 10/25/23 1412  NA 139 137 139  K 3.9 4.2 4.0  CL 106 103 108  CO2 26 25 27   GLUCOSE 79 104* 101*  BUN 14 11 10   CREATININE 1.00 1.01 1.06  CALCIUM 9.0 9.3 9.3  GFRNONAA  --  >60 >60  PROT 7.5 8.6* 8.2*  ALBUMIN  --  4.6 4.2  AST 29 35 36  ALT 27 36 38  ALKPHOS  --  67 57  BILITOT 0.6 1.3* 1.1    RADIOGRAPHIC STUDIES: I have personally  reviewed the radiological images as listed and agreed with the findings in the report. No results found.  Assessment & Plan:   # Thrombocytopenia [since 2018]: Mild/moderate; mild to moderate Neutropenia [ANC 1200]. Hb is normal.  Hepatitis workup HIV negative.   # November 2024- hemoglobin normal. platelets 114; ANC 0.9-slow trend lower than baseline but overall stable/asymptomatic.  LDH 250.  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 He has not yet required bone marrow biopsy. Ultrasound abdomen 07/2023- unremarkable- no evidence of cirrhosis or splenomegaly.   # Labs today are stable- plt 124. ANC 0.9. LDH 237. SPEP, Light chains, CRP pending.   # Weight loss- subjective. Weight 240 lbs in 2023. Past year has been stable in 220s. Per patient, loss of muscle mass. No changes in appetite or disinterest in food. Less exercise due to OA/knee. Discussed option of CT scans to evaluate but feel given stability of his weight, may be premature. Could consider adding protein supplement/shake.   # HTN-  well controlled.    I spoke at length with the patient's wife, Diane by phone regarding the patient's clinical status/plan of care. Wife's preference is to continue monthly monitoring of weight and blood work at cancer center. Recommend he see pcp to ensure all cancer screenings are up to date.     # DISPOSITION: # follow up in 1 month labs (cbc, cmp, ldh), Dr Donneta Romberg- [place]   No problem-specific Assessment & Plan notes found for this encounter.  All questions were answered. The patient knows to call the clinic with any problems, questions or concerns.  Alinda Dooms, NP 10/25/2023

## 2023-10-26 LAB — KAPPA/LAMBDA LIGHT CHAINS
Kappa free light chain: 10.9 mg/L (ref 3.3–19.4)
Kappa, lambda light chain ratio: 0.2 — ABNORMAL LOW (ref 0.26–1.65)
Lambda free light chains: 55.7 mg/L — ABNORMAL HIGH (ref 5.7–26.3)

## 2023-10-28 LAB — MULTIPLE MYELOMA PANEL, SERUM
Albumin SerPl Elph-Mcnc: 4 g/dL (ref 2.9–4.4)
Albumin/Glob SerPl: 1.1 (ref 0.7–1.7)
Alpha 1: 0.2 g/dL (ref 0.0–0.4)
Alpha2 Glob SerPl Elph-Mcnc: 0.5 g/dL (ref 0.4–1.0)
B-Globulin SerPl Elph-Mcnc: 1 g/dL (ref 0.7–1.3)
Gamma Glob SerPl Elph-Mcnc: 2.2 g/dL — ABNORMAL HIGH (ref 0.4–1.8)
Globulin, Total: 3.9 g/dL (ref 2.2–3.9)
IgA: 56 mg/dL — ABNORMAL LOW (ref 61–437)
IgG (Immunoglobin G), Serum: 2379 mg/dL — ABNORMAL HIGH (ref 603–1613)
IgM (Immunoglobulin M), Srm: 18 mg/dL (ref 15–143)
M Protein SerPl Elph-Mcnc: 1.7 g/dL — ABNORMAL HIGH
Total Protein ELP: 7.9 g/dL (ref 6.0–8.5)

## 2023-11-01 ENCOUNTER — Ambulatory Visit: Payer: Medicare Other | Admitting: Internal Medicine

## 2023-11-01 ENCOUNTER — Other Ambulatory Visit: Payer: Medicare Other

## 2023-11-03 ENCOUNTER — Telehealth: Payer: Self-pay | Admitting: Internal Medicine

## 2023-11-03 NOTE — Telephone Encounter (Signed)
I tried reaching the patient unavailable cannot leave a voicemail.  I called wife number-left a voicemail that we need to meet to discuss the results of his blood work.   Please make an appt this Friday or next week- MD: no labs- Dr.B

## 2023-11-03 NOTE — Telephone Encounter (Signed)
Called and left patient a voicemail with appointment details for 2/14.

## 2023-11-05 ENCOUNTER — Encounter: Payer: Self-pay | Admitting: Internal Medicine

## 2023-11-05 ENCOUNTER — Ambulatory Visit: Payer: Medicare Other | Admitting: Internal Medicine

## 2023-11-05 ENCOUNTER — Inpatient Hospital Stay: Payer: Medicare Other | Admitting: Internal Medicine

## 2023-11-05 VITALS — BP 140/75 | HR 60 | Temp 98.0°F | Resp 16 | Wt 225.0 lb

## 2023-11-05 DIAGNOSIS — D472 Monoclonal gammopathy: Secondary | ICD-10-CM

## 2023-11-05 DIAGNOSIS — D696 Thrombocytopenia, unspecified: Secondary | ICD-10-CM | POA: Diagnosis not present

## 2023-11-05 NOTE — Progress Notes (Signed)
Patient is worried about his BMI, he is eating well and still has a great appetite, it is he is getting much thinner, they want to know what can be causing it. He doesn't feel bad, he might have a little bit of pain from his arthritis. His WBC count is low and has been decreasing.

## 2023-11-05 NOTE — Assessment & Plan Note (Addendum)
#   Thrombocytopenia [since 2018]: Mild/moderate; mild to moderate Neutropenia [ANC 1200]. Hb is normal.  Hepatitis workup HIV negative.   # FEB 2025-hemoglobin normal-platelets  124; ANC 0.9-slow trend lower than baseline but overall stable/asymptomatic.  LDH 250.  Interestingly M protein-1.7 IgG lambda; kappa lambda light chain ratio abnormal lambda light chain 55.  Normal renal function calcium. NOV 2024- Unremarkable ultrasound of the abdomen. No sonographic evidence of cirrhosis or splenomegaly-   # Patient has chronic mild neuropathy; and also noted to have muscle wasting as per the right without any significant weight loss.   # Given the abnormal hematologic findings-and the ongoing muscle wasting-I think is reasonable to proceed with further workup including a bone marrow biopsy.  Understands that it is quite possible that the bone marrow biopsy might be unrevealing to the etiology of his muscle wasting.   # I discussed with the patient the bone marrow biopsy and aspiration indication and procedure at length.  Given significant discomfort involved-I would recommend under anesthesia/with radiology in the hospital. I discussed the potential complications include-bleeding/trauma and risk of infection; which are fortunately very rare.  Patient is in agreement. Patient will sign the consent prior to the procedure. Bone marrow biopsy/aspiration is ordered.   # Muscle wasting, without any muscle weakness-the etiology is unclear await above workup.   I spoke at length with the patient's wife, Sedalia Muta- regarding the patient's clinical status/plan of care.  Family agreement.  Recommend Dr. PCP regarding testosterone levels and also PSA as per the wife concerns.   # DISPOSITION: # Bone marrow ASAP # follow up in 1 week after Bone marrow Bx- - MD; labs- None- -Dr.B

## 2023-11-05 NOTE — Progress Notes (Signed)
[facility] CONSULT NOTE  Patient Care Team: Berniece Salines, FNP as PCP - General (Nurse Practitioner) Antonieta Iba, MD as Consulting Physician (Cardiology) Riki Altes, MD as Consulting Physician (Urology) Juanell Fairly, MD as Consulting Physician (Orthopedic Surgery) Earna Coder, MD as Consulting Physician (Internal Medicine)  CHIEF COMPLAINTS/PURPOSE OF CONSULTATION: Thrombocytopenia  HISTORY OF PRESENTING ILLNESS: Patient ambulating-independently.  Alone.  Jesus Dillon 83 y.o.  male history of chronic thrombocytopenia indicated reviewed results of his workup/labs.  Patient is worried about his BMI, he is eating well and still has a great appetite, it is he is getting much thinner, they want to know what can be causing it.   He doesn't feel bad, he might have a little bit of pain from his arthritis. His WBC count is low and has been decreasing.  Wife is concerned about muscle wasting. Denies any muscle weakness.   Review of Systems  Constitutional:  Positive for weight loss. Negative for chills, diaphoresis, fever and malaise/fatigue.  HENT:  Negative for nosebleeds and sore throat.   Eyes:  Negative for double vision.  Respiratory:  Negative for cough, hemoptysis, sputum production, shortness of breath and wheezing.   Cardiovascular:  Negative for chest pain, palpitations, orthopnea and leg swelling.  Gastrointestinal:  Negative for abdominal pain, blood in stool, constipation, diarrhea, heartburn, melena, nausea and vomiting.  Genitourinary:  Negative for dysuria, frequency and urgency.  Musculoskeletal:  Positive for back pain. Negative for joint pain.  Skin: Negative.  Negative for itching and rash.  Neurological:  Negative for dizziness, tingling, focal weakness, weakness and headaches.  Endo/Heme/Allergies:  Does not bruise/bleed easily.  Psychiatric/Behavioral:  Negative for depression. The patient is not nervous/anxious and does not  have insomnia.      MEDICAL HISTORY:  Past Medical History:  Diagnosis Date   Arthritis    Diastolic dysfunction 06/17/2018   Grade 1, echo Sept 2019   GERD (gastroesophageal reflux disease)    Gout    HBP (high blood pressure)    Heart murmur    Hyperlipidemia    Left atrial dilation 06/17/2018   Echo Sept 2019   Mitral regurgitation 06/17/2018   Echo Sept 2019   Pulmonary regurgitation 06/17/2018   Echo Sept 2019   Tremors of nervous system     SURGICAL HISTORY: Past Surgical History:  Procedure Laterality Date   ANAL FISSURECTOMY     COLONOSCOPY WITH PROPOFOL N/A 08/25/2018   Procedure: COLONOSCOPY WITH PROPOFOL;  Surgeon: Wyline Mood, MD;  Location: [facility] ENDOSCOPY;  Service: Gastroenterology;  Laterality: N/A;   HYDROCELE EXCISION Left 03/01/2018   Procedure: HYDROCELECTOMY ADULT;  Surgeon: Riki Altes, MD;  Location: [facility] ORS;  Service: Urology;  Laterality: Left;    SOCIAL HISTORY: Social History   Socioeconomic History   Marital status: Married    Spouse name: Dianne   Number of children: 1   Years of education: Not on file   Highest education level: Master's degree (e.g., MA, MS, MEng, MEd, MSW, MBA)  Occupational History   Occupation: Retired  Tobacco Use   Smoking status: Never   Smokeless tobacco: Never   Tobacco comments:    smoking cessation materials not required  Vaping Use   Vaping status: Never Used  Substance and Sexual Activity   Alcohol use: No    Alcohol/week: 0.0 standard drinks of alcohol   Drug use: No   Sexual activity: Yes    Partners: Female  Other Topics Concern  Not on file  Social History Narrative   Not on file   Social Drivers of Health   Financial Resource Strain: Low Risk  (02/11/2023)   Overall Financial Resource Strain (CARDIA)    Difficulty of Paying Living Expenses: Not hard at all  Food Insecurity: No Food Insecurity (02/11/2023)   Hunger Vital Sign    Worried About Running Out of Food in the Last Year: Never  true    Ran Out of Food in the Last Year: Never true  Transportation Needs: No Transportation Needs (02/11/2023)   PRAPARE - Administrator, Civil Service (Medical): No    Lack of Transportation (Non-Medical): No  Physical Activity: Inactive (02/11/2023)   Exercise Vital Sign    Days of Exercise per Week: 0 days    Minutes of Exercise per Session: 0 min  Stress: No Stress Concern Present (02/11/2023)   [employer] of Occupational Health - Occupational Stress Questionnaire    Feeling of Stress : Not at all  Social Connections: Socially Integrated (02/11/2023)   Social Connection and Isolation Panel [NHANES]    Frequency of Communication with Friends and Family: More than three times a week    Frequency of Social Gatherings with Friends and Family: Not on file    Attends Religious Services: More than 4 times per year    Active Member of [employer] or Organizations: Yes    Attends Banker Meetings: More than 4 times per year    Marital Status: Married  Catering manager Violence: Not At Risk (02/11/2023)   Humiliation, Afraid, Rape, and Kick questionnaire    Fear of Current or Ex-Partner: No    Emotionally Abused: No    Physically Abused: No    Sexually Abused: No    FAMILY HISTORY: Family History  Problem Relation Age of Onset   Hypertension Mother    Dementia Mother    Angina Mother    Diabetes Brother    Hypertension Brother    Blindness Maternal Grandmother     ALLERGIES:  is allergic to celebrex [celecoxib] and penicillins.  MEDICATIONS:  Current Outpatient Medications  Medication Sig Dispense Refill   acetaminophen (TYLENOL) 650 MG CR tablet Take 650 mg by mouth every 8 (eight) hours as needed for pain.     amLODipine (NORVASC) 10 MG tablet Take 1 tablet (10 mg total) by mouth daily. 90 tablet 3   atorvastatin (LIPITOR) 40 MG tablet Take 1 tablet (40 mg total) by mouth at bedtime. 90 tablet 3   gabapentin (NEURONTIN) 300 MG capsule TAKE 1 CAPSULE  THREE TIMES A DAY 270 capsule 1   propranolol (INDERAL) 10 MG tablet Take 1 tablet (10 mg total) by mouth 3 (three) times daily. 270 tablet 3   telmisartan (MICARDIS) 80 MG tablet Take 1 tablet (80 mg total) by mouth daily. 90 tablet 3   No current facility-administered medications for this visit.    PHYSICAL EXAMINATION:  Vitals:   11/05/23 0954  BP: (!) 140/75  Pulse: 60  Resp: 16  Temp: 98 F (36.7 C)  SpO2: 100%    Filed Weights   11/05/23 0954  Weight: 225 lb (102.1 kg)     Physical Exam Vitals and nursing note reviewed.  HENT:     Head: Normocephalic and atraumatic.     Mouth/Throat:     Pharynx: Oropharynx is clear.  Eyes:     Extraocular Movements: Extraocular movements intact.     Pupils: Pupils are equal, round, and reactive  to light.  Cardiovascular:     Rate and Rhythm: Normal rate and regular rhythm.  Pulmonary:     Comments: Decreased breath sounds bilaterally.  Abdominal:     Palpations: Abdomen is soft.  Musculoskeletal:        General: Normal range of motion.     Cervical back: Normal range of motion.  Skin:    General: Skin is warm.  Neurological:     General: No focal deficit present.     Mental Status: He is alert and oriented to person, place, and time.  Psychiatric:        Behavior: Behavior normal.        Judgment: Judgment normal.     LABORATORY DATA:  I have reviewed the data as listed Lab Results  Component Value Date   WBC 2.5 (L) 10/25/2023   HGB 13.8 10/25/2023   HCT 41.7 10/25/2023   MCV 84.8 10/25/2023   PLT 124 (L) 10/25/2023   Recent Labs    07/29/23 1355 08/09/23 1138 10/25/23 1412  NA 139 137 139  K 3.9 4.2 4.0  CL 106 103 108  CO2 26 25 27   GLUCOSE 79 104* 101*  BUN 14 11 10   CREATININE 1.00 1.01 1.06  CALCIUM 9.0 9.3 9.3  GFRNONAA  --  >60 >60  PROT 7.5 8.6* 8.2*  ALBUMIN  --  4.6 4.2  AST 29 35 36  ALT 27 36 38  ALKPHOS  --  67 57  BILITOT 0.6 1.3* 1.1    RADIOGRAPHIC STUDIES: I have  personally reviewed the radiological images as listed and agreed with the findings in the report. No results found.  Thrombocytopenia (HCC) # Thrombocytopenia [since 2018]: Mild/moderate; mild to moderate Neutropenia [ANC 1200]. Hb is normal.  Hepatitis workup HIV negative.   # FEB 2025-hemoglobin normal-platelets  124; ANC 0.9-slow trend lower than baseline but overall stable/asymptomatic.  LDH 250.  Interestingly M protein-1.7 IgG lambda; kappa lambda light chain ratio abnormal lambda light chain 55.  Normal renal function calcium. NOV 2024- Unremarkable ultrasound of the abdomen. No sonographic evidence of cirrhosis or splenomegaly-   # Patient has chronic mild neuropathy; and also noted to have muscle wasting as per the right without any significant weight loss.   # Given the abnormal hematologic findings-and the ongoing muscle wasting-I think is reasonable to proceed with further workup including a bone marrow biopsy.  Understands that it is quite possible that the bone marrow biopsy might be unrevealing to the etiology of his muscle wasting.   # I discussed with the patient the bone marrow biopsy and aspiration indication and procedure at length.  Given significant discomfort involved-I would recommend under anesthesia/with radiology in the hospital. I discussed the potential complications include-bleeding/trauma and risk of infection; which are fortunately very rare.  Patient is in agreement. Patient will sign the consent prior to the procedure. Bone marrow biopsy/aspiration is ordered.   # Muscle wasting, without any muscle weakness-the etiology is unclear await above workup.   I spoke at length with the patient's wife, Sedalia Muta- regarding the patient's clinical status/plan of care.  Family agreement.  Recommend Dr. PCP regarding testosterone levels and also PSA as per the wife concerns.   # DISPOSITION: # Bone marrow ASAP # follow up in 1 week after Bone marrow Bx- - MD; labs- None-  -Dr.B    All questions were answered. The patient knows to call the clinic with any problems, questions or concerns.     Jesus Dillon  Leeanne Deed, MD 11/05/2023 12:10 PM

## 2023-11-15 ENCOUNTER — Telehealth: Payer: Self-pay | Admitting: Internal Medicine

## 2023-11-15 NOTE — Telephone Encounter (Signed)
 Pt wife called and stated that pt wanted to wait for his biopsy and follow up with Dr. Leonard Schwartz. He did not specify when or why he wanted move it. They requested a phone call to verify that the biopsy was cancelled.

## 2023-11-18 NOTE — Progress Notes (Unsigned)
 There were no vitals taken for this visit.   Subjective:    [name], male    DOB: December 22, 1940, 83 y.o.   MRN: [id number]  HPI: [name] AGE is a 83 y.o. male  No chief complaint on file.   Discussed the use of AI scribe software for clinical note transcription with the patient, who gave verbal consent to proceed.  History of Present Illness           07/29/2023    1:38 PM 05/21/2023    1:52 PM 02/11/2023    3:48 PM  Depression screen PHQ 2/9  Decreased Interest 0 0 0  Down, Depressed, Hopeless 0 0 0  PHQ - 2 Score 0 0 0    Relevant past medical, surgical, family and social history reviewed and updated as indicated. Interim medical history since our last visit reviewed. Allergies and medications reviewed and updated.  Review of Systems  Per HPI unless specifically indicated above     Objective:    There were no vitals taken for this visit.  {Vitals History (Optional):23777} Wt Readings from Last 3 Encounters:  11/05/23 225 lb (102.1 kg)  10/25/23 221 lb (100.2 kg)  08/31/23 221 lb (100.2 kg)    Physical Exam  Results for orders placed or performed in visit on 10/25/23  Lactate dehydrogenase   Collection Time: 10/25/23  2:12 PM  Result Value Ref Range   LDH 237 (H) 98 - 192 U/L  Kappa/lambda light chains   Collection Time: 10/25/23  2:12 PM  Result Value Ref Range   Kappa free light chain 10.9 3.3 - 19.4 mg/L   Lambda free light chains 55.7 (H) 5.7 - 26.3 mg/L   Kappa, lambda light chain ratio 0.20 (L) 0.26 - 1.65  C-reactive protein   Collection Time: 10/25/23  2:12 PM  Result Value Ref Range   CRP <0.5 <1.0 mg/dL  Multiple Myeloma Panel (SPEP&IFE w/QIG)   Collection Time: 10/25/23  2:12 PM  Result Value Ref Range   IgG (Immunoglobin G), Serum 2,379 (H) 603 - 1,613 mg/dL   IgA 56 (L) 61 - 469 mg/dL   IgM (Immunoglobulin M), Srm 18 15 - 143 mg/dL   Total Protein ELP 7.9 6.0 - 8.5 g/dL   Albumin SerPl Elph-Mcnc 4.0 2.9 - 4.4 g/dL    Alpha 1 0.2 0.0 - 0.4 g/dL   Alpha2 Glob SerPl Elph-Mcnc 0.5 0.4 - 1.0 g/dL   B-Globulin SerPl Elph-Mcnc 1.0 0.7 - 1.3 g/dL   Gamma Glob SerPl Elph-Mcnc 2.2 (H) 0.4 - 1.8 g/dL   M Protein SerPl Elph-Mcnc 1.7 (H) Not Observed g/dL   Globulin, Total 3.9 2.2 - 3.9 g/dL   Albumin/Glob SerPl 1.1 0.7 - 1.7   IFE 1 Comment (A)    Please Note Comment   CBC with Differential (Cancer Center Only)   Collection Time: 10/25/23  2:12 PM  Result Value Ref Range   WBC Count 2.5 (L) 4.0 - 10.5 K/uL   RBC 4.92 4.22 - 5.81 MIL/uL   Hemoglobin 13.8 13.0 - 17.0 g/dL   HCT 62.9 52.8 - 41.3 %   MCV 84.8 80.0 - 100.0 fL   MCH 28.0 26.0 - 34.0 pg   MCHC 33.1 30.0 - 36.0 g/dL   RDW 24.4 01.0 - 27.2 %   Platelet Count 124 (L) 150 - 400 K/uL   nRBC 0.0 0.0 - 0.2 %   Neutrophils Relative % 34 %   Neutro Abs 0.9 (  L) 1.7 - 7.7 K/uL   Lymphocytes Relative 51 %   Lymphs Abs 1.3 0.7 - 4.0 K/uL   Monocytes Relative 12 %   Monocytes Absolute 0.3 0.1 - 1.0 K/uL   Eosinophils Relative 3 %   Eosinophils Absolute 0.1 0.0 - 0.5 K/uL   Basophils Relative 0 %   Basophils Absolute 0.0 0.0 - 0.1 K/uL   Immature Granulocytes 0 %   Abs Immature Granulocytes 0.00 0.00 - 0.07 K/uL  CMP (Cancer Center only)   Collection Time: 10/25/23  2:12 PM  Result Value Ref Range   Sodium 139 135 - 145 mmol/L   Potassium 4.0 3.5 - 5.1 mmol/L   Chloride 108 98 - 111 mmol/L   CO2 27 22 - 32 mmol/L   Glucose, Bld 101 (H) 70 - 99 mg/dL   BUN 10 8 - 23 mg/dL   Creatinine 1.76 1.60 - 1.24 mg/dL   Calcium 9.3 8.9 - 73.7 mg/dL   Total Protein 8.2 (H) 6.5 - 8.1 g/dL   Albumin 4.2 3.5 - 5.0 g/dL   AST 36 15 - 41 U/L   ALT 38 0 - 44 U/L   Alkaline Phosphatase 57 38 - 126 U/L   Total Bilirubin 1.1 0.0 - 1.2 mg/dL   GFR, Estimated >10 >62 mL/min   Anion gap 4 (L) 5 - 15   {Labs (Optional):23779}    Assessment & Plan:   Problem List Items Addressed This Visit   None    Assessment and Plan             Follow up plan: No  follow-ups on file.

## 2023-11-19 ENCOUNTER — Ambulatory Visit: Payer: Medicare Other | Admitting: Nurse Practitioner

## 2023-11-19 ENCOUNTER — Encounter: Payer: Self-pay | Admitting: Nurse Practitioner

## 2023-11-19 VITALS — BP 142/72 | HR 65 | Temp 98.0°F | Resp 16 | Ht 72.0 in | Wt 224.6 lb

## 2023-11-19 DIAGNOSIS — I371 Nonrheumatic pulmonary valve insufficiency: Secondary | ICD-10-CM

## 2023-11-19 DIAGNOSIS — I340 Nonrheumatic mitral (valve) insufficiency: Secondary | ICD-10-CM

## 2023-11-19 DIAGNOSIS — R7309 Other abnormal glucose: Secondary | ICD-10-CM

## 2023-11-19 DIAGNOSIS — I10 Essential (primary) hypertension: Secondary | ICD-10-CM | POA: Diagnosis not present

## 2023-11-19 DIAGNOSIS — Z125 Encounter for screening for malignant neoplasm of prostate: Secondary | ICD-10-CM

## 2023-11-19 DIAGNOSIS — D696 Thrombocytopenia, unspecified: Secondary | ICD-10-CM

## 2023-11-19 DIAGNOSIS — I361 Nonrheumatic tricuspid (valve) insufficiency: Secondary | ICD-10-CM

## 2023-11-19 DIAGNOSIS — E782 Mixed hyperlipidemia: Secondary | ICD-10-CM

## 2023-11-19 DIAGNOSIS — R251 Tremor, unspecified: Secondary | ICD-10-CM

## 2023-11-19 DIAGNOSIS — E66811 Obesity, class 1: Secondary | ICD-10-CM

## 2023-11-20 LAB — COMPLETE METABOLIC PANEL WITH GFR
AG Ratio: 1.2 (calc) (ref 1.0–2.5)
ALT: 27 U/L (ref 9–46)
AST: 28 U/L (ref 10–35)
Albumin: 4.6 g/dL (ref 3.6–5.1)
Alkaline phosphatase (APISO): 62 U/L (ref 35–144)
BUN: 12 mg/dL (ref 7–25)
CO2: 27 mmol/L (ref 20–32)
Calcium: 9.2 mg/dL (ref 8.6–10.3)
Chloride: 104 mmol/L (ref 98–110)
Creat: 0.99 mg/dL (ref 0.70–1.22)
Globulin: 3.7 g/dL (ref 1.9–3.7)
Glucose, Bld: 92 mg/dL (ref 65–99)
Potassium: 4.2 mmol/L (ref 3.5–5.3)
Sodium: 137 mmol/L (ref 135–146)
Total Bilirubin: 1 mg/dL (ref 0.2–1.2)
Total Protein: 8.3 g/dL — ABNORMAL HIGH (ref 6.1–8.1)
eGFR: 77 mL/min/{1.73_m2} (ref >=60)

## 2023-11-20 LAB — CBC WITH DIFFERENTIAL/PLATELET
Absolute Lymphocytes: 1581 {cells}/uL (ref 850–3900)
Absolute Monocytes: 393 {cells}/uL (ref 200–950)
Basophils Absolute: 30 {cells}/uL (ref 0–200)
Basophils Relative: 0.9 %
Eosinophils Absolute: 50 {cells}/uL (ref 15–500)
Eosinophils Relative: 1.5 %
HCT: 44.8 % (ref 38.5–50.0)
Hemoglobin: 14.8 g/dL (ref 13.2–17.1)
MCH: 28.6 pg (ref 27.0–33.0)
MCHC: 33 g/dL (ref 32.0–36.0)
MCV: 86.5 fL (ref 80.0–100.0)
MPV: 11.9 fL (ref 7.5–12.5)
Monocytes Relative: 11.9 %
Neutro Abs: 1247 {cells}/uL — ABNORMAL LOW (ref 1500–7800)
Neutrophils Relative %: 37.8 %
Platelets: 136 10*3/uL — ABNORMAL LOW (ref 140–400)
RBC: 5.18 10*6/uL (ref 4.20–5.80)
RDW: 12.9 % (ref 11.0–15.0)
Total Lymphocyte: 47.9 %
WBC: 3.3 10*3/uL — ABNORMAL LOW (ref 3.8–10.8)

## 2023-11-20 LAB — HEMOGLOBIN A1C
Hgb A1c MFr Bld: 6.2 %{Hb} — ABNORMAL HIGH (ref <5.7)
Mean Plasma Glucose: 131 mg/dL
eAG (mmol/L): 7.3 mmol/L

## 2023-11-20 LAB — PSA: PSA: 2.17 ng/mL (ref <=4.00)

## 2023-11-20 LAB — LIPID PANEL
Cholesterol: 145 mg/dL (ref <200)
HDL: 56 mg/dL (ref >=40)
LDL Cholesterol (Calc): 76 mg/dL
Non-HDL Cholesterol (Calc): 89 mg/dL (ref <130)
Total CHOL/HDL Ratio: 2.6 (calc) (ref <5.0)
Triglycerides: 54 mg/dL (ref <150)

## 2023-11-22 ENCOUNTER — Encounter: Payer: Self-pay | Admitting: Nurse Practitioner

## 2023-11-23 ENCOUNTER — Ambulatory Visit: Payer: Medicare Other | Admitting: Radiology

## 2023-11-24 ENCOUNTER — Other Ambulatory Visit: Payer: Medicare Other

## 2023-11-24 ENCOUNTER — Ambulatory Visit: Payer: Medicare Other | Admitting: Internal Medicine

## 2023-11-30 ENCOUNTER — Telehealth: Payer: Self-pay | Admitting: Cardiovascular Disease

## 2023-11-30 DIAGNOSIS — I10 Essential (primary) hypertension: Secondary | ICD-10-CM

## 2023-11-30 MED ORDER — AMLODIPINE BESYLATE 10 MG PO TABS
10.0000 mg | ORAL_TABLET | Freq: Every day | ORAL | 1 refills | Status: DC
Start: 2023-11-30 — End: 2024-05-23

## 2023-11-30 NOTE — Telephone Encounter (Signed)
*  STAT* If patient is at the pharmacy, call can be transferred to refill team.   1. Which medications need to be refilled? (please list name of each medication and dose if known)   amLODipine (NORVASC) 10 MG tablet    2. Which pharmacy/location (including street and city if local pharmacy) is medication to be sent to? [id number] [place], [place] - [address] Phone: [phone]  Fax: [phone]      3. Do they need a 30 day or 90 day supply? 30

## 2023-12-13 ENCOUNTER — Other Ambulatory Visit: Payer: Self-pay | Admitting: Nurse Practitioner

## 2023-12-13 DIAGNOSIS — G8929 Other chronic pain: Secondary | ICD-10-CM

## 2023-12-14 NOTE — Telephone Encounter (Signed)
 Requested Prescriptions  Pending Prescriptions Disp Refills   gabapentin (NEURONTIN) 300 MG capsule [Pharmacy Med Name: GABAPENTIN CAP 300MG (N)] 270 capsule 1    Sig: TAKE 1 CAPSULE THREE TIMES A DAY     Neurology: Anticonvulsants - gabapentin Passed - 12/14/2023 10:53 AM      Passed - Cr in normal range and within 360 days    Creat  Date Value Ref Range Status  11/19/2023 0.99 0.70 - 1.22 mg/dL Final         Passed - Completed PHQ-2 or PHQ-9 in the last 360 days      Passed - Valid encounter within last 12 months    Recent Outpatient Visits           4 months ago Unintentional weight loss   [facility] Berniece Salines, FNP   6 months ago Essential hypertension   [facility] Berniece Salines, FNP   1 year ago Essential hypertension   [facility] Berniece Salines, FNP   1 year ago Upper respiratory tract infection, unspecified type   [facility] Danelle Berry, PA-C   1 year ago Essential hypertension   [facility] Berniece Salines, FNP       Future Appointments             In 5 months Zane Herald, Rudolpho Sevin, FNP [facility], [facility]

## 2024-01-21 ENCOUNTER — Ambulatory Visit: Payer: Medicare Other | Admitting: Internal Medicine

## 2024-01-21 ENCOUNTER — Other Ambulatory Visit: Payer: Medicare Other

## 2024-02-17 ENCOUNTER — Ambulatory Visit: Payer: Medicare Other

## 2024-02-26 ENCOUNTER — Other Ambulatory Visit: Payer: Self-pay | Admitting: Cardiovascular Disease

## 2024-02-26 DIAGNOSIS — I10 Essential (primary) hypertension: Secondary | ICD-10-CM

## 2024-04-20 ENCOUNTER — Telehealth: Payer: Self-pay | Admitting: Internal Medicine

## 2024-04-20 ENCOUNTER — Other Ambulatory Visit: Payer: Self-pay | Admitting: *Deleted

## 2024-04-20 DIAGNOSIS — D696 Thrombocytopenia, unspecified: Secondary | ICD-10-CM

## 2024-04-20 DIAGNOSIS — D472 Monoclonal gammopathy: Secondary | ICD-10-CM

## 2024-04-20 NOTE — Telephone Encounter (Signed)
 Pt spouse called and said that they want to set up an appt with Dr B. Didn't give any details as to what the appt would be for. Pt last seen by Dr B on 11/05/23. LOS states # Bone marrow ASAP  # follow up in 1 week after Bone marrow Bx- - MD; labs- None- -Dr.B. Bone Marrow was canceled because pt wanted to think it over - never rescheduled. Following appts were canceled since they were follow-ups post-Bone Marrow. Messaged clinical team. - LH

## 2024-05-15 ENCOUNTER — Inpatient Hospital Stay

## 2024-05-15 ENCOUNTER — Inpatient Hospital Stay: Admitting: Internal Medicine

## 2024-05-18 ENCOUNTER — Inpatient Hospital Stay: Attending: Nurse Practitioner

## 2024-05-18 ENCOUNTER — Inpatient Hospital Stay: Admitting: Internal Medicine

## 2024-05-18 ENCOUNTER — Ambulatory Visit: Payer: Medicare Other | Admitting: Nurse Practitioner

## 2024-05-18 ENCOUNTER — Encounter: Payer: Self-pay | Admitting: Internal Medicine

## 2024-05-22 ENCOUNTER — Other Ambulatory Visit: Payer: Self-pay | Admitting: Cardiovascular Disease

## 2024-05-22 DIAGNOSIS — I10 Essential (primary) hypertension: Secondary | ICD-10-CM

## 2024-06-10 ENCOUNTER — Other Ambulatory Visit: Payer: Self-pay | Admitting: Nurse Practitioner

## 2024-06-10 DIAGNOSIS — G8929 Other chronic pain: Secondary | ICD-10-CM

## 2024-06-12 NOTE — Telephone Encounter (Signed)
 Requested Prescriptions  Pending Prescriptions Disp Refills   gabapentin  (NEURONTIN ) 300 MG capsule [Pharmacy Med Name: GABAPENTIN  CAP 300MG ] 270 capsule 1    Sig: TAKE 1 CAPSULE THREE TIMES A DAY     Neurology: Anticonvulsants - gabapentin  Passed - 06/12/2024 11:38 AM      Passed - Cr in normal range and within 360 days    Creat  Date Value Ref Range Status  11/19/2023 0.99 0.70 - 1.22 mg/dL Final         Passed - Completed PHQ-2 or PHQ-9 in the last 360 days      Passed - Valid encounter within last 12 months    Recent Outpatient Visits           6 months ago Essential hypertension   [facility] Gareth Mliss FALCON, [place]

## 2024-06-14 ENCOUNTER — Encounter: Payer: Self-pay | Admitting: Nurse Practitioner

## 2024-06-14 ENCOUNTER — Ambulatory Visit: Admitting: Nurse Practitioner

## 2024-06-14 VITALS — BP 126/80 | HR 69 | Temp 97.8°F | Resp 18 | Ht 72.0 in | Wt 210.3 lb

## 2024-06-14 DIAGNOSIS — I371 Nonrheumatic pulmonary valve insufficiency: Secondary | ICD-10-CM

## 2024-06-14 DIAGNOSIS — Z23 Encounter for immunization: Secondary | ICD-10-CM | POA: Diagnosis not present

## 2024-06-14 DIAGNOSIS — D696 Thrombocytopenia, unspecified: Secondary | ICD-10-CM

## 2024-06-14 DIAGNOSIS — E782 Mixed hyperlipidemia: Secondary | ICD-10-CM

## 2024-06-14 DIAGNOSIS — I10 Essential (primary) hypertension: Secondary | ICD-10-CM

## 2024-06-14 DIAGNOSIS — I340 Nonrheumatic mitral (valve) insufficiency: Secondary | ICD-10-CM | POA: Diagnosis not present

## 2024-06-14 DIAGNOSIS — R7303 Prediabetes: Secondary | ICD-10-CM

## 2024-06-14 DIAGNOSIS — R634 Abnormal weight loss: Secondary | ICD-10-CM

## 2024-06-14 DIAGNOSIS — G250 Essential tremor: Secondary | ICD-10-CM

## 2024-06-14 DIAGNOSIS — I361 Nonrheumatic tricuspid (valve) insufficiency: Secondary | ICD-10-CM

## 2024-06-14 NOTE — Progress Notes (Signed)
 BP 126/80   Pulse 69   Temp 97.8 F (36.6 C)   Resp 18   Ht 6' (1.829 m)   Wt 210 lb 4.8 oz (95.4 kg)   SpO2 99%   BMI 28.52 kg/m    Subjective:    [name], male    DOB: August 11, 1941, 83 y.o.   MRN: [id number]  HPI: Jesus Dillon is a 83 y.o. male  Chief Complaint  Patient presents with   Medical Management of Chronic Issues   Discussed the use of AI scribe software for clinical note transcription with the patient, who gave verbal consent to proceed.  History of Present Illness Jesus Dillon is an 83 year old male with hypertension who presents with elevated blood pressure and unintended weight loss.  Elevated blood pressure - Elevated blood pressure attributed to missing antihypertensive medication - Ran out of medication but has recently resumed taking it - Current antihypertensive regimen: amlodipine  10 mg daily and telmisartan  80 mg daily  Unintentional weight loss - Unintended weight loss from 224 pounds to 210 pounds - wife is more concerned about the weight loss than he is -previously started weight loss workup back in November 2024 - No blood in stool  Thrombocytopenia - History of thrombocytopenia - Currently followed by oncology  Cardiac history - History of mitral regurgitation, pulmonary regurgitation, and tricuspid regurgitation  Hyperlipidemia - Current medication: atorvastatin  40 mg daily  Prediabetes - Last hemoglobin A1c was 6.2  Benign tremor - Current medication: propranolol  10 mg three times daily         06/14/2024    3:18 PM 07/29/2023    1:38 PM 05/21/2023    1:52 PM  Depression screen PHQ 2/9  Decreased Interest 0 0 0  Down, Depressed, Hopeless 0 0 0  PHQ - 2 Score 0 0 0  Altered sleeping 0    Tired, decreased energy 0    Change in appetite 0    Feeling bad or failure about yourself  0    Trouble concentrating 0    Moving slowly or fidgety/restless 0    Suicidal thoughts 0    PHQ-9 Score 0    Difficult  doing work/chores Not difficult at all      Relevant past medical, surgical, family and social history reviewed and updated as indicated. Interim medical history since our last visit reviewed. Allergies and medications reviewed and updated.  Review of Systems  Constitutional: Negative for fever or weight change.  Respiratory: Negative for cough and shortness of breath.   Cardiovascular: Negative for chest pain or palpitations.  Gastrointestinal: Negative for abdominal pain, no bowel changes.  Musculoskeletal: Negative for gait problem or joint swelling.  Skin: Negative for rash.  Neurological: Negative for dizziness or headache.  No other specific complaints in a complete review of systems (except as listed in HPI above).      Objective:      BP 126/80   Pulse 69   Temp 97.8 F (36.6 C)   Resp 18   Ht 6' (1.829 m)   Wt 210 lb 4.8 oz (95.4 kg)   SpO2 99%   BMI 28.52 kg/m    Wt Readings from Last 3 Encounters:  06/14/24 210 lb 4.8 oz (95.4 kg)  11/19/23 224 lb 9.6 oz (101.9 kg)  11/05/23 225 lb (102.1 kg)    Physical Exam MEASUREMENTS: Weight- 210. GENERAL: Alert, cooperative, well developed, no acute distress HEENT: Normocephalic, normal oropharynx, moist mucous  membranes CHEST: Clear to auscultation bilaterally, No wheezes, rhonchi, or crackles CARDIOVASCULAR: Normal heart rate and rhythm, S1 and S2 normal without murmurs ABDOMEN: Soft, non-tender, non-distended, without organomegaly, Normal bowel sounds EXTREMITIES: No cyanosis or edema NEUROLOGICAL: Cranial nerves grossly intact, Moves all extremities without gross motor or sensory deficit  Results for orders placed or performed in visit on 11/19/23  CBC with Differential/Platelet   Collection Time: 11/19/23  3:26 PM  Result Value Ref Range   WBC 3.3 (L) 3.8 - 10.8 Thousand/uL   RBC 5.18 4.20 - 5.80 Million/uL   Hemoglobin 14.8 13.2 - 17.1 g/dL   HCT 55.1 61.4 - 49.9 %   MCV 86.5 80.0 - 100.0 fL   MCH 28.6  27.0 - 33.0 pg   MCHC 33.0 32.0 - 36.0 g/dL   RDW 87.0 88.9 - 84.9 %   Platelets 136 (L) 140 - 400 Thousand/uL   MPV 11.9 7.5 - 12.5 fL   Neutro Abs 1,247 (L) 1,500 - 7,800 cells/uL   Absolute Lymphocytes 1,581 850 - 3,900 cells/uL   Absolute Monocytes 393 200 - 950 cells/uL   Eosinophils Absolute 50 15 - 500 cells/uL   Basophils Absolute 30 0 - 200 cells/uL   Neutrophils Relative % 37.8 %   Total Lymphocyte 47.9 %   Monocytes Relative 11.9 %   Eosinophils Relative 1.5 %   Basophils Relative 0.9 %  COMPLETE METABOLIC PANEL WITH GFR   Collection Time: 11/19/23  3:26 PM  Result Value Ref Range   Glucose, Bld 92 65 - 99 mg/dL   BUN 12 7 - 25 mg/dL   Creat 9.00 9.29 - 8.77 mg/dL   eGFR 77 > OR = 60 fO/fpw/8.26f7   BUN/Creatinine Ratio SEE NOTE: 6 - 22 (calc)   Sodium 137 135 - 146 mmol/L   Potassium 4.2 3.5 - 5.3 mmol/L   Chloride 104 98 - 110 mmol/L   CO2 27 20 - 32 mmol/L   Calcium  9.2 8.6 - 10.3 mg/dL   Total Protein 8.3 (H) 6.1 - 8.1 g/dL   Albumin 4.6 3.6 - 5.1 g/dL   Globulin 3.7 1.9 - 3.7 g/dL (calc)   AG Ratio 1.2 1.0 - 2.5 (calc)   Total Bilirubin 1.0 0.2 - 1.2 mg/dL   Alkaline phosphatase (APISO) 62 35 - 144 U/L   AST 28 10 - 35 U/L   ALT 27 9 - 46 U/L  Lipid panel   Collection Time: 11/19/23  3:26 PM  Result Value Ref Range   Cholesterol 145 <200 mg/dL   HDL 56 > OR = 40 mg/dL   Triglycerides 54 <849 mg/dL   LDL Cholesterol (Calc) 76 mg/dL (calc)   Total CHOL/HDL Ratio 2.6 <5.0 (calc)   Non-HDL Cholesterol (Calc) 89 <869 mg/dL (calc)  Hemoglobin J8r   Collection Time: 11/19/23  3:26 PM  Result Value Ref Range   Hgb A1c MFr Bld 6.2 (H) <5.7 % of total Hgb   Mean Plasma Glucose 131 mg/dL   eAG (mmol/L) 7.3 mmol/L  PSA   Collection Time: 11/19/23  3:26 PM  Result Value Ref Range   PSA 2.17 < OR = 4.00 ng/mL          Assessment & Plan:   Problem List Items Addressed This Visit       Cardiovascular and Mediastinum   Essential hypertension    Relevant Orders   CBC with Differential/Platelet   Comprehensive metabolic panel with GFR   Mitral regurgitation   Tricuspid regurgitation  Pulmonary regurgitation     Nervous and Auditory   Benign essential tremor     Hematopoietic and Hemostatic   Thrombocytopenia   Relevant Orders   CBC with Differential/Platelet     Other   Hyperlipidemia (Chronic)   Relevant Orders   Comprehensive metabolic panel with GFR   Lipid panel   Prediabetes   Relevant Orders   Hemoglobin A1c   Other Visit Diagnoses       Unintentional weight loss    -  Primary   Relevant Orders   DG Chest 2 View   TSH     Immunization due       Relevant Orders   Flu vaccine HIGH DOSE PF(Fluzone Trivalent) (Completed)        Assessment and Plan Assessment & Plan Unintentional weight loss Unintentional weight loss from 224 lbs to 210 lbs. Differential diagnosis includes malignancy and insufficient protein intake leading to muscle mass loss. Previous workup including blood work was normal except for low white blood cell count and thrombocytopenia. No blood in stool reported. Chest x-ray not completed previously. Discussion about the potential need for a bone marrow test to further investigate the cause of weight loss and low blood counts. - Order chest x-ray - Provide stool cards for fecal occult blood test - Encourage increased protein intake with Ensure or Boost, or clear protein drinks without sugar - Follow up with oncology for further evaluation, including potential bone marrow test  Thrombocytopenia Thrombocytopenia with low platelet count noted in previous blood work. Currently under oncology care for further evaluation. - Follow up with oncology for ongoing management  Hypertension Hypertension likely due to missed antihypertensive medication. Blood pressure was elevated during the visit. Medication adherence issue due to personal circumstances. Prescription for antihypertensive medication has  been refilled. - Ensure prescription for antihypertensive medication is filled - Reinforce importance of medication adherence - Recheck blood pressure during the visit  HLD -continue atorvastatin  10 mg daily -recheck lipid panel   Mitral/pulmonary/tricuspid regurgitation -managed by cardiology -stable  General Health Maintenance Discussion of flu vaccination status. Uncertain if flu shot was received during recent rehab stay. - Administer flu shot if not already received        Follow up plan: Return in about 6 months (around 12/12/2024) for follow up.

## 2024-06-15 ENCOUNTER — Ambulatory Visit: Payer: Self-pay | Admitting: Nurse Practitioner

## 2024-06-15 LAB — COMPREHENSIVE METABOLIC PANEL WITH GFR
AG Ratio: 1.3 (calc) (ref 1.0–2.5)
ALT: 46 U/L (ref 9–46)
AST: 40 U/L — ABNORMAL HIGH (ref 10–35)
Albumin: 4.4 g/dL (ref 3.6–5.1)
Alkaline phosphatase (APISO): 57 U/L (ref 35–144)
BUN: 12 mg/dL (ref 7–25)
CO2: 27 mmol/L (ref 20–32)
Calcium: 9.6 mg/dL (ref 8.6–10.3)
Chloride: 104 mmol/L (ref 98–110)
Creat: 0.97 mg/dL (ref 0.70–1.22)
Globulin: 3.4 g/dL (ref 1.9–3.7)
Glucose, Bld: 91 mg/dL (ref 65–99)
Potassium: 4.2 mmol/L (ref 3.5–5.3)
Sodium: 138 mmol/L (ref 135–146)
Total Bilirubin: 0.9 mg/dL (ref 0.2–1.2)
Total Protein: 7.8 g/dL (ref 6.1–8.1)
eGFR: 78 mL/min/1.73m2 (ref >=60)

## 2024-06-15 LAB — CBC WITH DIFFERENTIAL/PLATELET
Absolute Lymphocytes: 1282 {cells}/uL (ref 850–3900)
Absolute Monocytes: 336 {cells}/uL (ref 200–950)
Basophils Absolute: 20 {cells}/uL (ref 0–200)
Basophils Relative: 0.7 %
Eosinophils Absolute: 49 {cells}/uL (ref 15–500)
Eosinophils Relative: 1.7 %
HCT: 41.9 % (ref 38.5–50.0)
Hemoglobin: 13.8 g/dL (ref 13.2–17.1)
MCH: 29.1 pg (ref 27.0–33.0)
MCHC: 32.9 g/dL (ref 32.0–36.0)
MCV: 88.4 fL (ref 80.0–100.0)
MPV: 11.7 fL (ref 7.5–12.5)
Monocytes Relative: 11.6 %
Neutro Abs: 1212 {cells}/uL — ABNORMAL LOW (ref 1500–7800)
Neutrophils Relative %: 41.8 %
Platelets: 126 Thousand/uL — ABNORMAL LOW (ref 140–400)
RBC: 4.74 Million/uL (ref 4.20–5.80)
RDW: 13.6 % (ref 11.0–15.0)
Total Lymphocyte: 44.2 %
WBC: 2.9 Thousand/uL — ABNORMAL LOW (ref 3.8–10.8)

## 2024-06-15 LAB — HEMOGLOBIN A1C
Hgb A1c MFr Bld: 6 % — ABNORMAL HIGH (ref <5.7)
Mean Plasma Glucose: 126 mg/dL
eAG (mmol/L): 7 mmol/L

## 2024-06-15 LAB — LIPID PANEL
Cholesterol: 153 mg/dL (ref <200)
HDL: 60 mg/dL (ref >=40)
LDL Cholesterol (Calc): 80 mg/dL
Non-HDL Cholesterol (Calc): 93 mg/dL (ref <130)
Total CHOL/HDL Ratio: 2.6 (calc) (ref <5.0)
Triglycerides: 57 mg/dL (ref <150)

## 2024-06-15 LAB — TSH: TSH: 1.24 m[IU]/L (ref 0.40–4.50)

## 2024-06-23 ENCOUNTER — Ambulatory Visit: Payer: Self-pay

## 2024-06-23 NOTE — Telephone Encounter (Signed)
 FYI Only or Action Required?: FYI only for provider.  Patient was last seen in primary care on 06/14/2024 by Gareth Mliss FALCON, FNP.  Called Nurse Triage reporting Advice Only.  Symptoms began None.  Interventions attempted: Other: N/A.  Symptoms are: stable.  Triage Disposition: Information or Advice Only Call  Patient/caregiver understands and will follow disposition?: Yes  Wife will have pt follow-up with hematology/oncology as planned.  Reason for Disposition  Health information question, no triage required and triager able to answer question  Answer Assessment - Initial Assessment Questions 1. REASON FOR CALL: What is the main reason for your call? or How can I best help you?     Questions about lab work  2. SYMPTOMS : Do you have any symptoms?      No  3. OTHER QUESTIONS: Do you have any other questions?     No  Protocols used: Information Only Call - No Triage-A-AH

## 2024-07-27 ENCOUNTER — Telehealth: Payer: Self-pay | Admitting: Internal Medicine

## 2024-07-27 NOTE — Telephone Encounter (Signed)
 Called to reschedule appointment due to provider being out of office. No answer left voicemail. Mailed new appointment to patient. Asked him to call back if appointments didn't work for him.

## 2024-08-01 ENCOUNTER — Ambulatory Visit: Admitting: Cardiovascular Disease

## 2024-08-14 ENCOUNTER — Inpatient Hospital Stay: Admitting: Internal Medicine

## 2024-08-14 ENCOUNTER — Inpatient Hospital Stay: Admitting: Nurse Practitioner

## 2024-08-14 ENCOUNTER — Telehealth: Payer: Self-pay | Admitting: Nurse Practitioner

## 2024-08-14 ENCOUNTER — Inpatient Hospital Stay

## 2024-08-14 NOTE — Telephone Encounter (Signed)
 Pt called to r/s appts from today, pt stated his wife is not well and someone has to be with her. Appts are r/s to next week and confirmed with pt

## 2024-08-15 ENCOUNTER — Encounter: Payer: Self-pay | Admitting: Cardiovascular Disease

## 2024-08-15 ENCOUNTER — Ambulatory Visit: Attending: Cardiovascular Disease | Admitting: Cardiovascular Disease

## 2024-08-15 VITALS — BP 122/60 | HR 60 | Ht 71.5 in | Wt 214.0 lb

## 2024-08-15 DIAGNOSIS — I340 Nonrheumatic mitral (valve) insufficiency: Secondary | ICD-10-CM

## 2024-08-15 DIAGNOSIS — I10 Essential (primary) hypertension: Secondary | ICD-10-CM

## 2024-08-15 DIAGNOSIS — E782 Mixed hyperlipidemia: Secondary | ICD-10-CM | POA: Diagnosis not present

## 2024-08-15 DIAGNOSIS — I361 Nonrheumatic tricuspid (valve) insufficiency: Secondary | ICD-10-CM

## 2024-08-15 MED ORDER — AMLODIPINE BESYLATE 10 MG PO TABS: 10.0000 mg | ORAL_TABLET | Freq: Every day | ORAL | 1 refills | Status: AC

## 2024-08-15 MED ORDER — TELMISARTAN 80 MG PO TABS: 80.0000 mg | ORAL_TABLET | Freq: Every day | ORAL | 3 refills | Status: DC

## 2024-08-15 NOTE — Patient Instructions (Signed)

## 2024-08-15 NOTE — Progress Notes (Signed)
 Cardiology Office Note  Date:  08/15/2024   ID:  [name], [name] 1941/05/24, MRN [id number]  PCP:  Gareth Mliss FALCON, FNP   Chief Complaint  Patient presents with   12 month follow up     Denies chest pain or shortness of breath.     HPI:  [name] is a 83 year old gentleman with past medical history of Nonsmoker Prediabetes HTN Hyperlipidemia aortic valve sclerosis Benign essential tremor  Who presents for f/u of his  mitral valve regurgitation, exercise intolerance  Seen by myself in clinic 8/24 Losing weight, 8 pounds since last clinic visit in 2024 Weight 214 today Family concerned about his weight loss, loss of muscle, they are buying him supplement drinks He feels he is doing well, feels less hungry Denies any pain, no shortness of breath on exertion, remains active  No significant leg edema, no PND orthopnea No regular exercise program Sedentary  Mains on norvasc  10 daily, micardis  80 mg daily  Lab work reviewed Total chol 153, LDL 80 A1C 6.0  EKG personally reviewed by myself on todays visit EKG Interpretation Date/Time:  Tuesday August 15 2024 16:36:27 EST Ventricular Rate:  60 PR Interval:  250 QRS Duration:  94 QT Interval:  388 QTC Calculation: 388 R Axis:   43  Text Interpretation: Sinus rhythm with 1st degree A-V block Nonspecific T wave abnormality When compared with ECG of 04-May-2023 16:33, No significant change was found Confirmed by Perla Lye [phone]) on 08/15/2024 4:57:59 PM   Other past medical history Stress test Myoview  January 12, 2018 normal ejection fraction low risk study  Echocardiogram September 2019 normal ejection fraction mild to moderate mitral valve regurgitation Mild to moderately dilated left atrium  PMH:   has a past medical history of Arthritis, Diastolic dysfunction (06/17/2018), GERD (gastroesophageal reflux disease), Gout, HBP (high blood pressure), Heart murmur, Hyperlipidemia, Left atrial dilation  (06/17/2018), Mitral regurgitation (06/17/2018), Pulmonary regurgitation (06/17/2018), and Tremors of nervous system.  PSH:    Past Surgical History:  Procedure Laterality Date   ANAL FISSURECTOMY     COLONOSCOPY WITH PROPOFOL  N/A 08/25/2018   Procedure: COLONOSCOPY WITH PROPOFOL ;  Surgeon: Therisa Bi, MD;  Location: [facility];  Service: Gastroenterology;  Laterality: N/A;   HYDROCELE EXCISION Left 03/01/2018   Procedure: HYDROCELECTOMY ADULT;  Surgeon: Twylla Glendia BROCKS, MD;  Location: [facility];  Service: Urology;  Laterality: Left;    Current Outpatient Medications  Medication Sig Dispense Refill   acetaminophen  (TYLENOL ) 650 MG CR tablet Take 650 mg by mouth every 8 (eight) hours as needed for pain.     amLODipine  (NORVASC ) 10 MG tablet TAKE 1 TABLET BY MOUTH EVERY DAY 90 tablet 1   atorvastatin  (LIPITOR) 40 MG tablet Take 1 tablet (40 mg total) by mouth at bedtime. 90 tablet 3   gabapentin  (NEURONTIN ) 300 MG capsule TAKE 1 CAPSULE THREE TIMES A DAY 270 capsule 1   propranolol  (INDERAL ) 10 MG tablet Take 1 tablet (10 mg total) by mouth 3 (three) times daily. 270 tablet 3   telmisartan  (MICARDIS ) 80 MG tablet Take 1 tablet (80 mg total) by mouth daily. 90 tablet 3   No current facility-administered medications for this visit.    Allergies:   Celecoxib and Penicillins   Social History:  The patient  reports that he has never smoked. He has never used smokeless tobacco. He reports that he does not drink alcohol and does not use drugs.   Family History:   family history includes Angina  in his mother; Blindness in his maternal grandmother; Dementia in his mother; Diabetes in his brother; Hypertension in his brother and mother.   Review of Systems: Review of Systems  Constitutional:  Positive for weight loss.  HENT: Negative.    Respiratory: Negative.    Cardiovascular: Negative.   Gastrointestinal: Negative.   Musculoskeletal: Negative.   Neurological: Negative.    Psychiatric/Behavioral: Negative.    All other systems reviewed and are negative.  PHYSICAL EXAM: VS:  BP 122/60 (BP Location: Left Arm, Patient Position: Sitting, Cuff Size: Normal)   Pulse 60   Ht 5' 11.5 (1.816 m)   Wt 214 lb (97.1 kg)   SpO2 100%   BMI 29.43 kg/m  , BMI Body mass index is 29.43 kg/m. GEN: Well nourished, well developed, in no acute distress  HEENT: normal  Neck: no JVD, carotid bruits, or masses Cardiac: RRR; no murmurs, rubs, or gallops,no edema  Respiratory:  clear to auscultation bilaterally, normal work of breathing GI: soft, nontender, nondistended, + BS MS: no deformity or atrophy  Skin: warm and dry, no rash Neuro:  Strength and sensation are intact Psych: euthymic mood, full affect  Recent Labs: 06/14/2024: ALT 46; BUN 12; Creat 0.97; Hemoglobin 13.8; Platelets 126; Potassium 4.2; Sodium 138; TSH 1.24    Lipid Panel Lab Results  Component Value Date   CHOL 153 06/14/2024   HDL 60 06/14/2024   LDLCALC 80 06/14/2024   TRIG 57 06/14/2024      Wt Readings from Last 3 Encounters:  08/15/24 214 lb (97.1 kg)  06/14/24 210 lb 4.8 oz (95.4 kg)  11/19/23 224 lb 9.6 oz (101.9 kg)     ASSESSMENT AND PLAN:  Leg edema No significant edema on today's visit, tolerating amlodipine   Chest pain with moderate risk for cardiac etiology -  Denies chest pain concerning for angina No further workup at this time. Continue current medication regimen.  Cardiac murmur -  Minimal murmur on exam, aortic valve sclerosis without significant stenosis  Pure hypercholesterolemia Continue statin, Lipitor 40 daily  Essential hypertension Continue amlodipine  and Micardis  at current dosing Blood pressure is well controlled on today's visit. No changes made to the medications.  Shortness of breath -  Previously worked up with stress test Recommend regular walking program, weight trending downward    Orders Placed This Encounter  Procedures   EKG 12-Lead      Signed, Velinda Lunger, M.D., Ph.D. 08/15/2024  [facility] [place], [place] [phone]

## 2024-08-16 ENCOUNTER — Telehealth: Payer: Self-pay | Admitting: Cardiovascular Disease

## 2024-08-16 DIAGNOSIS — I10 Essential (primary) hypertension: Secondary | ICD-10-CM

## 2024-08-16 MED ORDER — TELMISARTAN 80 MG PO TABS: 80.0000 mg | ORAL_TABLET | Freq: Every day | ORAL | 0 refills | Status: AC

## 2024-08-16 NOTE — Telephone Encounter (Signed)
 Called and left message per DPR. Prescription called in per request. Asked patient to call back with any questions.

## 2024-08-16 NOTE — Telephone Encounter (Signed)
 Pt c/o medication issue:  1. Name of Medication: telmisartan  (MICARDIS ) 80 MG tablet   2. How are you currently taking this medication (dosage and times per day)? Take 1 tablet (80 mg total) by mouth daily.   3. Are you having a reaction (difficulty breathing--STAT)? No 4. What is your medication issue? Patient's wife stated that when the patient was seen yesterday, he did not realize he was completely out of medication. We sent the refill to [facility], but patient's wife is requesting for us  to [facility] [address] [place], [place] [address]. Please advise.

## 2024-08-22 ENCOUNTER — Inpatient Hospital Stay: Admitting: Nurse Practitioner

## 2024-08-22 ENCOUNTER — Inpatient Hospital Stay

## 2024-12-14 ENCOUNTER — Ambulatory Visit: Admitting: Nurse Practitioner
